# Patient Record
Sex: Female | Born: 1950 | Hispanic: Yes | Smoking: Current every day smoker
Health system: Southern US, Community
[De-identification: ages and names within clinical notes are randomized; demographics above are authoritative.]

## PROBLEM LIST (undated history)

## (undated) DIAGNOSIS — E669 Obesity, unspecified: Secondary | ICD-10-CM

## (undated) DIAGNOSIS — Z972 Presence of dental prosthetic device (complete) (partial): Secondary | ICD-10-CM

## (undated) DIAGNOSIS — I10 Essential (primary) hypertension: Secondary | ICD-10-CM

## (undated) DIAGNOSIS — D492 Neoplasm of unspecified behavior of bone, soft tissue, and skin: Secondary | ICD-10-CM

## (undated) DIAGNOSIS — D72829 Elevated white blood cell count, unspecified: Secondary | ICD-10-CM

## (undated) DIAGNOSIS — E785 Hyperlipidemia, unspecified: Secondary | ICD-10-CM

## (undated) HISTORY — PX: OOPHORECTOMY: SHX86

## (undated) HISTORY — DX: Elevated white blood cell count, unspecified: D72.829

## (undated) HISTORY — DX: Essential (primary) hypertension: I10

## (undated) HISTORY — PX: ABDOMINAL HYSTERECTOMY: SHX81

## (undated) HISTORY — DX: Hyperlipidemia, unspecified: E78.5

---

## 2001-05-17 HISTORY — PX: TOTAL VAGINAL HYSTERECTOMY: SHX2548

## 2004-09-14 HISTORY — PX: COLONOSCOPY: SHX174

## 2005-09-28 LAB — HM COLONOSCOPY: HM Colonoscopy: NORMAL

## 2009-10-04 ENCOUNTER — Ambulatory Visit: Payer: Self-pay | Admitting: Family Medicine

## 2009-10-04 DIAGNOSIS — I10 Essential (primary) hypertension: Secondary | ICD-10-CM

## 2009-10-05 LAB — CONVERTED CEMR LAB
ALT: 15 units/L (ref 0–35)
AST: 14 units/L (ref 0–37)
Albumin: 4.8 g/dL (ref 3.5–5.2)
Alkaline Phosphatase: 83 units/L (ref 39–117)
BUN: 12 mg/dL (ref 6–23)
Basophils Absolute: 0 10*3/uL (ref 0.0–0.1)
Basophils Relative: 0 % (ref 0–1)
CO2: 22 meq/L (ref 19–32)
Calcium: 9.6 mg/dL (ref 8.4–10.5)
Chloride: 105 meq/L (ref 96–112)
Cholesterol: 249 mg/dL — ABNORMAL HIGH (ref 0–200)
Creatinine, Ser: 0.66 mg/dL (ref 0.40–1.20)
Eosinophils Absolute: 0.2 10*3/uL (ref 0.0–0.7)
Eosinophils Relative: 2 % (ref 0–5)
Glucose, Bld: 92 mg/dL (ref 70–99)
HCT: 40 % (ref 36.0–46.0)
HDL: 42 mg/dL (ref >39)
Hemoglobin: 13.3 g/dL (ref 12.0–15.0)
LDL Cholesterol: 166 mg/dL — ABNORMAL HIGH (ref 0–99)
Lymphocytes Relative: 25 % (ref 12–46)
Lymphs Abs: 3.3 10*3/uL (ref 0.7–4.0)
MCHC: 33.3 g/dL (ref 30.0–36.0)
MCV: 85.3 fL (ref 78.0–100.0)
Monocytes Absolute: 0.9 10*3/uL (ref 0.1–1.0)
Monocytes Relative: 7 % (ref 3–12)
Neutro Abs: 8.5 10*3/uL — ABNORMAL HIGH (ref 1.7–7.7)
Neutrophils Relative %: 66 % (ref 43–77)
Platelets: 337 10*3/uL (ref 150–400)
Potassium: 3.8 meq/L (ref 3.5–5.3)
RBC: 4.69 M/uL (ref 3.87–5.11)
RDW: 15.8 % — ABNORMAL HIGH (ref 11.5–15.5)
Sodium: 139 meq/L (ref 135–145)
TSH: 1.325 microintl units/mL (ref 0.350–4.500)
Total Bilirubin: 0.3 mg/dL (ref 0.3–1.2)
Total CHOL/HDL Ratio: 5.9
Total Protein: 8 g/dL (ref 6.0–8.3)
Triglycerides: 206 mg/dL — ABNORMAL HIGH (ref <150)
VLDL: 41 mg/dL — ABNORMAL HIGH (ref 0–40)
WBC: 12.9 10*3/uL — ABNORMAL HIGH (ref 4.0–10.5)

## 2010-01-27 ENCOUNTER — Ambulatory Visit: Payer: Self-pay | Admitting: Internal Medicine

## 2010-01-27 DIAGNOSIS — D72829 Elevated white blood cell count, unspecified: Secondary | ICD-10-CM | POA: Insufficient documentation

## 2010-01-27 DIAGNOSIS — E785 Hyperlipidemia, unspecified: Secondary | ICD-10-CM | POA: Insufficient documentation

## 2010-01-27 LAB — CONVERTED CEMR LAB
Cholesterol, target level: 200 mg/dL
HDL goal, serum: 40 mg/dL
LDL Goal: 130 mg/dL

## 2010-01-29 LAB — CONVERTED CEMR LAB
Basophils Absolute: 0 10*3/uL (ref 0.0–0.1)
Basophils Relative: 0.1 % (ref 0.0–3.0)
Direct LDL: 172.8 mg/dL
Eosinophils Absolute: 0.1 10*3/uL (ref 0.0–0.7)
Eosinophils Relative: 1 % (ref 0.0–5.0)
HCT: 39 % (ref 36.0–46.0)
Hemoglobin: 13.2 g/dL (ref 12.0–15.0)
Lymphocytes Relative: 12.4 % (ref 12.0–46.0)
Lymphs Abs: 1.7 10*3/uL (ref 0.7–4.0)
MCHC: 33.8 g/dL (ref 30.0–36.0)
MCV: 89.3 fL (ref 78.0–100.0)
Monocytes Absolute: 0.8 10*3/uL (ref 0.1–1.0)
Monocytes Relative: 5.9 % (ref 3.0–12.0)
Neutro Abs: 11.2 10*3/uL — ABNORMAL HIGH (ref 1.4–7.7)
Neutrophils Relative %: 80.6 % — ABNORMAL HIGH (ref 43.0–77.0)
Platelets: 318 10*3/uL (ref 150.0–400.0)
RBC: 4.37 M/uL (ref 3.87–5.11)
RDW: 14.5 % (ref 11.5–14.6)
WBC: 13.9 10*3/uL — ABNORMAL HIGH (ref 4.5–10.5)

## 2010-03-13 ENCOUNTER — Telehealth: Payer: Self-pay | Admitting: Family Medicine

## 2010-04-27 ENCOUNTER — Ambulatory Visit
Admission: RE | Admit: 2010-04-27 | Discharge: 2010-04-27 | Payer: Self-pay | Source: Home / Self Care | Attending: Family Medicine | Admitting: Family Medicine

## 2010-04-27 ENCOUNTER — Other Ambulatory Visit: Payer: Self-pay | Admitting: Family Medicine

## 2010-04-27 LAB — LIPID PANEL
Cholesterol: 230 mg/dL — ABNORMAL HIGH (ref 0–200)
HDL: 41.5 mg/dL (ref >39.00)
Total CHOL/HDL Ratio: 6
Triglycerides: 102 mg/dL (ref 0.0–149.0)
VLDL: 20.4 mg/dL (ref 0.0–40.0)

## 2010-04-27 LAB — CBC WITH DIFFERENTIAL/PLATELET
Basophils Absolute: 0 10*3/uL (ref 0.0–0.1)
Basophils Relative: 0.3 % (ref 0.0–3.0)
Eosinophils Absolute: 0.3 10*3/uL (ref 0.0–0.7)
Eosinophils Relative: 2.2 % (ref 0.0–5.0)
HCT: 40.3 % (ref 36.0–46.0)
Hemoglobin: 13.5 g/dL (ref 12.0–15.0)
Lymphocytes Relative: 24 % (ref 12.0–46.0)
Lymphs Abs: 2.9 10*3/uL (ref 0.7–4.0)
MCHC: 33.6 g/dL (ref 30.0–36.0)
MCV: 87.1 fl (ref 78.0–100.0)
Monocytes Absolute: 0.8 10*3/uL (ref 0.1–1.0)
Monocytes Relative: 6.4 % (ref 3.0–12.0)
Neutro Abs: 8.2 10*3/uL — ABNORMAL HIGH (ref 1.4–7.7)
Neutrophils Relative %: 67.1 % (ref 43.0–77.0)
Platelets: 326 10*3/uL (ref 150.0–400.0)
RBC: 4.62 Mil/uL (ref 3.87–5.11)
RDW: 16.9 % — ABNORMAL HIGH (ref 11.5–14.6)
WBC: 12.3 10*3/uL — ABNORMAL HIGH (ref 4.5–10.5)

## 2010-04-27 LAB — LDL CHOLESTEROL, DIRECT: Direct LDL: 173.5 mg/dL

## 2010-04-28 LAB — HIGH SENSITIVITY CRP: CRP, High Sensitivity: 9.49 mg/L — ABNORMAL HIGH (ref 0.00–5.00)

## 2010-05-01 ENCOUNTER — Other Ambulatory Visit: Payer: Self-pay | Admitting: Family Medicine

## 2010-05-01 ENCOUNTER — Encounter: Payer: Self-pay | Admitting: Family Medicine

## 2010-05-01 ENCOUNTER — Ambulatory Visit
Admission: RE | Admit: 2010-05-01 | Discharge: 2010-05-01 | Payer: Self-pay | Source: Home / Self Care | Attending: Family Medicine | Admitting: Family Medicine

## 2010-05-01 LAB — SEDIMENTATION RATE: Sed Rate: 42 mm/hr — ABNORMAL HIGH (ref 0–22)

## 2010-05-16 NOTE — Assessment & Plan Note (Signed)
Summary: NEW PT TO EST/CLE   Vital Signs:  Patient profile:   60 year old female Height:      60.25 inches Weight:      159.50 pounds Temp:     98.7 degrees F oral Pulse rate:   84 / minute Pulse rhythm:   regular BP sitting:   112 / 68  (left arm) Cuff size:   regular  Vitals Entered By: Selena Batten Dance CMA Duncan Dull) (January 27, 2010 9:45 AM) CC: New patient to establish care, Lipid Management, Hypertension Management   History of Present Illness: CC: new patient  [facility] patient but wants blood done at [facility].  would like admission to [facility] if ever needs to be admitted. needs prescription for meds. told WBC high, cholesterol high (was fasting).  has had hysterectomy and BSO 2003 - for fibroids.  gyn exam done 2 years ago in [facility] told normal.  Last mammogram several years ago 2003, due.  colonoscopy 2007 and normal, told rpt 10 years.  requests flu shot today.  tetanus shot unsure last thinks in 58s, due today.  HTN - on lotrel, well controlled.  Sees Dr. Sherald Hess in [facility] for heart issues.  going to [place] 02/22/2010 to visit family.  Hypertension History:      Positive major cardiovascular risk factors include female age 85 years old or older, hyperlipidemia, and hypertension.  Negative major cardiovascular risk factors include no history of diabetes, negative family history for ischemic heart disease, and non-tobacco-user status.        Further assessment for target organ damage reveals no history of ASHD, stroke/TIA, or peripheral vascular disease.    Lipid Management History:      Positive NCEP/ATP III risk factors include female age 59 years old or older, early menopause without estrogen hormone replacement, and hypertension.  Negative NCEP/ATP III risk factors include non-diabetic, no family history for ischemic heart disease, non-tobacco-user status, no ASHD (atherosclerotic heart disease), no prior stroke/TIA, no peripheral vascular disease, and no history of aortic  aneurysm.     Colonoscopy  Procedure date:  09/28/2005  Findings:       Results: Normal. per patient recall.  had done here.  Comments:      Repeat colonoscopy in 10 years.    Procedures Next Due Date:    Colonoscopy: 09/2015  Current Medications (verified): 1)  Lotrel 10-20 Mg Caps (Amlodipine Besy-Benazepril Hcl) .Marland Kitchen.. 1 By Mouth Once Daily 2)  Healthy Heart  Emul (Coenzyme Q10-Omega 3 Fatty Acd) .... 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  14 siblings total  Review of Systems  The patient denies anorexia, fever, weight loss, weight gain, vision loss, decreased hearing, hoarseness, chest pain, syncope, dyspnea on exertion, peripheral edema, prolonged cough, headaches, hemoptysis, abdominal pain, melena, hematochezia, severe indigestion/heartburn, hematuria, incontinence, muscle weakness, transient blindness, difficulty walking, depression, and breast masses.    Physical Exam  General:  well developed, well nourished, no acute distress Head:  Normocephalic  and atraumatic without obvious abnormalities. No apparent alopecia or balding. Eyes:  No corneal or conjunctival inflammation noted. EOMI. Perrla. Ears:  External ear exam shows no significant lesions or deformities.  Otoscopic examination reveals clear canals, tympanic membranes are intact bilaterally without bulging, retraction, inflammation or discharge. Hearing is grossly  normal bilaterally. Nose:  External nasal examination shows no deformity or inflammation. Nasal mucosa are pink and moist without lesions or exudates. Mouth:  Oral mucosa and oropharynx without lesions or exudates.  Teeth in good repair. Neck:  neck supple,  trachea midline, no masses Lungs:  Normal respiratory effort, chest expands symmetrically. Lungs are clear to auscultation, no crackles or wheezes. Heart:  Normal rate and regular rhythm. S1 and S2 normal without gallop, murmur, click, rub or other extra sounds. Abdomen:  abdomen soft and non-tender without masses, organomegaly or hernias noted. Pulses:  2+ rad pulses Extremities:  no edema Neurologic:  CN grossly intact, station and gait intact Skin:  no obvious rashes or lesions Psych:  pleasant, cooperative.  talkative.  full affect.   Impression & Recommendations:  Problem # 1:  HYPERLIPIDEMIA (ICD-272.4) recheck dLDL as pt has been trying TLC - more oatmeal and beans.  goal for her would be <130.    Labs Reviewed: SGOT: 14 (10/04/2009)   SGPT: 15 (10/04/2009)   HDL:42 (10/04/2009)  LDL:166 (10/04/2009)  Chol:249 (10/04/2009)  Trig:206 (10/04/2009)  Orders: TLB-Cholesterol, Direct LDL (83721-DIRLDL)  Problem # 2:  LEUKOCYTOSIS (ICD-288.60) recheck CBC.  likely viral. Orders: TLB-CBC Platelet - w/Differential (85025-CBCD)  Problem # 3:  ESSENTIAL HYPERTENSION, BENIGN (ICD-401.1)  Her updated medication list for this problem includes:    Lotrel 5-10 Mg Caps (Amlodipine besy-benazepril hcl) .Marland Kitchen... Take one daily for blood pressure  BP today: 112/68 Prior BP: 134/77 (10/04/2009)  Labs Reviewed: K+: 3.8 (10/04/2009) Creat: : 0.66 (10/04/2009)   Chol: 249 (10/04/2009)   HDL: 42 (10/04/2009)   LDL: 166 (10/04/2009)   TG: 206 (10/04/2009)  Problem # 4:  Preventive Health Care (ICD-V70.0) Assessment: Deteriorated flu and tetanus today.  UTD colonoscopy - due 2017.  due for mammogram and CPE.  will ask if wants that done  here or at GYN.  try to obtain records from Dr. Manson Passey [facility].  Complete Medication List: 1)  Lotrel 5-10 Mg Caps (Amlodipine besy-benazepril hcl) .... Take one daily for blood pressure 2)  Healthy Heart Emul (Coenzyme q10-omega 3 fatty acd) .... 2 daily  Other Orders: Flu Vaccine 54yrs + (91478) Admin 1st Vaccine (29562) Tdap => 39yrs IM (13086) Admin of Any Addtl Vaccine (57846)  Hypertension Assessment/Plan:      The patient's hypertensive risk group is category B: At least one risk factor (excluding diabetes) with no target organ damage.  Her calculated 10 year risk of coronary heart disease is 9 %.  Today's blood pressure is 112/68.    Lipid Assessment/Plan:      Based on NCEP/ATP III, the patient's risk factor category is "2 or more risk factors and a calculated 10 year CAD risk of < 20%".  The patient's lipid goals are as follows: Total cholesterol goal is 200; LDL cholesterol goal is 130; HDL cholesterol goal is 40; Triglyceride goal is 150.  Her LDL cholesterol goal has not been met.  She has been counseled on adjunctive measures for lowering her cholesterol and has been provided with dietary instructions.     Patient Instructions: 1)  Please return as needed, or in 3-6 months for follow up cholesterol and blood pressure.  2)  Check blood work today (bad cholesterol and CBC) 3)  Release of records from Dr. Manson Passey [facility]. 4)  Check blood pressure on lower dose of medicine and give [place] a call in 2 wks with results.  Goal is <140/90.  if running 130/80s, we may still increase med again. 5)  tetanus shot today. 6)  flu shot today. 7)  Good to meet you, call clinic with questions. Prescriptions: LOTREL 5-10 MG CAPS (AMLODIPINE BESY-BENAZEPRIL HCL) take one daily for blood pressure  #30 x 0   Entered and Authorized by:   Eustaquio Boyden  MD   Signed by:   Eustaquio Boyden  MD on 01/27/2010   Method used:   [facility] to        [facility]  [address] [id number]  (retail)       [address]       [place], [place]  [address]       Ph: [phone] or [phone]       Fax: [phone]   RxID:   [id number]   Current Allergies (reviewed today): No known allergies    Immunizations Administered:  Influenza Vaccine # 1:    Vaccine Type: Fluvax 3+    Site: right deltoid    Mfr: GlaxoSmithKline    Dose: 0.5 ml    Route: IM    Given by: Selena Batten Dance CMA (AAMA)    Exp. Date: 10/14/2010    Lot #: [id number]    VIS given: 11/08/09 version given January 27, 2010.  Tetanus Vaccine:    Vaccine Type: Tdap    Site: left deltoid    Mfr: GlaxoSmithKline    Dose: 0.5 ml    Route: IM    Given by: Selena Batten Dance CMA (AAMA)    Exp. Date: 02/03/2012    Lot #: [id number]    VIS given: 03/03/08 version given January 27, 2010.  Flu Vaccine Consent Questions:    Do you have a history of severe allergic reactions to this vaccine? no    Any prior history of allergic reactions to egg and/or gelatin? no    Do you have a sensitivity to the preservative Thimersol? no    Do you have a past history of Guillan-Barre Syndrome? no    Do you currently have an acute febrile illness? no    Have you ever had a severe reaction to latex? no    Vaccine information given and explained to patient? yes    Are you currently pregnant? no    Prevention & Chronic Care Immunizations   Influenza vaccine: Fluvax 3+  (01/27/2010)   Influenza vaccine due: 12/16/2010    Tetanus booster: 01/27/2010: Tdap   Tetanus booster due: 01/28/2020    Pneumococcal vaccine: Not documented  Colorectal Screening   Hemoccult: Not documented    Colonoscopy:  Results: Normal. per patient recall.  had done here.  (09/28/2005)   Colonoscopy action/deferral: Repeat colonoscopy in 10 years.    (09/28/2005)   Colonoscopy due: 09/2015  Other Screening   Pap smear: Not documented    Mammogram: Not documented   Smoking status: never  (10/04/2009)  Lipids   Total Cholesterol: 249   (10/04/2009)   LDL: 166  (10/04/2009)   LDL Direct: Not documented   HDL: 42  (10/04/2009)   Triglycerides: 206  (10/04/2009)    SGOT (AST): 14  (10/04/2009)   SGPT (ALT): 15  (10/04/2009)   Alkaline phosphatase: 83  (10/04/2009)   Total bilirubin: 0.3  (10/04/2009)  Hypertension   Last Blood Pressure: 112 / 68  (01/27/2010)   Serum creatinine: 0.66  (10/04/2009)   Serum potassium 3.8  (10/04/2009)  Self-Management Support :    Hypertension self-management support: Not documented    Lipid self-management support: Not documented

## 2010-05-16 NOTE — Progress Notes (Signed)
Summary: regarding BP medicine  Phone Note Call from Patient Call back at Home Phone [phone]   Caller: Patient Call For: Eustaquio Boyden  MD Summary of Call: Pt called to let you know that when she decreased her lotrel dose her BP went up, up to 160/100.  She started back taking the 10/20 mg dose, and that is what she wants to continue on.  She is almost out of 10/20 dose and is asking if she can double up on the 5/10, or maybe take one twice a day.  She will need a new [facility] for 10/[facility]. Initial call taken by: Lowella Petties CMA, AAMA,  March 13, 2010 4:39 PM  Follow-up for Phone Call        may double on 5/10.  will send new script for 10/20.  please call and inform Follow-up by: Eustaquio Boyden  MD,  March 13, 2010 4:41 PM  Additional Follow-up for Phone Call Additional follow up Details #1::        Patient notified.  Additional Follow-up by: Melody Comas,  March 13, 2010 4:46 PM    New/Updated Medications: LOTREL 10-20 MG CAPS (AMLODIPINE BESY-BENAZEPRIL HCL) one daily for blood pressure Prescriptions: LOTREL 10-20 MG CAPS (AMLODIPINE BESY-BENAZEPRIL HCL) one daily for blood pressure  #90 x 1   Entered and Authorized by:   Eustaquio Boyden  MD   Signed by:   Eustaquio Boyden  MD on 03/13/2010   Method used:   Electronically to        [facility] [address] [facility] ([facility])       [facility] [place] [facility]       [address]       [place], [place]  [address]       Ph: [phone]       Fax: [phone]   RxID:   [id number]

## 2010-05-16 NOTE — Assessment & Plan Note (Signed)
Summary: PHYSICAL FOR BLOOD PRESSURE MEDS/EVM   Vital Signs:  Patient Profile:   60 Years Old Female CC:      General Medical Evaluation / RWT Height:     60 inches Weight:      161 pounds BMI:     31.56 O2 Sat:      98 % O2 treatment:    Room Air Temp:     97.5 degrees F oral Pulse rate:   88 / minute Pulse rhythm:   regular Resp:     20 per minute BP sitting:   134 / 77  (left arm)  Pt. in pain?   no  Vitals Entered By: Levonne Spiller EMT-P (October 04, 2009 12:46 PM)              Is Patient Diabetic? No Comments Post smoker x 2 years.      Current Allergies: No known allergies History of Present Illness History from: patient Reason for visit: see chief complaint Chief Complaint: General Medical Evaluation / RWT History of Present Illness: Patient has high blood pressure that has been controlled, but she hasn't seen a physician in 2 years, by her report.  She says that her physician "diappeared" and that she did not want to go back as she got angry with them. She has enough meds to last about 4 months, but she would like baseline blood work.   REVIEW OF SYSTEMS Constitutional Symptoms       Complains of weight gain.     Denies fever, chills, night sweats, weight loss, and fatigue.      Comments: 10 lbs in last year Eyes       Complains of contact lenses.      Denies change in vision, eye pain, eye discharge, glasses, and eye surgery. Ear/Nose/Throat/Mouth       Denies hearing loss/aids, change in hearing, ear pain, ear discharge, dizziness, frequent runny nose, frequent nose bleeds, sinus problems, sore throat, hoarseness, and tooth pain or bleeding.  Respiratory       Denies dry cough, productive cough, wheezing, shortness of breath, asthma, bronchitis, and emphysema/COPD.  Cardiovascular       Denies murmurs, chest pain, and tires easily with exhertion.    Gastrointestinal       Denies stomach pain, nausea/vomiting, diarrhea, constipation, blood in bowel movements,  and indigestion. Genitourniary       Denies painful urination, kidney stones, and loss of urinary control. Neurological       Denies paralysis, seizures, and fainting/blackouts. Musculoskeletal       Denies muscle pain, joint pain, joint stiffness, decreased range of motion, redness, swelling, muscle weakness, and gout.  Skin       Denies bruising, unusual mles/lumps or sores, and hair/skin or nail changes.  Psych       Denies mood changes, temper/anger issues, anxiety/stress, speech problems, depression, and sleep problems.  Past History:  Past Medical History: Hypertension  Past Surgical History: Hysterectomy (05/2001) Oophorectomy (05/2001)  Family History: Mother: D84 - pneumonia Father: D4 - bleeding ulcer Brother: D22 - CVA Brother: D73 - medication overdose Sister: D47 - Lupus Sister: D73 - CVA Sister: D ? - Lupus  Social History: Occupation: Works at [employer] Never Smoked Alcohol use-no Smoking Status:  never Physical Exam General appearance: well developed, well nourished, no acute distress Eyes: conjunctivae and lids normal Pupils: equal, round, reactive to light Ears: normal, no lesions or deformities Oral/Pharynx: tongue normal, posterior pharynx without erythema or exudate Neck:  neck supple,  trachea midline, no masses Chest/Lungs: no rales, wheezes, or rhonchi bilateral, breath sounds equal without effort Heart: regular rate and  rhythm, no murmur Abdomen: soft, non-tender without obvious organomegaly Extremities: trace LE edema to just about the ankles bilaterally Neurological: grossly intact and non-focal Skin: no obvious rashes or lesions MSE: talkative, fast but not pressured, with signs of some paranoia Assessment New Problems: HYPERTENSION (ICD-401.9) FAMILY HISTORY OF OTHER CARDIOVASCULAR DISEASES (ICD-V17.49) ESSENTIAL HYPERTENSION, BENIGN (ICD-401.1)   Plan New Orders: T-CBC w/Diff [[id number]] T-Comprehensive Metabolic Panel  [80053-22900] T-TSH [09811-91478] T-Lipid Profile [29562-13086] New Patient Level II [99202]  The patient and/or caregiver has been counseled thoroughly with regard to medications prescribed including dosage, schedule, interactions, rationale for use, and possible side effects and they verbalize understanding.  Diagnoses and expected course of recovery discussed and will return if not improved as expected or if the condition worsens. Patient and/or caregiver verbalized understanding.   Orders Added: 1)  T-CBC w/Diff [[id number]] 2)  T-Comprehensive Metabolic Panel [80053-22900] 3)  T-TSH [28413-24401] 4)  T-Lipid Profile [02725-36644] 5)  New Patient Level II [03474]  The risks, benefits and possible side effects of the treatments and tests were explained clearly to the patient and the patient verbalized understanding.  The patient was informed that there is no on-call provider or services available at this clinic during off-hours (when the clinic is closed).  If the patient developed a problem or concern that required immediate attention, the patient was advised to go the the nearest available urgent care or emergency department for medical care.  The patient verbalized understanding.     The patient has been informed that he/she needs to obtain a primary care physician for completeness and better continuity of care. We are a conveinent care facility and not a primary care office.

## 2010-05-18 NOTE — Assessment & Plan Note (Signed)
Summary: ROA FOR 3-6 MONTH FOLLOW-UP/JRR   Vital Signs:  Patient profile:   60 year old female Height:      60.25 inches Weight:      157.25 pounds Temp:     98.4 degrees F oral Pulse rate:   86 / minute Pulse rhythm:   regular BP sitting:   130 / 70  (left arm) Cuff size:   regular  Vitals Entered By: Selena Batten Dance CMA (AAMA) (April 27, 2010 8:02 AM) CC: 3 month follow up   History of Present Illness: CC: 75mo f/u  1. HLD - has been eating more tuna, oatmeal.  lost 2 lbs.  2. HTN - on lotrel 10/20 and doing well with this.  no HA, vision changes, chest pain, tightness, SOB, urinary changes, LE swelling, no dizziness.  doesn't check at home.  strong family history of CVA.  3. 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  also ? tooth infection.  No fevers/chills.  no joint pains.  no abd pain.  had fall last year on ice.  sister died after fall in bathroom.  post menopausal.  h/o blood transfusions in past 2/2 fibroid bleed prior to hysterectomy.  colonoscopy - 2007? told looking ok, rpt 10 years.  still awaiting records will ask to sign release form for them. doesnt believe in mammograms  Current Medications (verified): 1)  Lotrel 10-20 Mg Caps (Amlodipine Besy-Benazepril Hcl) .... One Daily For Blood Pressure 2)  Healthy Heart  Emul (Coenzyme Q10-Omega 3 Fatty Acd) .... 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  14 siblings total  Past Medical History: Hypertension HLD leukocytosis?  Review of Systems       per HPI  Physical Exam  General:  well developed, well nourished, no acute distress Head:  Normocephalic and atraumatic without obvious abnormalities. No apparent alopecia or balding. Eyes:  No corneal or  conjunctival inflammation noted. EOMI. Perrla. Mouth:  Oral mucosa and oropharynx without lesions or exudates.  upper dentures.  lower teeth without evidence abscess/infection Neck:  neck supple,  trachea midline, no masses, no bruits, no LAD Lungs:  Normal respiratory effort, chest expands symmetrically. Lungs are clear to auscultation, no crackles or wheezes. Heart:  Normal rate and regular rhythm. S1 and S2 normal without gallop, murmur, click, rub or other extra sounds. Abdomen:  abdomen soft and non-tender without masses, organomegaly or hernias noted.  no abd/renal bruits Pulses:  2+ rad pulses, brisk cap refill Extremities:  no edema Skin:  no obvious rashes or lesions   Impression & Recommendations:  Problem # 1:  LEUKOCYTOSIS (ICD-288.60) recheck today.  if abnl, check CRP/ESR and periph smear.  no reason for elevation in past.  if unrevealing, consider heme referral.  Orders: TLB-CBC Platelet - w/Differential (85025-CBCD)  Problem # 2:  HYPERLIPIDEMIA (ICD-272.4) discussed elevated readings.  pt has changed diet, more oatmeal, more fish.  recheck LDL today.  Orders: TLB-Lipid Panel (80061-LIPID)  Labs Reviewed: SGOT: 14 (10/04/2009)   SGPT: 15 (10/04/2009)  Lipid Goals: Chol Goal: 200 (01/27/2010)   HDL Goal: 40 (01/27/2010)   LDL Goal: 130 (01/27/2010)   TG Goal: 150 (01/27/2010)  Prior 10 Yr Risk Heart Disease: 9 % (01/27/2010)   HDL:42 (10/04/2009)  LDL:166 (10/04/2009)  Chol:249 (10/04/2009)  Trig:206 (  10/04/2009)  Problem # 3:  ESSENTIAL HYPERTENSION, BENIGN (ICD-401.1) good control on med.  continue.  pt states tolerating new dose better as well.  still awaiting records from prior pcp.  Her updated medication list for this problem includes:    Lotrel 10-20 Mg Caps (Amlodipine besy-benazepril hcl) ..... One daily for blood pressure  BP today: 130/70 Prior BP: 112/68 (01/27/2010)  Prior 10 Yr Risk Heart Disease: 9 % (01/27/2010)  Labs Reviewed: K+: 3.8  (10/04/2009) Creat: : 0.66 (10/04/2009)   Chol: 249 (10/04/2009)   HDL: 42 (10/04/2009)   LDL: 166 (10/04/2009)   TG: 206 (10/04/2009)  Complete Medication List: 1)  Lotrel 10-20 Mg Caps (Amlodipine besy-benazepril hcl) .... One daily for blood pressure 2)  Healthy Heart Emul (Coenzyme q10-omega 3 fatty acd) .... One daily  Patient Instructions: 1)  Release of information from Dr. Manson Passey, [facility] and [facility]. [facility] in [place], [place]. 2)  blood work today to check on cholesterol and white count. 3)  Consider going to dentist. 4)  Return in 3-6 months for follow up blood pressure and white count. 5)  Good to see you today. Prescriptions: LOTREL 10-20 MG CAPS (AMLODIPINE BESY-BENAZEPRIL HCL) one daily for blood pressure  #90 x 1   Entered and Authorized by:   Eustaquio Boyden  MD   Signed by:   Eustaquio Boyden  MD on 04/27/2010   Method used:   Electronically to        [facility]  [address] (retail)       [address], [address]       [place], [place]  [address]       Ph: [phone]       Fax: [phone]   RxID:   [id number]    Orders Added: 1)  TLB-CBC Platelet - w/Differential [85025-CBCD] 2)  TLB-Lipid Panel [80061-LIPID] 3)  Est. Patient Level IV [72536]    Current Allergies (reviewed today): No known allergies   Appended Document: ROA FOR 3-6 MONTH FOLLOW-UP/JRR    Clinical Lists Changes  Observations: Added new observation of PEADULT: Eustaquio Boyden  MD ~Abdomen`Abdomen exam (04/27/2010 12:17) Added new observation of ABDOMEN EXAM: abdomen soft and non-tender without masses, organomegaly or hernias noted.  no abd/renal bruits.  no splenomegaly. (04/27/2010 12:17)        Physical Exam  Abdomen:  abdomen soft and non-tender without masses, organomegaly or hernias noted.  no abd/renal bruits.  no splenomegaly.   no fevers/chills, NS, weight changes recetnly.  (+2lb weight  loss, trying).  Appended Document: ROA FOR 3-6 MONTH FOLLOW-UP/JRR    Clinical Lists Changes  Observations: Added new observation of PEADULT: Eustaquio Boyden  MD ~Heart`Heart exam (04/27/2010 12:19) Added new observation of HEART EXAM: Normal rate and regular rhythm. S1 and S2 normal.  + 2/6 SEM best at RUSB (h/o PFO) (04/27/2010 12:19) Added new observation of PAST MED HX: Hypertension HLD leukocytosis? h/o PFO (04/27/2010 12:19)        Past History:  Past Medical History: Hypertension HLD leukocytosis? h/o PFO   Physical Exam  Heart:  Normal rate and regular rhythm. S1 and S2 normal.  + 2/6 SEM best at RUSB (h/o PFO)

## 2010-08-01 ENCOUNTER — Other Ambulatory Visit: Payer: Self-pay | Admitting: *Deleted

## 2010-08-01 MED ORDER — AMLODIPINE BESY-BENAZEPRIL HCL 10-20 MG PO CAPS: 1.0000 | ORAL_CAPSULE | Freq: Every day | ORAL | Status: DC

## 2010-10-26 ENCOUNTER — Ambulatory Visit: Payer: Self-pay | Admitting: Family Medicine

## 2010-11-02 ENCOUNTER — Ambulatory Visit: Payer: Self-pay | Admitting: Family Medicine

## 2011-01-31 ENCOUNTER — Encounter: Payer: Self-pay | Admitting: Family Medicine

## 2011-02-01 ENCOUNTER — Encounter: Payer: Self-pay | Admitting: Family Medicine

## 2011-02-02 ENCOUNTER — Encounter: Payer: Self-pay | Admitting: Family Medicine

## 2011-02-02 ENCOUNTER — Ambulatory Visit (INDEPENDENT_AMBULATORY_CARE_PROVIDER_SITE_OTHER): Payer: Managed Care, Other (non HMO) | Admitting: Family Medicine

## 2011-02-02 VITALS — BP 124/74 | HR 72 | Temp 99.1°F | Ht 61.0 in | Wt 164.8 lb

## 2011-02-02 DIAGNOSIS — H6190 Disorder of external ear, unspecified, unspecified ear: Secondary | ICD-10-CM

## 2011-02-02 DIAGNOSIS — Z23 Encounter for immunization: Secondary | ICD-10-CM

## 2011-02-02 DIAGNOSIS — E785 Hyperlipidemia, unspecified: Secondary | ICD-10-CM

## 2011-02-02 DIAGNOSIS — Z0000 Encounter for general adult medical examination without abnormal findings: Secondary | ICD-10-CM

## 2011-02-02 DIAGNOSIS — F17200 Nicotine dependence, unspecified, uncomplicated: Secondary | ICD-10-CM | POA: Insufficient documentation

## 2011-02-02 DIAGNOSIS — I10 Essential (primary) hypertension: Secondary | ICD-10-CM

## 2011-02-02 LAB — BASIC METABOLIC PANEL WITH GFR
BUN: 9 mg/dL (ref 6–23)
CO2: 26 meq/L (ref 19–32)
Calcium: 9.1 mg/dL (ref 8.4–10.5)
Chloride: 105 meq/L (ref 96–112)
Creatinine, Ser: 0.5 mg/dL (ref 0.4–1.2)
GFR: 134.26 mL/min
Glucose, Bld: 100 mg/dL — ABNORMAL HIGH (ref 70–99)
Potassium: 4 meq/L (ref 3.5–5.1)
Sodium: 141 meq/L (ref 135–145)

## 2011-02-02 LAB — CBC WITH DIFFERENTIAL/PLATELET
Basophils Absolute: 0 10*3/uL (ref 0.0–0.1)
Basophils Relative: 0.4 % (ref 0.0–3.0)
HCT: 40.2 % (ref 36.0–46.0)
Hemoglobin: 13.2 g/dL (ref 12.0–15.0)
Lymphs Abs: 2.9 10*3/uL (ref 0.7–4.0)
MCHC: 32.9 g/dL (ref 30.0–36.0)
Monocytes Relative: 7.4 % (ref 3.0–12.0)
Neutro Abs: 7.7 10*3/uL (ref 1.4–7.7)
RDW: 14.8 % — ABNORMAL HIGH (ref 11.5–14.6)

## 2011-02-02 LAB — HEMOGLOBIN A1C: Hgb A1c MFr Bld: 6.5 % (ref 4.6–6.5)

## 2011-02-02 LAB — LIPID PANEL
Cholesterol: 223 mg/dL — ABNORMAL HIGH (ref 0–200)
Total CHOL/HDL Ratio: 6

## 2011-02-02 NOTE — Patient Instructions (Addendum)
Flu shot today. Breast exam today. Blood work today. Pass by Marion's office for referral to dermatologist for evaluation of ear. Good to see you today, call us with questions.

## 2011-02-02 NOTE — Assessment & Plan Note (Signed)
Reviewed preventative protocols and updated. Flu shot today. UTD tetanus. UTD colonoscopy 2007, have requested records today.  Sent home with iFOB per pt preference. Declines mammo.  clinical breast exam today reassuring, but did discuss that mammograms pick up breast cancer prior to detectable on CBE. Blood work today as fasting.  Will need to fill out form for insurance and fax once blood work received.

## 2011-02-02 NOTE — Assessment & Plan Note (Addendum)
Per pt enlarging. Doesn't seem like abscess, not consistent with epidermoid cyst or keloid. Refer to derm for further evaluation.

## 2011-02-02 NOTE — Progress Notes (Signed)
Subjective:    Patient ID: Haley Mayo, female    DOB: 1950-07-03, 60 y.o.   MRN: [id number]  HPI CC: CPE today  Restarted smoking recently - tough year.  Taking courses, moved.    Left earlobe lesion - per pt enlarging, present for years.  Would like removed.  Not tender.  DOESN'T WANT LABS DONE IN [facility].  Preventative: colonoscopy - 2007? told looking ok, rpt 10 years. still awaiting records, signed release form for them today.  doesnt believe in mammograms.  Would like CBE today. No paps - had hysterectomy 2003 for benign reason. UTD tetanus. Would like flu shot today.  Medications and allergies reviewed and updated in chart.  Past histories reviewed and updated if relevant as below. Patient Active Problem List  Diagnoses  . HYPERLIPIDEMIA  . LEUKOCYTOSIS  . ESSENTIAL HYPERTENSION, BENIGN   Past Medical History  Diagnosis Date  . HTN (hypertension)   . HLD (hyperlipidemia)   . Leukocytosis     ?    Past Surgical History  Procedure Date  . Total vaginal hysterectomy 2/03    heavy bleeding, fibroids   History  Substance Use Topics  . Smoking status: Current Everyday Smoker -- 30 years  . Smokeless tobacco: Never Used   Comment: Quit in 2009--restarted  . Alcohol Use: No   Family History  Problem Relation Age of Onset  . Pneumonia Mother   . Ulcers Father   . Stroke Brother   . Lupus Sister   . Stroke Sister     hemorrhage after fall  . Lupus Sister   . Rheum arthritis Sister     ?  . Diabetes Sister   . Other Sister     peritoneal dialysis   No Known Allergies Current Outpatient Prescriptions on File Prior to Visit  Medication Sig Dispense Refill  . amLODipine-benazepril (LOTREL) 10-20 MG per capsule Take 1 capsule by mouth daily.  90 capsule  3  . Coenzyme Q10-Omega 3 Fatty Acd (HEALTHY HEART) EMUL Take 1 capsule by mouth daily.         Review of Systems  Constitutional: Negative for fever, chills, activity change, appetite change,  fatigue and unexpected weight change.  HENT: Negative for hearing loss and neck pain.   Eyes: Negative for visual disturbance.  Respiratory: Negative for cough, chest tightness, shortness of breath and wheezing.   Cardiovascular: Negative for chest pain, palpitations and leg swelling.  Gastrointestinal: Negative for nausea, vomiting, abdominal pain, diarrhea, constipation, blood in stool and abdominal distention.  Genitourinary: Negative for hematuria and difficulty urinating.  Musculoskeletal: Negative for myalgias and arthralgias.  Skin: Negative for rash.  Neurological: Negative for dizziness, seizures, syncope and headaches.  Hematological: Does not bruise/bleed easily.  Psychiatric/Behavioral: Negative for dysphoric mood. The patient is not nervous/anxious.        Objective:   Physical Exam  Nursing note and vitals reviewed. Constitutional: She is oriented to person, place, and time. She appears well-developed and well-nourished. No distress.  HENT:  Head: Normocephalic and atraumatic.  Right Ear: Hearing, tympanic membrane, external ear and ear canal normal.  Left Ear: Hearing, tympanic membrane and ear canal normal.  Nose: Nose normal.  Mouth/Throat: Oropharynx is clear and moist. No oropharyngeal exudate.       Left posterior to pinna with swollen fluctuant mass, nontender, no erythema.  Eyes: Conjunctivae and EOM are normal. Pupils are equal, round, and reactive to light. No scleral icterus.  Neck: Normal range of motion. Neck supple.  Cardiovascular:  Normal rate, regular rhythm, normal heart sounds and intact distal pulses.   No murmur heard. Pulses:      Radial pulses are 2+ on the right side, and 2+ on the left side.  Pulmonary/Chest: Effort normal and breath sounds normal. No respiratory distress. She has no wheezes. She has no rales. Right breast exhibits no inverted nipple, no mass, no nipple discharge, no skin change and no tenderness. Left breast exhibits no inverted  nipple, no mass, no nipple discharge, no skin change and no tenderness. Breasts are symmetrical.  Abdominal: Soft. Bowel sounds are normal. She exhibits no distension and no mass. There is no tenderness. There is no rebound and no guarding.  Musculoskeletal: Normal range of motion. She exhibits no edema.  Lymphadenopathy:    She has no cervical adenopathy.    She has no axillary adenopathy.       Right: No supraclavicular adenopathy present.       Left: No supraclavicular adenopathy present.  Neurological: She is alert and oriented to person, place, and time.       CN grossly intact, station and gait intact  Skin: Skin is warm and dry. No rash noted.  Psychiatric: She has a normal mood and affect. Her behavior is normal. Judgment and thought content normal.      Assessment & Plan:

## 2011-02-02 NOTE — Assessment & Plan Note (Signed)
Restarted encouraged cessation.

## 2011-02-12 ENCOUNTER — Encounter: Payer: Self-pay | Admitting: Family Medicine

## 2011-02-23 ENCOUNTER — Other Ambulatory Visit: Payer: Managed Care, Other (non HMO)

## 2011-02-27 ENCOUNTER — Telehealth: Payer: Self-pay | Admitting: *Deleted

## 2011-02-27 NOTE — Telephone Encounter (Signed)
Patient wanted results. Of iFOB. No results in system. She said she mailed it in around October 24th. Could you check on the status of it, please?

## 2011-02-27 NOTE — Telephone Encounter (Signed)
Haley Mayo at the [employer] gave me a verbal result of, NEGATIVE for the ifob. Delay in entering results due to computer issues.Results should be entered in next 24 hrs.

## 2011-02-28 ENCOUNTER — Other Ambulatory Visit: Payer: Self-pay | Admitting: Family Medicine

## 2011-02-28 ENCOUNTER — Encounter: Payer: Self-pay | Admitting: *Deleted

## 2011-02-28 DIAGNOSIS — Z1211 Encounter for screening for malignant neoplasm of colon: Secondary | ICD-10-CM

## 2011-02-28 NOTE — Telephone Encounter (Signed)
Will await final results.

## 2011-06-28 ENCOUNTER — Other Ambulatory Visit: Payer: Self-pay

## 2011-06-28 MED ORDER — AMLODIPINE BESY-BENAZEPRIL HCL 10-20 MG PO CAPS: 1.0000 | ORAL_CAPSULE | Freq: Every day | ORAL | Status: DC

## 2011-06-28 NOTE — Telephone Encounter (Signed)
[facility] faxed refill request Lotrel 10-20 mg #90 x 3.

## 2011-08-03 ENCOUNTER — Ambulatory Visit: Payer: Managed Care, Other (non HMO) | Admitting: Family Medicine

## 2011-08-03 DIAGNOSIS — Z0289 Encounter for other administrative examinations: Secondary | ICD-10-CM

## 2012-11-11 ENCOUNTER — Other Ambulatory Visit: Payer: Self-pay

## 2012-11-11 MED ORDER — AMLODIPINE BESY-BENAZEPRIL HCL 10-20 MG PO CAPS: 1.0000 | ORAL_CAPSULE | Freq: Every day | ORAL | Status: DC

## 2012-11-11 NOTE — Telephone Encounter (Signed)
Pt has moved to [facility] and has not gotten established with doctor there and request refill amlodipine benazepril; pt has been stretching her med and has 7 generic Lotrel left; pt request refill to [facility] [phone]. Pt request cb.

## 2012-11-11 NOTE — Telephone Encounter (Signed)
plz phone in refill but remind pt will need to establish with local doc there for more refills.

## 2012-11-11 NOTE — Telephone Encounter (Addendum)
Pt left v/m requesting refill of med; pt presently in [place]. Pt last seen 02/02/11 and no show appt 08/03/11. Left v/m for pt to cb to get name of med and pharmacy if possible to refill.

## 2012-11-13 NOTE — Telephone Encounter (Signed)
Rx called in as directed. Message left notifying patient.  

## 2012-11-13 NOTE — Telephone Encounter (Signed)
Pt called for status of refill. Patient notified as instructed by telephone.

## 2014-08-18 ENCOUNTER — Other Ambulatory Visit: Payer: Self-pay

## 2014-08-25 ENCOUNTER — Ambulatory Visit: Payer: Self-pay | Admitting: Internal Medicine

## 2014-09-01 LAB — BASIC METABOLIC PANEL
BUN: 14 mg/dL (ref 4–21)
CREATININE: 0.7 mg/dL (ref 0.5–1.1)
GLUCOSE: 105 mg/dL
Potassium: 4.3 mmol/L (ref 3.4–5.3)
SODIUM: 141 mmol/L (ref 137–147)

## 2014-09-01 LAB — CBC AND DIFFERENTIAL
HCT: 43 % (ref 36–46)
Hemoglobin: 14.2 g/dL (ref 12.0–16.0)
NEUTROS ABS: 8 /uL
Platelets: 340 10*3/uL (ref 150–399)
WBC: 11 10^3/mL

## 2014-09-01 LAB — LIPID PANEL
Cholesterol: 220 mg/dL — AB (ref 0–200)
HDL: 42 mg/dL (ref 35–70)
LDL CALC: 137 mg/dL
TRIGLYCERIDES: 204 mg/dL — AB (ref 40–160)

## 2014-09-01 LAB — HEPATIC FUNCTION PANEL
ALK PHOS: 96 U/L (ref 25–125)
ALT: 12 U/L (ref 7–35)
AST: 14 U/L (ref 13–35)
BILIRUBIN, TOTAL: 0.3 mg/dL

## 2014-09-08 ENCOUNTER — Ambulatory Visit: Payer: Self-pay | Admitting: Internal Medicine

## 2014-09-15 ENCOUNTER — Ambulatory Visit: Payer: Self-pay | Admitting: Internal Medicine

## 2014-09-22 ENCOUNTER — Ambulatory Visit: Payer: Self-pay | Admitting: Specialist

## 2015-02-16 ENCOUNTER — Ambulatory Visit: Payer: Self-pay | Admitting: Internal Medicine

## 2016-01-25 ENCOUNTER — Ambulatory Visit: Payer: Self-pay

## 2016-01-25 ENCOUNTER — Other Ambulatory Visit: Payer: Self-pay

## 2016-01-25 DIAGNOSIS — E785 Hyperlipidemia, unspecified: Secondary | ICD-10-CM

## 2016-01-26 LAB — CBC WITH DIFFERENTIAL/PLATELET
BASOS ABS: 0 10*3/uL (ref 0.0–0.2)
BASOS: 0 %
EOS (ABSOLUTE): 0.2 10*3/uL (ref 0.0–0.4)
Eos: 2 %
Hematocrit: 39.1 % (ref 34.0–46.6)
Hemoglobin: 13.1 g/dL (ref 11.1–15.9)
IMMATURE GRANULOCYTES: 0 %
Immature Grans (Abs): 0 10*3/uL (ref 0.0–0.1)
Lymphocytes Absolute: 2.4 10*3/uL (ref 0.7–3.1)
Lymphs: 21 %
MCH: 27.8 pg (ref 26.6–33.0)
MCHC: 33.5 g/dL (ref 31.5–35.7)
MCV: 83 fL (ref 79–97)
MONOS ABS: 0.9 10*3/uL (ref 0.1–0.9)
Monocytes: 8 %
NEUTROS PCT: 69 %
Neutrophils Absolute: 7.7 10*3/uL — ABNORMAL HIGH (ref 1.4–7.0)
PLATELETS: 306 10*3/uL (ref 150–379)
RBC: 4.72 x10E6/uL (ref 3.77–5.28)
RDW: 15.8 % — AB (ref 12.3–15.4)
WBC: 11.3 10*3/uL — AB (ref 3.4–10.8)

## 2016-01-26 LAB — COMPREHENSIVE METABOLIC PANEL
A/G RATIO: 1.5 (ref 1.2–2.2)
ALT: 18 IU/L (ref 0–32)
AST: 18 IU/L (ref 0–40)
Albumin: 4.9 g/dL — ABNORMAL HIGH (ref 3.6–4.8)
Alkaline Phosphatase: 95 IU/L (ref 39–117)
BILIRUBIN TOTAL: 0.5 mg/dL (ref 0.0–1.2)
BUN/Creatinine Ratio: 15 (ref 12–28)
BUN: 9 mg/dL (ref 8–27)
CALCIUM: 9.3 mg/dL (ref 8.7–10.3)
CHLORIDE: 100 mmol/L (ref 96–106)
CO2: 22 mmol/L (ref 18–29)
Creatinine, Ser: 0.61 mg/dL (ref 0.57–1.00)
GFR calc Af Amer: 112 mL/min/{1.73_m2} (ref >59)
GFR calc non Af Amer: 97 mL/min/{1.73_m2} (ref >59)
GLUCOSE: 97 mg/dL (ref 65–99)
Globulin, Total: 3.2 g/dL (ref 1.5–4.5)
POTASSIUM: 3.9 mmol/L (ref 3.5–5.2)
Sodium: 143 mmol/L (ref 134–144)
Total Protein: 8.1 g/dL (ref 6.0–8.5)

## 2016-01-26 LAB — URINALYSIS
Bilirubin, UA: NEGATIVE
Glucose, UA: NEGATIVE
Ketones, UA: NEGATIVE
LEUKOCYTES UA: NEGATIVE
NITRITE UA: NEGATIVE
PH UA: 6.5 (ref 5.0–7.5)
RBC, UA: NEGATIVE
Specific Gravity, UA: 1.02 (ref 1.005–1.030)
Urobilinogen, Ur: 0.2 mg/dL (ref 0.2–1.0)

## 2016-01-26 LAB — LIPID PANEL
Chol/HDL Ratio: 5 ratio units — ABNORMAL HIGH (ref 0.0–4.4)
Cholesterol, Total: 237 mg/dL — ABNORMAL HIGH (ref 100–199)
HDL: 47 mg/dL (ref >39)
LDL Calculated: 159 mg/dL — ABNORMAL HIGH (ref 0–99)
TRIGLYCERIDES: 154 mg/dL — AB (ref 0–149)
VLDL Cholesterol Cal: 31 mg/dL (ref 5–40)

## 2016-01-26 LAB — TSH: TSH: 2.12 u[IU]/mL (ref 0.450–4.500)

## 2016-01-26 LAB — HEMOGLOBIN A1C
ESTIMATED AVERAGE GLUCOSE: 137 mg/dL
HEMOGLOBIN A1C: 6.4 % — AB (ref 4.8–5.6)

## 2016-02-01 ENCOUNTER — Encounter: Payer: Self-pay | Admitting: Internal Medicine

## 2016-02-01 ENCOUNTER — Ambulatory Visit: Payer: Self-pay | Admitting: Internal Medicine

## 2016-02-01 VITALS — BP 132/73 | HR 85 | Temp 98.2°F | Wt 161.0 lb

## 2016-02-01 DIAGNOSIS — I10 Essential (primary) hypertension: Secondary | ICD-10-CM

## 2016-02-01 NOTE — Progress Notes (Signed)
   Subjective:    Patient ID: Haley Mayo, female    DOB: 06-13-1950, 65 y.o.   MRN: [id number]  HPI   Pt f/u for hypertension. BP is at target at 132/73.   Patient Active Problem List   Diagnosis Date Noted  . Healthcare maintenance 02/02/2011  . Smoking 02/02/2011  . 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 Hyperlipemia 01/27/2010  . LEUKOCYTOSIS 01/27/2010  . Essential hypertension, benign 10/04/2009     Medication List       Accurate as of 02/01/16  9:41 AM. Always use your most recent med list.          amLODipine-benazepril 10-20 MG capsule Commonly known as:  LOTREL Take 1 capsule by mouth daily.   amLODipine-benazepril 10-20 MG capsule Commonly known as:  LOTREL TK 1 C PO QD   HEALTHY HEART Emul Take 1 capsule by mouth daily.        Review of Systems     Objective:   Physical Exam  Constitutional: She is oriented to person, place, and time.  Cardiovascular: Normal rate, regular rhythm and normal heart sounds.   Pulmonary/Chest: Effort normal and breath sounds normal.  Neurological: She is alert and oriented to person, place, and time.    BP 132/73   Pulse 85   Temp 98.2 F (36.8 C) (Oral)   Wt 161 lb (73 kg)   BMI 30.42 kg/m        Assessment & Plan:   Pt advised to schedule mammogram but refused.   F/u in 1 year w/ labs: Met C, CBC, Lipid

## 2016-02-01 NOTE — Patient Instructions (Addendum)
F/u in 1 year w/ labs: Met C, CBC, Lipid

## 2016-02-08 ENCOUNTER — Other Ambulatory Visit: Payer: Self-pay

## 2016-02-15 ENCOUNTER — Ambulatory Visit: Payer: Self-pay

## 2016-12-27 ENCOUNTER — Telehealth: Payer: Self-pay

## 2016-12-27 NOTE — Telephone Encounter (Signed)
Called to reschedule appt on 01/02/2017. Number not in service.

## 2017-01-02 ENCOUNTER — Other Ambulatory Visit: Payer: Self-pay

## 2017-01-16 ENCOUNTER — Ambulatory Visit: Payer: Self-pay | Admitting: Internal Medicine

## 2017-07-26 ENCOUNTER — Telehealth: Payer: Self-pay | Admitting: Internal Medicine

## 2017-07-26 NOTE — Telephone Encounter (Signed)
Is there a way we can change her schedule since her template doesn't reflect such a slot.

## 2017-07-26 NOTE — Telephone Encounter (Unsigned)
Copied from [place]. Topic: Appointment Scheduling - New Patient >> Jul 26, 2017  1:33 PM Hewitt Shorts wrote: CRM for notification. See Telephone encounter for: 07/26/17. Pt is wanting to have dr Aundra Dubin as her PCP and has medicare which a welcome to medicare is 61min and I do not see any thing   Best number [phone]

## 2017-07-26 NOTE — Telephone Encounter (Signed)
No , we will have to make that appointment an hour or make it a 45 min visit and block the 15 that is left.

## 2017-08-02 ENCOUNTER — Telehealth: Payer: Self-pay | Admitting: Internal Medicine

## 2017-08-02 NOTE — Telephone Encounter (Signed)
Copied from [place]. Topic: Appointment Scheduling - New Patient >> Jul 26, 2017  1:33 PM Hewitt Shorts wrote: CRM for notification. See Telephone encounter for: 07/26/17. Pt is wanting to have dr Aundra Dubin as her PCP and has medicare which a welcome to medicare is 65min and I do not see any thing   Best number [phone] >> Aug 02, 2017  2:57 PM Cleaster Corin, NT wrote: Pt. Calling back to check status of appt.

## 2017-08-05 NOTE — Telephone Encounter (Signed)
Please advise 

## 2017-08-05 NOTE — Telephone Encounter (Signed)
Dr. Aundra Dubin are you willing to except this patient .

## 2017-08-05 NOTE — Telephone Encounter (Signed)
Ok thanks please sch when available time

## 2017-08-06 NOTE — Telephone Encounter (Signed)
I tried to reach the patient her voicemail box has not been set up .

## 2017-08-27 ENCOUNTER — Other Ambulatory Visit: Payer: Self-pay | Admitting: Nurse Practitioner

## 2017-08-27 DIAGNOSIS — Z1231 Encounter for screening mammogram for malignant neoplasm of breast: Secondary | ICD-10-CM

## 2017-09-04 ENCOUNTER — Telehealth: Payer: Self-pay | Admitting: *Deleted

## 2017-09-04 NOTE — Telephone Encounter (Signed)
Received referral for low dose lung cancer screening CT scan.  Attempted to leave message at phone number listed in EMR for patient to call me back to facilitate scheduling scan. However, this option is not available. Will attempt at later date.

## 2017-09-11 ENCOUNTER — Other Ambulatory Visit: Payer: Self-pay | Admitting: Nurse Practitioner

## 2017-09-11 DIAGNOSIS — Z13820 Encounter for screening for osteoporosis: Secondary | ICD-10-CM

## 2017-09-24 ENCOUNTER — Encounter: Payer: Self-pay | Admitting: Anesthesiology

## 2017-09-25 ENCOUNTER — Other Ambulatory Visit: Payer: Self-pay

## 2017-09-27 ENCOUNTER — Encounter
Admission: RE | Disposition: A | Discharge status: Home / Self Care | Payer: Self-pay | Source: Ambulatory Visit | Attending: Unknown Physician Specialty

## 2017-09-27 ENCOUNTER — Ambulatory Visit
Admission: RE | Admit: 2017-09-27 | Discharge: 2017-09-27 | Disposition: A | Discharge status: Home / Self Care | Payer: Medicare Other | Source: Ambulatory Visit | Attending: Unknown Physician Specialty | Admitting: Unknown Physician Specialty

## 2017-09-27 DIAGNOSIS — I10 Essential (primary) hypertension: Secondary | ICD-10-CM | POA: Diagnosis not present

## 2017-09-27 DIAGNOSIS — F17200 Nicotine dependence, unspecified, uncomplicated: Secondary | ICD-10-CM | POA: Diagnosis not present

## 2017-09-27 DIAGNOSIS — L723 Sebaceous cyst: Secondary | ICD-10-CM | POA: Insufficient documentation

## 2017-09-27 DIAGNOSIS — Z79899 Other long term (current) drug therapy: Secondary | ICD-10-CM | POA: Insufficient documentation

## 2017-09-27 DIAGNOSIS — E7800 Pure hypercholesterolemia, unspecified: Secondary | ICD-10-CM | POA: Diagnosis not present

## 2017-09-27 HISTORY — DX: Obesity, unspecified: E66.9

## 2017-09-27 HISTORY — DX: Presence of dental prosthetic device (complete) (partial): Z97.2

## 2017-09-27 HISTORY — PX: EAR CYST EXCISION: SHX22

## 2017-09-27 HISTORY — DX: Neoplasm of unspecified behavior of bone, soft tissue, and skin: D49.2

## 2017-09-27 SURGERY — EXCISION, CYST, EAR
Anesthesia: General | Site: Ear | Laterality: Left | Wound class: "Clean "

## 2017-09-27 MED ORDER — CEPHALEXIN 500 MG PO CAPS: 500.0000 mg | ORAL_CAPSULE | Freq: Three times a day (TID) | ORAL | 0 refills | Status: DC

## 2017-09-27 MED ORDER — LIDOCAINE-EPINEPHRINE 1 %-1:100000 IJ SOLN
INTRAMUSCULAR | Status: DC | PRN
  Administered 2017-09-27: 7 mL

## 2017-09-27 MED ORDER — BACITRACIN-NEOMYCIN-POLYMYXIN 400-5-5000 EX OINT
TOPICAL_OINTMENT | CUTANEOUS | Status: DC | PRN
  Administered 2017-09-27: 1 via TOPICAL

## 2017-09-27 SURGICAL SUPPLY — 19 items
APPLICATOR COTTON TIP WD 3 STR (MISCELLANEOUS) ×2 IMPLANT
CORD BIP STRL DISP 12FT (MISCELLANEOUS) ×2 IMPLANT
DRAPE HEAD BAR (DRAPES) ×2 IMPLANT
GAUZE SPONGE 4X4 12PLY STRL (GAUZE/BANDAGES/DRESSINGS) ×2 IMPLANT
GLOVE BIO SURGEON STRL SZ7.5 (GLOVE) ×6 IMPLANT
KIT TURNOVER KIT A (KITS) ×3 IMPLANT
MARKER SKIN DUAL TIP RULER LAB (MISCELLANEOUS) ×2 IMPLANT
NDL HYPO 25GX1X1/2 BEV (NEEDLE) ×1 IMPLANT
NEEDLE HYPO 25GX1X1/2 BEV (NEEDLE) ×3 IMPLANT
NS IRRIG 500ML POUR BTL (IV SOLUTION) ×3 IMPLANT
PACK DRAPE NASAL/ENT (PACKS) ×3 IMPLANT
SOL PREP PVP 2OZ (MISCELLANEOUS) ×3
SOLUTION PREP PVP 2OZ (MISCELLANEOUS) IMPLANT
SPONGE XRAY 4X4 16PLY STRL (MISCELLANEOUS) ×2 IMPLANT
STRAP BODY AND KNEE 60X3 (MISCELLANEOUS) ×3 IMPLANT
SUT PROLENE 4 0 PS 2 18 (SUTURE) ×2 IMPLANT
SUT VIC AB 4-0 RB1 18 (SUTURE) ×2 IMPLANT
SYR 10ML LL (SYRINGE) ×3 IMPLANT
TOWEL OR 17X26 4PK STRL BLUE (TOWEL DISPOSABLE) ×3 IMPLANT

## 2017-09-27 NOTE — OR Nursing (Signed)
BP           P           Sat            R  0936                  144/85         99 97%        17  0941               137/76       105          99%        17  0945                  132/73        98 98%        17  0950     132/75        94 97%        16  0955    127/73        96           97%        16

## 2017-09-27 NOTE — Op Note (Signed)
09/27/2017  10:01 AM    Haley Mayo  [id number]   Pre-Op Dx: EAR LOBE CYST  Post-op Dx: SAME  Proc: Excision postauricular cyst left earlobe  Surg:  Roena Malady  Anes:  GOT  EBL: Less than 5 cc  Comp: None  Findings: 2.5 cm cystic mass posterior aspect of the earlobe on the left  Procedure: Haley Mayo was taken to the operating room and placed in supine position.  The left ear was prepped and draped sterilely.  Local anesthetic of 1% lidocaine with 1 100,000 units of epinephrine was used to inject around the postauricular cyst.  A total of 4 cc was used.  The ear was then retracted anteriorly elliptical incision was made around the cystic area down to the cyst wall.  Short sharp scissors were then used to dissect the cyst from the underlying tissue.  This was dissected circumferentially removing the cyst in its entirety.  Hemostasis was achieved using the microbipolar.  Wound was then copiously irrigated with saline.  No active bleeding the subcutaneous tissues were closed using 4-0 Vicryl and the skin was closed using a running 4-0 Prolene.  The excision completed the patient was taken to the recovery room in stable condition.  Cultures: None  Specimens: Postauricular cyst  Dispo:   Good  Plan: Discharge to home follow-up in the office in 10 days  Roena Malady  09/27/2017 10:01 AM

## 2017-09-27 NOTE — H&P (Signed)
The patient's history has been reviewed, patient examined, no change in status, stable for surgery.  Questions were answered to the patients satisfaction.  

## 2017-09-30 ENCOUNTER — Encounter: Payer: Self-pay | Admitting: Unknown Physician Specialty

## 2017-10-01 LAB — SURGICAL PATHOLOGY

## 2017-10-25 ENCOUNTER — Encounter: Payer: Self-pay | Admitting: *Deleted

## 2019-08-18 ENCOUNTER — Other Ambulatory Visit: Payer: Self-pay | Admitting: Family Medicine

## 2019-08-18 DIAGNOSIS — Z1231 Encounter for screening mammogram for malignant neoplasm of breast: Secondary | ICD-10-CM

## 2019-08-18 DIAGNOSIS — Z13820 Encounter for screening for osteoporosis: Secondary | ICD-10-CM

## 2019-08-26 ENCOUNTER — Inpatient Hospital Stay
Admission: EM | Admit: 2019-08-26 | Discharge: 2019-08-31 | DRG: 193 | Disposition: A | Discharge status: Other Acute Inpatient Hospital | Payer: Medicare Other | Attending: Internal Medicine | Admitting: Internal Medicine

## 2019-08-26 ENCOUNTER — Emergency Department: Payer: Medicare Other

## 2019-08-26 ENCOUNTER — Encounter: Payer: Self-pay | Admitting: Emergency Medicine

## 2019-08-26 ENCOUNTER — Other Ambulatory Visit: Payer: Self-pay

## 2019-08-26 DIAGNOSIS — M6282 Rhabdomyolysis: Secondary | ICD-10-CM | POA: Diagnosis present

## 2019-08-26 DIAGNOSIS — Z79899 Other long term (current) drug therapy: Secondary | ICD-10-CM

## 2019-08-26 DIAGNOSIS — E669 Obesity, unspecified: Secondary | ICD-10-CM | POA: Diagnosis present

## 2019-08-26 DIAGNOSIS — E876 Hypokalemia: Secondary | ICD-10-CM | POA: Diagnosis not present

## 2019-08-26 DIAGNOSIS — Z20822 Contact with and (suspected) exposure to covid-19: Secondary | ICD-10-CM | POA: Diagnosis present

## 2019-08-26 DIAGNOSIS — R748 Abnormal levels of other serum enzymes: Secondary | ICD-10-CM

## 2019-08-26 DIAGNOSIS — F17210 Nicotine dependence, cigarettes, uncomplicated: Secondary | ICD-10-CM | POA: Diagnosis present

## 2019-08-26 DIAGNOSIS — R7989 Other specified abnormal findings of blood chemistry: Secondary | ICD-10-CM

## 2019-08-26 DIAGNOSIS — J189 Pneumonia, unspecified organism: Principal | ICD-10-CM | POA: Diagnosis present

## 2019-08-26 DIAGNOSIS — R509 Fever, unspecified: Secondary | ICD-10-CM

## 2019-08-26 DIAGNOSIS — I609 Nontraumatic subarachnoid hemorrhage, unspecified: Secondary | ICD-10-CM

## 2019-08-26 DIAGNOSIS — K7689 Other specified diseases of liver: Secondary | ICD-10-CM | POA: Diagnosis present

## 2019-08-26 DIAGNOSIS — E785 Hyperlipidemia, unspecified: Secondary | ICD-10-CM | POA: Diagnosis present

## 2019-08-26 DIAGNOSIS — Z6831 Body mass index (BMI) 31.0-31.9, adult: Secondary | ICD-10-CM

## 2019-08-26 DIAGNOSIS — R531 Weakness: Secondary | ICD-10-CM

## 2019-08-26 DIAGNOSIS — I10 Essential (primary) hypertension: Secondary | ICD-10-CM | POA: Diagnosis present

## 2019-08-26 LAB — CBC WITH DIFFERENTIAL/PLATELET
Abs Immature Granulocytes: 0.17 10*3/uL — ABNORMAL HIGH (ref 0.00–0.07)
Basophils Absolute: 0 10*3/uL (ref 0.0–0.1)
Basophils Relative: 0 %
Eosinophils Absolute: 0 10*3/uL (ref 0.0–0.5)
Eosinophils Relative: 0 %
HCT: 47.1 % — ABNORMAL HIGH (ref 36.0–46.0)
Hemoglobin: 15.4 g/dL — ABNORMAL HIGH (ref 12.0–15.0)
Immature Granulocytes: 1 %
Lymphocytes Relative: 5 %
Lymphs Abs: 1.3 10*3/uL (ref 0.7–4.0)
MCH: 27.5 pg (ref 26.0–34.0)
MCHC: 32.7 g/dL (ref 30.0–36.0)
MCV: 84.3 fL (ref 80.0–100.0)
Monocytes Absolute: 2 10*3/uL — ABNORMAL HIGH (ref 0.1–1.0)
Monocytes Relative: 7 %
Neutro Abs: 23.6 10*3/uL — ABNORMAL HIGH (ref 1.7–7.7)
Neutrophils Relative %: 87 %
Platelets: 304 10*3/uL (ref 150–400)
RBC: 5.59 MIL/uL — ABNORMAL HIGH (ref 3.87–5.11)
RDW: 15.5 % (ref 11.5–15.5)
WBC: 27.1 10*3/uL — ABNORMAL HIGH (ref 4.0–10.5)
nRBC: 0 % (ref 0.0–0.2)

## 2019-08-26 LAB — URINALYSIS, COMPLETE (UACMP) WITH MICROSCOPIC
Bacteria, UA: NONE SEEN
Bilirubin Urine: NEGATIVE
Glucose, UA: NEGATIVE mg/dL
Hgb urine dipstick: NEGATIVE
Ketones, ur: 20 mg/dL — AB
Leukocytes,Ua: NEGATIVE
Nitrite: NEGATIVE
Protein, ur: 100 mg/dL — AB
Specific Gravity, Urine: 1.023 (ref 1.005–1.030)
pH: 5 (ref 5.0–8.0)

## 2019-08-26 LAB — COMPREHENSIVE METABOLIC PANEL
ALT: 71 U/L — ABNORMAL HIGH (ref 0–44)
AST: 99 U/L — ABNORMAL HIGH (ref 15–41)
Albumin: 4 g/dL (ref 3.5–5.0)
Alkaline Phosphatase: 77 U/L (ref 38–126)
Anion gap: 12 (ref 5–15)
BUN: 28 mg/dL — ABNORMAL HIGH (ref 8–23)
CO2: 23 mmol/L (ref 22–32)
Calcium: 9.4 mg/dL (ref 8.9–10.3)
Chloride: 110 mmol/L (ref 98–111)
Creatinine, Ser: 0.81 mg/dL (ref 0.44–1.00)
GFR calc Af Amer: >60 mL/min (ref >60)
GFR calc non Af Amer: >60 mL/min (ref >60)
Glucose, Bld: 166 mg/dL — ABNORMAL HIGH (ref 70–99)
Potassium: 4.5 mmol/L (ref 3.5–5.1)
Sodium: 145 mmol/L (ref 135–145)
Total Bilirubin: 1.3 mg/dL — ABNORMAL HIGH (ref 0.3–1.2)
Total Protein: 8.4 g/dL — ABNORMAL HIGH (ref 6.5–8.1)

## 2019-08-26 LAB — SARS CORONAVIRUS 2 BY RT PCR (HOSPITAL ORDER, PERFORMED IN ~~LOC~~ HOSPITAL LAB): SARS Coronavirus 2: NEGATIVE

## 2019-08-26 LAB — LACTIC ACID, PLASMA: Lactic Acid, Venous: 1.2 mmol/L (ref 0.5–1.9)

## 2019-08-26 LAB — PROCALCITONIN: Procalcitonin: 0.12 ng/mL

## 2019-08-26 LAB — TROPONIN I (HIGH SENSITIVITY): Troponin I (High Sensitivity): 23 ng/L — ABNORMAL HIGH (ref <18)

## 2019-08-26 LAB — CK: Total CK: 3162 U/L — ABNORMAL HIGH (ref 38–234)

## 2019-08-26 MED ORDER — SODIUM CHLORIDE 0.9 % IV SOLN
2.0000 g | INTRAVENOUS | Status: DC
  Administered 2019-08-26 – 2019-08-29 (×4): 2 g via INTRAVENOUS
  Filled 2019-08-26: qty 2
  Filled 2019-08-26 (×3): qty 20
  Filled 2019-08-26: qty 2

## 2019-08-26 MED ORDER — ENOXAPARIN SODIUM 40 MG/0.4ML ~~LOC~~ SOLN
40.0000 mg | SUBCUTANEOUS | Status: DC
  Administered 2019-08-26 – 2019-08-29 (×3): 40 mg via SUBCUTANEOUS
  Filled 2019-08-26 (×5): qty 0.4

## 2019-08-26 MED ORDER — SODIUM CHLORIDE 0.9 % IV SOLN: INTRAVENOUS | Status: AC

## 2019-08-26 MED ORDER — BENAZEPRIL HCL 20 MG PO TABS
20.0000 mg | ORAL_TABLET | Freq: Every day | ORAL | Status: DC
  Administered 2019-08-27 – 2019-08-30 (×4): 20 mg via ORAL
  Filled 2019-08-26 (×5): qty 1

## 2019-08-26 MED ORDER — SODIUM CHLORIDE 0.9 % IV SOLN
500.0000 mg | INTRAVENOUS | Status: AC
  Administered 2019-08-26 – 2019-08-30 (×5): 500 mg via INTRAVENOUS
  Filled 2019-08-26 (×5): qty 500

## 2019-08-26 MED ORDER — SODIUM CHLORIDE 0.9 % IV SOLN: Freq: Once | INTRAVENOUS | Status: DC

## 2019-08-26 MED ORDER — SODIUM CHLORIDE 0.9 % IV BOLUS
1000.0000 mL | Freq: Once | INTRAVENOUS | Status: AC
  Administered 2019-08-26: 1000 mL via INTRAVENOUS

## 2019-08-26 MED ORDER — AMLODIPINE BESYLATE 10 MG PO TABS
10.0000 mg | ORAL_TABLET | Freq: Every day | ORAL | Status: DC
  Administered 2019-08-27 – 2019-08-30 (×4): 10 mg via ORAL
  Filled 2019-08-26 (×3): qty 1
  Filled 2019-08-26: qty 2

## 2019-08-26 MED ORDER — LACTATED RINGERS IV BOLUS: 1000.0000 mL | Freq: Once | INTRAVENOUS | Status: DC

## 2019-08-26 MED ORDER — IPRATROPIUM-ALBUTEROL 0.5-2.5 (3) MG/3ML IN SOLN
3.0000 mL | Freq: Once | RESPIRATORY_TRACT | Status: AC
  Administered 2019-08-26: 3 mL via RESPIRATORY_TRACT
  Filled 2019-08-26: qty 3

## 2019-08-26 NOTE — ED Provider Notes (Signed)
[facility] Emergency Department Provider Note    First MD Initiated Contact with Patient 08/26/19 1613     (approximate)  I have reviewed the triage vital signs and the nursing notes.   HISTORY  Chief Complaint Weakness    HPI Haley Mayo is a 69 y.o. female presents to the ER after PD called out on well check.  Patient reportedly has not been able to move or get off the ground for the past several days.  She got her maternal Covid shot on the fourth and started feeling muscle aches and weakness 2 days later.  Was having some nausea has been having some cough.  No measured fevers.  States that she has not had anything to eat or drink in several days.  Does take Norvasc but no other medications.  Denies any abdominal pain.  No diarrhea.  No dysuria.    Past Medical History:  Diagnosis Date  . HLD (hyperlipidemia)   . HTN (hypertension)    controlled  . Leukocytosis    ?   Marland Kitchen Neoplasm of ear    left ear lobe  . Obesity   . Wears dentures    upper   Family History  Problem Relation Age of Onset  . Ulcers Father   . Pneumonia Mother   . Diabetes Sister   . Stroke Brother   . Lupus Sister   . Stroke Sister        hemorrhage after fall  . Lupus Sister   . Rheum arthritis Sister        ?  . Diabetes Sister   . Other Sister        peritoneal dialysis   Past Surgical History:  Procedure Laterality Date  . ABDOMINAL HYSTERECTOMY  Age 60  . COLONOSCOPY  09/2004   normal, rpt 10 yrs  . EAR CYST EXCISION Left 09/27/2017   Procedure: EXCISION EAR LOBE CYST;  Surgeon: Beverly Gust, MD;  Location: [facility];  Service: ENT;  Laterality: Left;  LOCAL ONLY  . OOPHORECTOMY    . TOTAL VAGINAL HYSTERECTOMY  05/2001   heavy bleeding, fibroids   Patient Active Problem List   Diagnosis Date Noted  . Community acquired pneumonia 08/26/2019  . Elevated CK 08/26/2019  . Generalized weakness 08/26/2019  . Healthcare maintenance  02/02/2011  . Smoking 02/02/2011  . 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 Hyperlipemia 01/27/2010  . LEUKOCYTOSIS 01/27/2010  . Essential hypertension, benign 10/04/2009      Prior to Admission medications   Medication Sig Start Date End Date Taking? Authorizing Provider  amLODipine-benazepril (LOTREL) 10-20 MG capsule Take 1 capsule by mouth every evening.    Yes [provider]  Multiple Vitamins-Minerals (EMERGEN-C VITAMIN C PO) Take 1 tablet by mouth as directed.   Yes [provider]  Coenzyme Q10-Omega 3 Fatty Acd (HEALTHY HEART) EMUL Take 1 capsule by mouth daily.      [provider]    Allergies Patient has no known allergies.    Social History Social History   Tobacco Use  . Smoking status: Current Every Day Smoker    Packs/day: 1.00    Years: 40.00    Pack years: 40.00    Types: Cigarettes  . Smokeless tobacco: Current User  . Tobacco comment: Quit in 2009--restarted  Substance Use Topics  . Alcohol use: No  . Drug use: No    Review of Systems Patient denies headaches, rhinorrhea, blurry vision, numbness, shortness of breath,  chest pain, edema, cough, abdominal pain, nausea, vomiting, diarrhea, dysuria, fevers, rashes or hallucinations unless otherwise stated above in HPI. ____________________________________________   PHYSICAL EXAM:  VITAL SIGNS: Vitals:   08/26/19 2300 08/26/19 2315  BP: (!) 156/79   Pulse: 93 89  Resp: (!) 28   Temp:    SpO2: 96% 96%    Constitutional: Alert and oriented. Ill appearing Eyes: Conjunctivae are normal.  Head: Atraumatic. Nose: No congestion/rhinnorhea. Mouth/Throat: Mucous membranes are moist.   Neck: No stridor. Painless ROM.  Cardiovascular: Normal rate, regular rhythm. Grossly normal heart sounds.  Good peripheral circulation. Respiratory: Normal respiratory effort.  No retractions. Lungs with coarse bibasilar bs.  Scattered wheeze Gastrointestinal: Soft and nontender. No distention. No  abdominal bruits. No CVA tenderness. Genitourinary: normal external genitalia Musculoskeletal: No lower extremity tenderness nor edema.  No joint effusions. Neurologic:  Normal speech and language. No gross focal neurologic deficits are appreciated. No facial droop Skin:  Skin is warm, dry and intact. No rash noted. Psychiatric: Mood and affect are normal. Speech and behavior are normal.  ____________________________________________   LABS (all labs ordered are listed, but only abnormal results are displayed)  Results for orders placed or performed during the hospital encounter of 08/26/19 (from the past 24 hour(s))  CBC with Differential/Platelet     Status: Abnormal   Collection Time: 08/26/19  4:38 PM  Result Value Ref Range   WBC 27.1 (H) 4.0 - 10.5 K/uL   RBC 5.59 (H) 3.87 - 5.11 MIL/uL   Hemoglobin 15.4 (H) 12.0 - 15.0 g/dL   HCT 47.1 (H) 36.0 - 46.0 %   MCV 84.3 80.0 - 100.0 fL   MCH 27.5 26.0 - 34.0 pg   MCHC 32.7 30.0 - 36.0 g/dL   RDW 15.5 11.5 - 15.5 %   Platelets 304 150 - 400 K/uL   nRBC 0.0 0.0 - 0.2 %   Neutrophils Relative % 87 %   Neutro Abs 23.6 (H) 1.7 - 7.7 K/uL   Lymphocytes Relative 5 %   Lymphs Abs 1.3 0.7 - 4.0 K/uL   Monocytes Relative 7 %   Monocytes Absolute 2.0 (H) 0.1 - 1.0 K/uL   Eosinophils Relative 0 %   Eosinophils Absolute 0.0 0.0 - 0.5 K/uL   Basophils Relative 0 %   Basophils Absolute 0.0 0.0 - 0.1 K/uL   Immature Granulocytes 1 %   Abs Immature Granulocytes 0.17 (H) 0.00 - 0.07 K/uL   Tear Drop Cells PRESENT    Polychromasia PRESENT   CK     Status: Abnormal   Collection Time: 08/26/19  4:38 PM  Result Value Ref Range   Total CK 3,162 (H) 38 - 234 U/L  Lactic acid, plasma     Status: None   Collection Time: 08/26/19  4:38 PM  Result Value Ref Range   Lactic Acid, Venous 1.2 0.5 - 1.9 mmol/L  Comprehensive metabolic panel     Status: Abnormal   Collection Time: 08/26/19  4:38 PM  Result Value Ref Range   Sodium 145 135 - 145  mmol/L   Potassium 4.5 3.5 - 5.1 mmol/L   Chloride 110 98 - 111 mmol/L   CO2 23 22 - 32 mmol/L   Glucose, Bld 166 (H) 70 - 99 mg/dL   BUN 28 (H) 8 - 23 mg/dL   Creatinine, Ser 0.81 0.44 - 1.00 mg/dL   Calcium 9.4 8.9 - 10.3 mg/dL   Total Protein 8.4 (H) 6.5 - 8.1 g/dL   Albumin  4.0 3.5 - 5.0 g/dL   AST 99 (H) 15 - 41 U/L   ALT 71 (H) 0 - 44 U/L   Alkaline Phosphatase 77 38 - 126 U/L   Total Bilirubin 1.3 (H) 0.3 - 1.2 mg/dL   GFR calc non Af Amer >60 >60 mL/min   GFR calc Af Amer >60 >60 mL/min   Anion gap 12 5 - 15  Urinalysis, Complete w Microscopic     Status: Abnormal   Collection Time: 08/26/19  4:38 PM  Result Value Ref Range   Color, Urine YELLOW (A) YELLOW   APPearance CLOUDY (A) CLEAR   Specific Gravity, Urine 1.023 1.005 - 1.030   pH 5.0 5.0 - 8.0   Glucose, UA NEGATIVE NEGATIVE mg/dL   Hgb urine dipstick NEGATIVE NEGATIVE   Bilirubin Urine NEGATIVE NEGATIVE   Ketones, ur 20 (A) NEGATIVE mg/dL   Protein, ur 100 (A) NEGATIVE mg/dL   Nitrite NEGATIVE NEGATIVE   Leukocytes,Ua NEGATIVE NEGATIVE   RBC / HPF 0-5 0 - 5 RBC/hpf   WBC, UA 6-10 0 - 5 WBC/hpf   Bacteria, UA NONE SEEN NONE SEEN   Squamous Epithelial / LPF 0-5 0 - 5   Mucus PRESENT    Hyaline Casts, UA PRESENT   Troponin I (High Sensitivity)     Status: Abnormal   Collection Time: 08/26/19  4:38 PM  Result Value Ref Range   Troponin I (High Sensitivity) 23 (H) <18 ng/L  Procalcitonin     Status: None   Collection Time: 08/26/19  4:38 PM  Result Value Ref Range   Procalcitonin 0.12 ng/mL  SARS Coronavirus 2 by RT PCR (hospital order, performed in [place] hospital lab) Nasopharyngeal Nasopharyngeal Swab     Status: None   Collection Time: 08/26/19  5:23 PM   Specimen: Nasopharyngeal Swab  Result Value Ref Range   SARS Coronavirus 2 NEGATIVE NEGATIVE   ____________________________________________  EKG My review and personal interpretation at Time: 16:19   Indication: weakness  Rate: 90  Rhythm:  sinus Axis: normal Other: normal intervals, no stemi,  Occasional pvc ____________________________________________  RADIOLOGY  I personally reviewed all radiographic images ordered to evaluate for the above acute complaints and reviewed radiology reports and findings.  These findings were personally discussed with the patient.  Please see medical record for radiology report.  ____________________________________________   PROCEDURES  Procedure(s) performed:  Procedures    Critical Care performed: no ____________________________________________   INITIAL IMPRESSION / ASSESSMENT AND PLAN / ED COURSE  Pertinent labs & imaging results that were available during my care of the patient were reviewed by me and considered in my medical decision making (see chart for details).   DDX: Electrolyte abnormality, anemia, sepsis, pneumonia, UTI, cholelithiasis, cholecystitis, medication reaction, rhabdo  Keirston I Arciniega is a 69 y.o. who presents to the ED with symptoms as described above.  Patient is weak and frail appearing reportedly being on the ground for several days with decreased p.o. intake.  Blood will be sent for the but differential.  Will order IV fluids.  The patient will be placed on continuous pulse oximetry and telemetry for monitoring.  Laboratory evaluation will be sent to evaluate for the above complaints.     Clinical Course as of Aug 25 2348  Wed Aug 26, 2019  1849 Blood work does show evidence for rhabdo with infiltrates on chest x-ray with some shortness of breath with significant leukocytosis will cover for community-acquired pneumonia.  We will continue with IV  hydration.  Will discuss with hospitalist for admission.   [PR]    Clinical Course User Index [PR] Merlyn Lot, MD    The patient was evaluated in Emergency Department today for the symptoms described in the history of present illness. He/she was evaluated in the context of the global COVID-19  pandemic, which necessitated consideration that the patient might be at risk for infection with the SARS-CoV-2 virus that causes COVID-19. Institutional protocols and algorithms that pertain to the evaluation of patients at risk for COVID-19 are in a state of rapid change based on information released by regulatory bodies including the CDC and federal and state organizations. These policies and algorithms were followed during the patient's care in the ED.  As part of my medical decision making, I reviewed the following data within the [facility] notes reviewed and incorporated, Labs reviewed, notes from prior ED visits and [place] Controlled Substance Database   ____________________________________________   FINAL CLINICAL IMPRESSION(S) / ED DIAGNOSES  Final diagnoses:  Elevated LFTs  Non-traumatic rhabdomyolysis  Community acquired pneumonia, unspecified laterality      NEW MEDICATIONS STARTED DURING THIS VISIT:  New Prescriptions   No medications on file     Note:  This document was prepared using Dragon voice recognition software and may include unintentional dictation errors.    Merlyn Lot, MD 08/26/19 2350

## 2019-08-26 NOTE — ED Triage Notes (Signed)
Pt arrived from home via [facility] after police were called by son for well check. Police found pt in floor. Pt reports that she had been on the floor for 8 days after she got really weak from receiving COVID vaccine 2 days prior. Son reports last speaking to pt 6th of May. Pt was hypertensive for EMS 192/67 and CBG was 167.

## 2019-08-26 NOTE — ED Notes (Signed)
Admitting provider at bedside.

## 2019-08-26 NOTE — ED Notes (Signed)
Family updated as to patient's status. Spoke w/ pt's son Harrell Gave.

## 2019-08-26 NOTE — ED Notes (Signed)
Pt provided w/ drink on request w/ EDP permission.

## 2019-08-26 NOTE — ED Notes (Signed)
This tech and Mitch,RN provided pt with pericare and applied external urinary catheter and dry brief. UA collected and sent to lab. Warm blankets provided. 2x rails up.

## 2019-08-26 NOTE — ED Notes (Signed)
Provided pt w/ phone to call her son Harrell Gave.

## 2019-08-26 NOTE — H&P (Signed)
History and Physical    Haley Mayo [id number]:[id number] DOB: October 18, 1950 DOA: 08/26/2019  PCP: Frazier Richards, MD  Patient coming from: Home  I have personally briefly reviewed patient's old medical records in [place]  Chief Complaint: Generalized weakness, down at home for prolonged time  HPI: Haley Mayo is a 69 y.o. female with medical history significant for hypertension who presents to the ED for evaluation of generalized weakness.  Patient states she received the first dose of [place] COVID-19 vaccination on 08/18/2019.  She says 3 days later she was working outside for about 20 minutes when she began to feel unwell.  She went inside and had nausea with emesis.  She is felt extremely weak/lethargic and tried taking a shower without improvement.  She says she has been lying on the floor since that time due to her weakness.  She says she has not fallen but was scared of falling when asked why she has been lying on the floor rather than getting into bed.  She did not lose consciousness.  She denies any chest pain or dyspnea.  She has had decreased oral intake.  She denies any abdominal pain, dysuria, or diarrhea.  Per ED triage documentation police were called by her son for well check and she was found on the floor.  She is noted to be hypertensive with BP 192/67 and CBG 167.  ED Course:  Initial vitals showed BP 159/82, pulse 88, RR 24, temp 98.2 Fahrenheit, SPO2 97% on 2 L supplemental O2 via Gas. Per EDP O2 saturation was 90% on room air.  Labs are notable for WBC 27.1, hemoglobin 15.4, platelets 304,000, sodium 145, potassium 4.5, bicarb 23, BUN 28, creatinine 0.81, serum glucose 166, AST 99, ALT 71, alk phos 77, total bilirubin 1.3 (hemolysis noted), high-sensitivity troponin I 23, lactic acid 1.2, procalcitonin 0.12.  CK 3162.  Urinalysis shows negative nitrates, negative leukocytes, 0-5 RBC/hpf, 6-10 WBC/hpf, no bacteria microscopy.  SARS-CoV-2 PCR test is  negative.  Portable chest x-ray shows mild bibasilar atelectasis and/or infiltrate without evidence of pleural effusion or pneumothorax.  RUQ ultrasound shows changes compatible hepatic steatosis, small simple appearing hepatic cysts.  CBD diameter is 2.8 mm, no gallstones or wall thickening of the gallbladder noted.  Patient was given 1 L normal saline and IV ceftriaxone/azithromycin.  The hospitalist service was consulted to admit for further evaluation and management.  Review of Systems: All systems reviewed and are negative except as documented in history of present illness above.   Past Medical History:  Diagnosis Date  . HLD (hyperlipidemia)   . HTN (hypertension)    controlled  . Leukocytosis    ?   Marland Kitchen Neoplasm of ear    left ear lobe  . Obesity   . Wears dentures    upper    Past Surgical History:  Procedure Laterality Date  . ABDOMINAL HYSTERECTOMY  Age 18  . COLONOSCOPY  09/2004   normal, rpt 10 yrs  . EAR CYST EXCISION Left 09/27/2017   Procedure: EXCISION EAR LOBE CYST;  Surgeon: Beverly Gust, MD;  Location: [facility];  Service: ENT;  Laterality: Left;  LOCAL ONLY  . OOPHORECTOMY    . TOTAL VAGINAL HYSTERECTOMY  05/2001   heavy bleeding, fibroids    Social History:  reports that she has been smoking cigarettes. She has a 40.00 pack-year smoking history. She uses smokeless tobacco. She reports that she does not drink alcohol or use drugs.  No Known Allergies  Family History  Problem Relation Age of Onset  . Ulcers Father   . Pneumonia Mother   . Diabetes Sister   . Stroke Brother   . Lupus Sister   . Stroke Sister        hemorrhage after fall  . Lupus Sister   . Rheum arthritis Sister        ?  . Diabetes Sister   . Other Sister        peritoneal dialysis     Prior to Admission medications   Medication Sig Start Date End Date Taking? Authorizing Provider  amLODipine-benazepril (LOTREL) 10-20 MG capsule TK 1 C PO QD/ pm 01/25/16    [provider]  amLODipine-benazepril (LOTREL) 10-20 MG per capsule Take 1 capsule by mouth daily. 11/11/12 11/11/13  Ria Bush, MD  cephALEXin (KEFLEX) 500 MG capsule Take 1 capsule (500 mg total) by mouth 3 (three) times daily. 09/27/17   Beverly Gust, MD  Coenzyme Q10-Omega 3 Fatty Acd (HEALTHY HEART) EMUL Take 1 capsule by mouth daily.      [provider]    Physical Exam: Vitals:   08/26/19 1624 08/26/19 1625  BP: (!) 159/82   Pulse: 83   Resp: (!) 24   Temp: 98.2 F (36.8 C)   TempSrc: Oral   SpO2: 97%   Weight:  73.5 kg  Height:  5' (1.524 m)   Constitutional: Obese woman resting supine in bed, NAD, calm, comfortable Eyes: PERRL, lids and conjunctivae normal ENMT: Mucous membranes are dry. Posterior pharynx clear of any exudate or lesions. Neck: normal, supple, no masses. Respiratory: clear to auscultation bilaterally, no wheezing, no crackles. Normal respiratory effort. No accessory muscle use.  Cardiovascular: Regular rate and rhythm, systolic murmur present. No extremity edema. 2+ pedal pulses. Abdomen: no tenderness, no masses palpated. No hepatosplenomegaly. Bowel sounds positive.  Musculoskeletal: no clubbing / cyanosis. No joint deformity upper and lower extremities. Good ROM, no contractures. Normal muscle tone.  Skin: no rashes, lesions, ulcers. No induration Neurologic: CN 2-12 grossly intact. Sensation intact, Strength 5/5 in all 4.  Psychiatric: Normal judgment and insight. Alert and oriented x 3. Normal mood.     Labs on Admission: I have personally reviewed following labs and imaging studies  CBC: Recent Labs  Lab 08/26/19 1638  WBC 27.1*  NEUTROABS 23.6*  HGB 15.4*  HCT 47.1*  MCV 84.3  PLT 893   Basic Metabolic Panel: Recent Labs  Lab 08/26/19 1638  NA 145  K 4.5  CL 110  CO2 23  GLUCOSE 166*  BUN 28*  CREATININE 0.81  CALCIUM 9.4   GFR: Estimated Creatinine Clearance: 60.3 mL/min (by C-G formula based on  SCr of 0.81 mg/dL). Liver Function Tests: Recent Labs  Lab 08/26/19 1638  AST 99*  ALT 71*  ALKPHOS 77  BILITOT 1.3*  PROT 8.4*  ALBUMIN 4.0   No results for input(s): LIPASE, AMYLASE in the last 168 hours. No results for input(s): AMMONIA in the last 168 hours. Coagulation Profile: No results for input(s): INR, PROTIME in the last 168 hours. Cardiac Enzymes: Recent Labs  Lab 08/26/19 1638  CKTOTAL 3,162*   BNP (last 3 results) No results for input(s): PROBNP in the last 8760 hours. HbA1C: No results for input(s): HGBA1C in the last 72 hours. CBG: No results for input(s): GLUCAP in the last 168 hours. Lipid Profile: No results for input(s): CHOL, HDL, LDLCALC, TRIG, CHOLHDL, LDLDIRECT in the last 72 hours. Thyroid Function Tests: No results for  input(s): TSH, T4TOTAL, FREET4, T3FREE, THYROIDAB in the last 72 hours. Anemia Panel: No results for input(s): VITAMINB12, FOLATE, FERRITIN, TIBC, IRON, RETICCTPCT in the last 72 hours. Urine analysis:    Component Value Date/Time   COLORURINE YELLOW (A) 08/26/2019 1638   APPEARANCEUR CLOUDY (A) 08/26/2019 1638   APPEARANCEUR Clear 01/25/2016 1045   LABSPEC 1.023 08/26/2019 1638   PHURINE 5.0 08/26/2019 1638   GLUCOSEU NEGATIVE 08/26/2019 1638   HGBUR NEGATIVE 08/26/2019 [place] 08/26/2019 1638   BILIRUBINUR Negative 01/25/2016 1045   KETONESUR 20 (A) 08/26/2019 1638   PROTEINUR 100 (A) 08/26/2019 1638   NITRITE NEGATIVE 08/26/2019 1638   LEUKOCYTESUR NEGATIVE 08/26/2019 1638    Radiological Exams on Admission: DG Chest Portable 1 View  Result Date: 08/26/2019 CLINICAL DATA:  Weakness. EXAM: PORTABLE CHEST 1 VIEW COMPARISON:  None. FINDINGS: Mild atelectasis and/or infiltrate is seen within the bilateral lung bases. There is no evidence of a pleural effusion or pneumothorax. The cardiac silhouette is mildly enlarged. The visualized skeletal structures are unremarkable. IMPRESSION: Mild bibasilar  atelectasis and/or infiltrate. Electronically Signed   By: Virgina Norfolk M.D.   On: 08/26/2019 17:05   US ABDOMEN LIMITED RUQ  Result Date: 08/26/2019 CLINICAL DATA:  Elevated LFTs EXAM: ULTRASOUND ABDOMEN LIMITED RIGHT UPPER QUADRANT COMPARISON:  None FINDINGS: Gallbladder: No gallstones or wall thickening visualized. No sonographic Murphy sign noted by sonographer. Common bile duct: Diameter: 2.8 mm, nondilated Liver: Diffusely increased hepatic echogenicity with loss of definition of the portal triads and diminished posterior through transmission compatible with hepatic steatosis. Geographic region of hypoattenuation along the gallbladder fossa is compatible with focal fatty sparing. Anechoic 1.6 x 1.3 x 1 cm cyst in the left lobe liver. Additional 1 x 1.1 x 1 cm cyst in the right lobe liver. No concerning focal lesion. Portal vein is patent on color Doppler imaging with normal direction of blood flow towards the liver. Other: None. IMPRESSION: Diffusely increased hepatic echogenicity most compatible with hepatic steatosis with focal fatty sparing along the gallbladder fossa. Small simple appearing hepatic cysts. No other acute or concerning right upper quadrant abnormality. Electronically Signed   By: Lovena Le M.D.   On: 08/26/2019 18:40    EKG: Independently reviewed. Sinus rhythm with PVC, RAD.  No prior for comparison.  Assessment/Plan Principal Problem:   Community acquired pneumonia Active Problems:   Essential hypertension, benign   Elevated CK   Generalized weakness  Haley Mayo is a 69 y.o. female with medical history significant for hypertension who is admitted with [facility] pneumonia.  Community-acquired pneumonia: Presented with leukocytosis, O2 saturation 90% on room air per EDP, tachypnea, and bibasilar infiltrates on chest x-ray.  Likely predisposed from prolonged downtime. -Continue IV ceftriaxone and azithromycin -Supplemental oxygen as needed -Incentive  spirometer  Elevated CK: Mildly elevated in setting of prolonged downtime. -Continue IV fluid hydration overnight  Hypertension: Currently stable.  Resume home amlodipine-benazepril tomorrow.  Elevated LFTs: Mildly elevated LFTs.  RUQ ultrasound shows changes suggestive of hepatic steatosis and small simple hepatic cyst, otherwise no acute issue.  Recheck labs in a.m.  Generalized weakness: With prolonged downtime at home without reported fall or loss of consciousness. -PT/OT eval  DVT prophylaxis: Lovenox Code Status: Full code, confirmed with patient Family Communication: Discussed with patient, she has discussed with family Disposition Plan: From home, likely discharge to home pending ability to transition oral antibiotics and PT/OT eval Consults called: None Admission status:  Status is: Observation  The patient remains  OBS appropriate and will d/c before 2 midnights.  Dispo: The patient is from: Home              Anticipated d/c is to: Home pending PT/OT eval              Anticipated d/c date is: 1 day pending ability to transition to oral antibiotics              Patient currently is not medically stable to d/c.  Zada Finders MD [facility]  If 7PM-7AM, please contact night-coverage [email or web address]  08/26/2019, 7:01 PM

## 2019-08-27 DIAGNOSIS — K7689 Other specified diseases of liver: Secondary | ICD-10-CM | POA: Diagnosis present

## 2019-08-27 DIAGNOSIS — F17210 Nicotine dependence, cigarettes, uncomplicated: Secondary | ICD-10-CM | POA: Diagnosis present

## 2019-08-27 DIAGNOSIS — I10 Essential (primary) hypertension: Secondary | ICD-10-CM | POA: Diagnosis present

## 2019-08-27 DIAGNOSIS — R748 Abnormal levels of other serum enzymes: Secondary | ICD-10-CM

## 2019-08-27 DIAGNOSIS — Z20822 Contact with and (suspected) exposure to covid-19: Secondary | ICD-10-CM | POA: Diagnosis present

## 2019-08-27 DIAGNOSIS — R531 Weakness: Secondary | ICD-10-CM

## 2019-08-27 DIAGNOSIS — M6282 Rhabdomyolysis: Secondary | ICD-10-CM | POA: Diagnosis present

## 2019-08-27 DIAGNOSIS — Z79899 Other long term (current) drug therapy: Secondary | ICD-10-CM | POA: Diagnosis not present

## 2019-08-27 DIAGNOSIS — J189 Pneumonia, unspecified organism: Secondary | ICD-10-CM | POA: Diagnosis present

## 2019-08-27 DIAGNOSIS — E785 Hyperlipidemia, unspecified: Secondary | ICD-10-CM | POA: Diagnosis present

## 2019-08-27 DIAGNOSIS — E876 Hypokalemia: Secondary | ICD-10-CM

## 2019-08-27 DIAGNOSIS — Z6831 Body mass index (BMI) 31.0-31.9, adult: Secondary | ICD-10-CM | POA: Diagnosis not present

## 2019-08-27 DIAGNOSIS — I609 Nontraumatic subarachnoid hemorrhage, unspecified: Secondary | ICD-10-CM | POA: Diagnosis not present

## 2019-08-27 DIAGNOSIS — E669 Obesity, unspecified: Secondary | ICD-10-CM | POA: Diagnosis present

## 2019-08-27 LAB — COMPREHENSIVE METABOLIC PANEL
ALT: 53 U/L — ABNORMAL HIGH (ref 0–44)
AST: 47 U/L — ABNORMAL HIGH (ref 15–41)
Albumin: 3.3 g/dL — ABNORMAL LOW (ref 3.5–5.0)
Alkaline Phosphatase: 69 U/L (ref 38–126)
Anion gap: 8 (ref 5–15)
BUN: 17 mg/dL (ref 8–23)
CO2: 24 mmol/L (ref 22–32)
Calcium: 8.6 mg/dL — ABNORMAL LOW (ref 8.9–10.3)
Chloride: 112 mmol/L — ABNORMAL HIGH (ref 98–111)
Creatinine, Ser: 0.58 mg/dL (ref 0.44–1.00)
GFR calc Af Amer: >60 mL/min (ref >60)
GFR calc non Af Amer: >60 mL/min (ref >60)
Glucose, Bld: 163 mg/dL — ABNORMAL HIGH (ref 70–99)
Potassium: 2.9 mmol/L — ABNORMAL LOW (ref 3.5–5.1)
Sodium: 144 mmol/L (ref 135–145)
Total Bilirubin: 0.7 mg/dL (ref 0.3–1.2)
Total Protein: 7 g/dL (ref 6.5–8.1)

## 2019-08-27 LAB — CBC
HCT: 40.1 % (ref 36.0–46.0)
Hemoglobin: 13 g/dL (ref 12.0–15.0)
MCH: 27.6 pg (ref 26.0–34.0)
MCHC: 32.4 g/dL (ref 30.0–36.0)
MCV: 85.1 fL (ref 80.0–100.0)
Platelets: 270 10*3/uL (ref 150–400)
RBC: 4.71 MIL/uL (ref 3.87–5.11)
RDW: 15 % (ref 11.5–15.5)
WBC: 22.9 10*3/uL — ABNORMAL HIGH (ref 4.0–10.5)
nRBC: 0 % (ref 0.0–0.2)

## 2019-08-27 LAB — STREP PNEUMONIAE URINARY ANTIGEN: Strep Pneumo Urinary Antigen: NEGATIVE

## 2019-08-27 LAB — HIV ANTIBODY (ROUTINE TESTING W REFLEX): HIV Screen 4th Generation wRfx: NONREACTIVE

## 2019-08-27 LAB — CK: Total CK: 1685 U/L — ABNORMAL HIGH (ref 38–234)

## 2019-08-27 MED ORDER — POTASSIUM CHLORIDE CRYS ER 20 MEQ PO TBCR
40.0000 meq | EXTENDED_RELEASE_TABLET | Freq: Once | ORAL | Status: AC
  Administered 2019-08-27: 40 meq via ORAL
  Filled 2019-08-27: qty 2

## 2019-08-27 MED ORDER — ACETAMINOPHEN 325 MG PO TABS
650.0000 mg | ORAL_TABLET | Freq: Four times a day (QID) | ORAL | Status: DC | PRN
  Administered 2019-08-27 – 2019-08-30 (×7): 650 mg via ORAL
  Filled 2019-08-27 (×8): qty 2

## 2019-08-27 MED ORDER — SODIUM CHLORIDE 0.9 % IV SOLN: INTRAVENOUS | Status: DC

## 2019-08-27 MED ORDER — POTASSIUM CHLORIDE 10 MEQ/100ML IV SOLN
10.0000 meq | INTRAVENOUS | Status: AC
  Administered 2019-08-27 (×2): 10 meq via INTRAVENOUS
  Filled 2019-08-27 (×2): qty 100

## 2019-08-27 NOTE — ED Notes (Signed)
Pt requested to try and use bedpan to urinate. Asks for time to urinate, pt instructed to hit call bell when finished. Call bell within reach.

## 2019-08-27 NOTE — Evaluation (Signed)
Occupational Therapy Evaluation Patient Details Name: Haley Mayo MRN: [id number] DOB: [birth year] Today's Date: 08/27/2019    History of Present Illness Pt is 69 yo female that presented to ED for generalized weakness, pt stated she has been down on the floor at home since 5/7. Reported recent Haley Mayo vaccination (5/3). PMH of HLD, HTN.   Clinical Impression   Haley Mayo was seen for OT evaluation this date. Prior to hospital admission, pt was generally independent in ADL/IADL management including bathing, dressing, and medication management. She does not drive, and uses [employer] transportation for community mobility needs. Pt is former Biochemist, clinical. Pt lives alone in a 1-level home with at least 3 steps to enter and bilateral hand rails. Currently pt demonstrates impairments as described below (See OT problem list) which functionally limit her ability to perform ADL/self-care tasks. Pt currently requires MIN A +2 for STS from elevated surface. MOD A for LB ADL management in seated position, and supervision/set-up for UB ADL management.  Pt would benefit from skilled OT services to address noted impairments and functional limitations (see below for any additional details) in order to maximize safety and independence while minimizing falls risk and caregiver burden. Upon hospital discharge, recommend STR to maximize pt safety and return to PLOF.      Follow Up Recommendations  SNF    Equipment Recommendations  3 in 1 bedside commode    Recommendations for Other Services       Precautions / Restrictions Precautions Precautions: Fall Restrictions Weight Bearing Restrictions: No      Mobility Bed Mobility Overal bed mobility: Needs Assistance Bed Mobility: Supine to Sit;Sit to Supine     Supine to sit: Max assist;HOB elevated Sit to supine: +2 for physical assistance;Mod assist   General bed mobility comments: Pt has difficulty with trunk elevation/lowering this date.  She fatigues quickly with bed mobility and requires therapeutic rest breaks t/o session.  Transfers Overall transfer level: Needs assistance Equipment used: Rolling walker (2 wheeled) Transfers: Sit to/from Stand Sit to Stand: Min assist;+2 safety/equipment;Mod assist         General transfer comment: STS from elevated gurney. Min/MOD +2 to stand. Min/MOD +2 to stand from room commode x2. Pt unable to come to full stand on first attempt. On second attempt is able to come upright given assist/prompting for sequencing.    Balance Overall balance assessment: Needs assistance Sitting-balance support: Feet unsupported;Bilateral upper extremity supported Sitting balance-Leahy Scale: Poor Sitting balance - Comments: Requires CGA to occasional min A to maintain static sitting balance.     Standing balance-Leahy Scale: Fair Standing balance comment: was able to perform static standing briefly without RW support, but safety much improved with RW throughout mobility                           ADL either performed or assessed with clinical judgement   ADL Overall ADL's : Needs assistance/impaired                                       General ADL Comments: Pt is functionally limited by generalized weakness, poor activity tolerance, double vision, and dizziness with functional mobility. She requires +2 MOD A to perform STS from EOB and low commode this date. Close supervision for peri care with seated lateral leans.  +2 CGA/SBA for functional mobility in room with RW.  MOD A for LB ADL managment in seated position.     Vision Baseline Vision/History: Wears glasses Wears Glasses: At all times Patient Visual Report: Diplopia Additional Comments: Diploplia disappears with 1 eye closed. Pt states, seems to come and go.     Perception     Praxis      Pertinent Vitals/Pain Pain Assessment: No/denies pain Faces Pain Scale: Hurts a little bit Pain Location: R lateral  thigh Pain Descriptors / Indicators: Grimacing;Aching Pain Intervention(s): Limited activity within patient's tolerance;Monitored during session;Repositioned     Hand Dominance Right   Extremity/Trunk Assessment Upper Extremity Assessment Upper Extremity Assessment: Generalized weakness(Grossly 4-/5 t/o)   Lower Extremity Assessment Lower Extremity Assessment: Generalized weakness;Defer to PT evaluation RLE Deficits / Details: 3+/5 hip flexion, grossly 4-/5 remainder of testing RLE Sensation: WNL LLE Deficits / Details: 3+/5 hip flexion, grossly 4-/5 remainder of testing LLE Sensation: WNL       Communication Communication Communication: No difficulties   Cognition Arousal/Alertness: Awake/alert Behavior During Therapy: WFL for tasks assessed/performed Overall Cognitive Status: Within Functional Limits for tasks assessed                                 General Comments: Pt alert, oriented, occasional redirection to task. Many questions regarding care coordination and therapy process. Answered as able and within scope of OT practice.   General Comments       Exercises Other Exercises Other Exercises: Pt educated on falls prevention strategies for home and hospital, safe use of RW for functional mobility, role of OT in acute setting. Other Exercises: Toileting using standard commode, minAx2 for safe transfers, supervision for seated pericare. Pt spent several minutes in a seated position, arms supported by RW.   Shoulder Instructions      Home Living Family/patient expects to be discharged to:: Private residence Living Arrangements: Alone Available Help at Discharge: Other (Comment)(Pt reports she has no help nearby. Son lives in [place].) Type of Home: House Home Access: Stairs to enter Technical brewer of Steps: 4 or 3 Entrance Stairs-Rails: Right;Left;Can reach both Home Layout: One level     Bathroom Shower/Tub: Teacher, early years/pre:  Standard     Home Equipment: Grab bars - tub/shower          Prior Functioning/Environment Level of Independence: Independent        Comments: pt does not drive, uses [employer] transportation for groceries/doctors appointments. Otherwise indep for all ADL/IADL management.        OT Problem List: Decreased strength;Decreased coordination;Decreased range of motion;Decreased activity tolerance;Decreased safety awareness;Impaired balance (sitting and/or standing);Decreased knowledge of use of DME or AE      OT Treatment/Interventions: Self-care/ADL training;Therapeutic exercise;Therapeutic activities;DME and/or AE instruction;Patient/family education;Balance training;Energy conservation    OT Goals(Current goals can be found in the care plan section) Acute Rehab OT Goals Patient Stated Goal: to feel better OT Goal Formulation: With patient Time For Goal Achievement: 09/10/19 Potential to Achieve Goals: Good ADL Goals Pt Will Perform Grooming: with modified independence;standing;with adaptive equipment(C LRAD PRN for safety and improved functional independence.) Pt Will Perform Lower Body Dressing: with adaptive equipment;sit to/from stand;with set-up;with supervision(C LRAD PRN for safety and improved functional independence.) Pt Will Transfer to Toilet: regular height toilet;with set-up;with supervision;ambulating(C LRAD PRN for safety and improved functional independence.) Pt Will Perform Toileting - Clothing Manipulation and hygiene: with set-up;with supervision;sit to/from stand;with adaptive equipment(C LRAD PRN for safety and  improved functional independence.)  OT Frequency: Min 1X/week   Barriers to D/C:            Co-evaluation   Reason for Co-Treatment: For patient/therapist safety;To address functional/ADL transfers PT goals addressed during session: Mobility/safety with mobility OT goals addressed during session: ADL's and self-care;Proper use of Adaptive equipment and  DME      AM-PAC OT "6 Clicks" Daily Activity     Outcome Measure Help from another person eating meals?: A Little Help from another person taking care of personal grooming?: A Little Help from another person toileting, which includes using toliet, bedpan, or urinal?: A Little Help from another person bathing (including washing, rinsing, drying)?: A Lot Help from another person to put on and taking off regular upper body clothing?: A Little Help from another person to put on and taking off regular lower body clothing?: A Lot 6 Click Score: 16   End of Session Equipment Utilized During Treatment: Rolling walker;Gait belt Nurse Communication: Mobility status;Other (comment)(pt c/o of urinary urgency/frequency)  Activity Tolerance: Patient tolerated treatment well Patient left: in bed;with call bell/phone within reach;with nursing/sitter in room  OT Visit Diagnosis: Other abnormalities of gait and mobility (R26.89);Muscle weakness (generalized) (M62.81)                Time: [id number] OT Time Calculation (min): 36 min Charges:  OT General Charges $OT Visit: 1 Visit OT Evaluation $OT Eval Moderate Complexity: 1 Mod OT Treatments $Self Care/Home Management : 8-22 mins  Shara Blazing, M.S., OTR/L Ascom: [phone] 08/27/19, 11:26 AM

## 2019-08-27 NOTE — ED Notes (Signed)
Pt eating her breakfast.

## 2019-08-27 NOTE — Progress Notes (Signed)
PROGRESS NOTE    Haley RAGGS  [id number] DOB: 12/05/1950 DOA: 08/26/2019 PCP: Frazier Richards, MD    Brief Narrative:  Haley Mayo is a 69 y.o. female with medical history significant for hypertension who presents to the ED for evaluation of generalized weakness.  Patient states she received the first dose of [place] COVID-19 vaccination on 08/18/2019.  She says 3 days later she was working outside for about 20 minutes when she began to feel unwell.  She went inside and had nausea with emesis.  She is felt extremely weak/lethargic and tried taking a shower without improvement.  She says she has been lying on the floor since that time due to her weakness.  She says she has not fallen but was scared of falling when asked why she has been lying on the floor rather than getting into bed.  She did not lose consciousness.  She denies any chest pain or dyspnea.  She has had decreased oral intake.  She denies any abdominal pain, dysuria, or diarrhea.  Per ED triage documentation police were called by her son for well check and she was found on the floor.  She is noted to be hypertensive with BP 192/67 and CBG 167.    Consultants:     Procedures:   Antimicrobials:   Ceftriaxone and azithromycin   Subjective: *Has chills, no fever or any other complaints.  No abdominal pain or nausea or vomiting  Objective: Vitals:   08/27/19 1200 08/27/19 1230 08/27/19 1300 08/27/19 1417  BP: (!) 163/77 (!) 160/76 (!) 173/83 (!) 157/71  Pulse: 85 87 72 76  Resp: (!) 24 18 (!) 23 16  Temp:    99.2 F (37.3 C)  TempSrc:    Oral  SpO2: 96% 97% 97% 92%  Weight:      Height:        Intake/Output Summary (Last 24 hours) at 08/27/2019 1456 Last data filed at 08/27/2019 1348 Gross per 24 hour  Intake 1550 ml  Output --  Net 1550 ml   Filed Weights   08/26/19 1625  Weight: 73.5 kg    Examination:  General exam: Appears calm and comfortable, NAD looks tired Respiratory system: Clear  to auscultation. Respiratory effort normal.  Wheezing Cardiovascular system: S1 & S2 heard, RRR. No JVD, murmurs, rubs, gallops or clicks.  Gastrointestinal system: Abdomen is nondistended, soft and nontender.. Normal bowel sounds heard. Central nervous system: Alert and oriented.  Grossly intact Extremities: No edema Skin: Warm dry Psychiatry: Judgement and insight appear normal. Mood & affect appropriate.     Data Reviewed: I have personally reviewed following labs and imaging studies  CBC: Recent Labs  Lab 08/26/19 1638 08/27/19 0453  WBC 27.1* 22.9*  NEUTROABS 23.6*  --   HGB 15.4* 13.0  HCT 47.1* 40.1  MCV 84.3 85.1  PLT 304 [id number]   Basic Metabolic Panel: Recent Labs  Lab 08/26/19 1638 08/27/19 0453  NA 145 144  K 4.5 2.9*  CL 110 112*  CO2 23 24  GLUCOSE 166* 163*  BUN 28* 17  CREATININE 0.81 0.58  CALCIUM 9.4 8.6*   GFR: Estimated Creatinine Clearance: 61.1 mL/min (by C-G formula based on SCr of 0.58 mg/dL). Liver Function Tests: Recent Labs  Lab 08/26/19 1638 08/27/19 0453  AST 99* 47*  ALT 71* 53*  ALKPHOS 77 69  BILITOT 1.3* 0.7  PROT 8.4* 7.0  ALBUMIN 4.0 3.3*   No results for input(s): LIPASE, AMYLASE in the last 168  hours. No results for input(s): AMMONIA in the last 168 hours. Coagulation Profile: No results for input(s): INR, PROTIME in the last 168 hours. Cardiac Enzymes: Recent Labs  Lab 08/26/19 1638 08/27/19 0453  CKTOTAL 3,162* 1,685*   BNP (last 3 results) No results for input(s): PROBNP in the last 8760 hours. HbA1C: No results for input(s): HGBA1C in the last 72 hours. CBG: No results for input(s): GLUCAP in the last 168 hours. Lipid Profile: No results for input(s): CHOL, HDL, LDLCALC, TRIG, CHOLHDL, LDLDIRECT in the last 72 hours. Thyroid Function Tests: No results for input(s): TSH, T4TOTAL, FREET4, T3FREE, THYROIDAB in the last 72 hours. Anemia Panel: No results for input(s): VITAMINB12, FOLATE, FERRITIN, TIBC, IRON,  RETICCTPCT in the last 72 hours. Sepsis Labs: Recent Labs  Lab 08/26/19 1638  PROCALCITON 0.12  LATICACIDVEN 1.2    Recent Results (from the past 240 hour(s))  SARS Coronavirus 2 by RT PCR (hospital order, performed in [facility] hospital lab) Nasopharyngeal Nasopharyngeal Swab     Status: None   Collection Time: 08/26/19  5:23 PM   Specimen: Nasopharyngeal Swab  Result Value Ref Range Status   SARS Coronavirus 2 NEGATIVE NEGATIVE Final    Comment: (NOTE) SARS-CoV-2 target nucleic acids are NOT DETECTED. The SARS-CoV-2 RNA is generally detectable in upper and lower respiratory specimens during the acute phase of infection. The lowest concentration of SARS-CoV-2 viral copies this assay can detect is 250 copies / mL. A negative result does not preclude SARS-CoV-2 infection and should not be used as the sole basis for treatment or other patient management decisions.  A negative result may occur with improper specimen collection / handling, submission of specimen other than nasopharyngeal swab, presence of viral mutation(s) within the areas targeted by this assay, and inadequate number of viral copies (<250 copies / mL). A negative result must be combined with clinical observations, patient history, and epidemiological information. Fact Sheet for Patients:   StrictlyIdeas.no Fact Sheet for Healthcare Providers: BankingDealers.co.za This test is not yet approved or cleared  by the [place] FDA and has been authorized for detection and/or diagnosis of SARS-CoV-2 by FDA under an Emergency Use Authorization (EUA).  This EUA will remain in effect (meaning this test can be used) for the duration of the COVID-19 declaration under Section 564(b)(1) of the Act, 21 U.S.C. section 360bbb-3(b)(1), unless the authorization is terminated or revoked sooner. Performed at [facility], [address] [place], [place] [address]           Radiology Studies: DG Chest Portable 1 View  Result Date: 08/26/2019 CLINICAL DATA:  Weakness. EXAM: PORTABLE CHEST 1 VIEW COMPARISON:  None. FINDINGS: Mild atelectasis and/or infiltrate is seen within the bilateral lung bases. There is no evidence of a pleural effusion or pneumothorax. The cardiac silhouette is mildly enlarged. The visualized skeletal structures are unremarkable. IMPRESSION: Mild bibasilar atelectasis and/or infiltrate. Electronically Signed   By: Virgina Norfolk M.D.   On: 08/26/2019 17:05   US ABDOMEN LIMITED RUQ  Result Date: 08/26/2019 CLINICAL DATA:  Elevated LFTs EXAM: ULTRASOUND ABDOMEN LIMITED RIGHT UPPER QUADRANT COMPARISON:  None FINDINGS: Gallbladder: No gallstones or wall thickening visualized. No sonographic Murphy sign noted by sonographer. Common bile duct: Diameter: 2.8 mm, nondilated Liver: Diffusely increased hepatic echogenicity with loss of definition of the portal triads and diminished posterior through transmission compatible with hepatic steatosis. Geographic region of hypoattenuation along the gallbladder fossa is compatible with focal fatty sparing. Anechoic 1.6 x 1.3 x 1 cm cyst  in the left lobe liver. Additional 1 x 1.1 x 1 cm cyst in the right lobe liver. No concerning focal lesion. Portal vein is patent on color Doppler imaging with normal direction of blood flow towards the liver. Other: None. IMPRESSION: Diffusely increased hepatic echogenicity most compatible with hepatic steatosis with focal fatty sparing along the gallbladder fossa. Small simple appearing hepatic cysts. No other acute or concerning right upper quadrant abnormality. Electronically Signed   By: Lovena Le M.D.   On: 08/26/2019 18:40        Scheduled Meds: . amLODipine  10 mg Oral Daily  . benazepril  20 mg Oral Daily  . enoxaparin (LOVENOX) injection  40 mg Subcutaneous Q24H   Continuous Infusions: . sodium chloride 100 mL/hr at 08/27/19 1246  . azithromycin  Stopped (08/26/19 1935)  . cefTRIAXone (ROCEPHIN)  IV Stopped (08/26/19 1809)    Assessment & Plan:   Principal Problem:   Community acquired pneumonia Active Problems:   Essential hypertension, benign   Elevated CK   Generalized weakness   Community-acquired pneumonia: Presented with leukocytosis, O2 saturation 90% on room air per EDP, tachypnea, and bibasilar infiltrates on chest x-ray.  Likely predisposed from prolonged downtime. -Continue IV ceftriaxone and azithromycin -Supplemental oxygen as needed -Incentive spirometer  Elevated CK: Mildly elevated in setting of prolonged downtime. Continue ivf Ck am labs  Hypertension: Currently stable.  Resume home amlodipine-benazepril tomorrow.  Elevated LFTs: Mildly elevated LFTs.  RUQ ultrasound shows changes suggestive of hepatic steatosis and small simple hepatic cyst, otherwise no acute issue.   Improving.   Generalized weakness: With prolonged downtime at home without reported fall or loss of consciousness. -PT/OT eval- SNF rec. By PT  Hypokalemia- K is 2.9 Will replace, monitor am labs   DVT prophylaxis: Lovenox Code Status: Full Family Communication: None at bedside Disposition Plan: Back to previous home life Barrier: Patient is weak, unable to take p.o. intake, needs IV antibiotics       LOS: 0 days   Time spent: 45 minutes with more than 50% COC    Nolberto Hanlon, MD [facility] Pager [phone]  If 7PM-7AM, please contact night-coverage [email or web address] Password [facility] 08/27/2019, 2:56 PM

## 2019-08-27 NOTE — Evaluation (Signed)
Physical Therapy Evaluation Patient Details Name: Haley Mayo MRN: [id number] DOB: 15-Oct-1950 Today's Date: 08/27/2019   History of Present Illness  Pt is 69 yo female that presented to ED for generalized weakness, pt stated she has been down on the floor at home since 5/7. Reported recent [place] vaccination (5/3). PMH of HLD, HTN.    Clinical Impression  Pt alert, oriented, behavior WFLs though flat affect noted, and pt with racing thoughts, many questions asked and PT answered as able. Initially did not complain of pain but with mobility reported R lateral thigh pain. Pt stated at baseline she lives alone, independent, uses public transportation for errands/appointments, denies any falls in the last 6 months.  The patient demonstrated supine to sit with maxA, and upon returning to bed modAx2 for eccentric control of trunk and LE assistance. Sit <> stand performed several times from varying surfaces, from standard commode minAx2 to come fully to standing. Pt very challenged by elevated gurney (at lowest setting, pt short) to return to sitting, minAx2 to reposition safely. Pt spent several minutes seated on standard commode with bilateral UE and LE support. Unable to maintain seated balance unsupported. Pt ambulated in room ~79ft with RW and CGA, exhibited decreased step height/length, cueing for RW placement and use.  Overall the patient demonstrated deficits (see "PT Problem List") that impede the patient's functional abilities, safety, and mobility and would benefit from skilled PT intervention. Recommendation is SNF due to acute decline in functional status, current level of assistance needed, decreased caregiver support and inaccessible home environment.        Follow Up Recommendations SNF    Equipment Recommendations  Rolling walker with 5" wheels    Recommendations for Other Services OT consult     Precautions / Restrictions Precautions Precautions:  Fall Restrictions Weight Bearing Restrictions: No      Mobility  Bed Mobility Overal bed mobility: Needs Assistance Bed Mobility: Supine to Sit;Sit to Supine     Supine to sit: Max assist;HOB elevated Sit to supine: +2 for physical assistance;Mod assist   General bed mobility comments: pt challenged by weight shift and eccentric lowering to return to supine. very effortful for pt to come into a seated position.  Transfers Overall transfer level: Needs assistance Equipment used: Rolling walker (2 wheeled) Transfers: Sit to/from Stand Sit to Stand: Min guard;Min assist;+2 safety/equipment         General transfer comment: performed from elevated gurney pt able to stand with CGA. to safely reposition on EOB, minAx2, and from standard low commode, minAx2.  Ambulation/Gait   Gait Distance (Feet): 18 Feet Assistive device: Rolling walker (2 wheeled)       General Gait Details: pt with slow, cautious gait, decreased step height/length bilaterally, reliant on RW for  balance  Stairs            Wheelchair Mobility    Modified Rankin (Stroke Patients Only)       Balance Overall balance assessment: Needs assistance Sitting-balance support: Feet unsupported;Bilateral upper extremity supported Sitting balance-Leahy Scale: Poor       Standing balance-Leahy Scale: Fair Standing balance comment: was able to perform static standing briefly without RW support, but safety much improved with RW throughout mobility                             Pertinent Vitals/Pain Pain Assessment: Faces Faces Pain Scale: Hurts a little bit Pain Location: R lateral thigh Pain  Descriptors / Indicators: Grimacing;Aching Pain Intervention(s): Limited activity within patient's tolerance;Monitored during session;Repositioned    Home Living Family/patient expects to be discharged to:: Private residence Living Arrangements: Alone Available Help at Discharge: Other (Comment)(pt  reported she has no help nearby. Son lives in [place]) Type of Home: House Home Access: Stairs to enter Entrance Stairs-Rails: Right;Left;Can reach both Technical brewer of Steps: 4 or 3 Home Layout: One level Home Equipment: Grab bars - tub/shower      Prior Function Level of Independence: Independent         Comments: pt does not drive, uses transportation for groceries/doctors appointments     Hand Dominance   Dominant Hand: Right    Extremity/Trunk Assessment   Upper Extremity Assessment Upper Extremity Assessment: Generalized weakness(grossly 4-/5)    Lower Extremity Assessment Lower Extremity Assessment: Generalized weakness;RLE deficits/detail;LLE deficits/detail RLE Deficits / Details: 3+/5 hip flexion, grossly 4-/5 remainder of testing RLE Sensation: WNL LLE Deficits / Details: 3+/5 hip flexion, grossly 4-/5 remainder of testing LLE Sensation: WNL       Communication   Communication: No difficulties  Cognition Arousal/Alertness: Awake/alert Behavior During Therapy: WFL for tasks assessed/performed Overall Cognitive Status: Within Functional Limits for tasks assessed                                 General Comments: Pt alert, oriented, occasional redirection to task, pt seemed to have racing thoughts, many questions and answered as able by PT      General Comments      Exercises Other Exercises Other Exercises: Pt educated on call bell, purewick and it's use, mobility, use of RW, expectations during hospital stay, PT role. Other Exercises: Pt able to utilize standard commode, minAx2 for safe transfers, supervision for pericare. spent several minutes in a seated position, arms supported by RW.   Assessment/Plan    PT Assessment Patient needs continued PT services  PT Problem List Decreased strength;Decreased mobility;Decreased activity tolerance;Decreased balance;Decreased knowledge of use of DME       PT Treatment Interventions DME  instruction;Therapeutic exercise;Gait training;Stair training;Neuromuscular re-education;Balance training;Functional mobility training;Therapeutic activities;Patient/family education    PT Goals (Current goals can be found in the Care Plan section)  Acute Rehab PT Goals Patient Stated Goal: to feel better PT Goal Formulation: With patient Time For Goal Achievement: 09/10/19 Potential to Achieve Goals: Good    Frequency Min 2X/week   Barriers to discharge Inaccessible home environment;Decreased caregiver support      Co-evaluation PT/OT/SLP Co-Evaluation/Treatment: Yes Reason for Co-Treatment: For patient/therapist safety;To address functional/ADL transfers PT goals addressed during session: Mobility/safety with mobility;Balance;Proper use of DME OT goals addressed during session: ADL's and self-care;Proper use of Adaptive equipment and DME       AM-PAC PT "6 Clicks" Mobility  Outcome Measure Help needed turning from your back to your side while in a flat bed without using bedrails?: A Lot Help needed moving from lying on your back to sitting on the side of a flat bed without using bedrails?: A Lot Help needed moving to and from a bed to a chair (including a wheelchair)?: A Lot Help needed standing up from a chair using your arms (e.g., wheelchair or bedside chair)?: A Lot Help needed to walk in hospital room?: A Little Help needed climbing 3-5 steps with a railing? : Total 6 Click Score: 12    End of Session Equipment Utilized During Treatment: Gait belt Activity Tolerance: Patient  limited by fatigue Patient left: in bed;with call bell/phone within reach;with nursing/sitter in room Nurse Communication: Mobility status PT Visit Diagnosis: Other abnormalities of gait and mobility (R26.89);Muscle weakness (generalized) (M62.81);Difficulty in walking, not elsewhere classified (R26.2)    Time: [id number] PT Time Calculation (min) (ACUTE ONLY): 53 min   Charges:   PT  Evaluation $PT Eval Low Complexity: 1 Low PT Treatments $Therapeutic Exercise: 23-37 mins       Lieutenant Diego PT, DPT 10:07 AM,08/27/19

## 2019-08-27 NOTE — ED Notes (Signed)
Report given to Shea Stakes, [place]

## 2019-08-27 NOTE — ED Notes (Signed)
Pt given 2 warm blankets

## 2019-08-28 ENCOUNTER — Inpatient Hospital Stay: Payer: Medicare Other

## 2019-08-28 LAB — COMPREHENSIVE METABOLIC PANEL
ALT: 45 U/L — ABNORMAL HIGH (ref 0–44)
AST: 30 U/L (ref 15–41)
Albumin: 3.1 g/dL — ABNORMAL LOW (ref 3.5–5.0)
Alkaline Phosphatase: 63 U/L (ref 38–126)
Anion gap: 9 (ref 5–15)
BUN: 9 mg/dL (ref 8–23)
CO2: 24 mmol/L (ref 22–32)
Calcium: 8.2 mg/dL — ABNORMAL LOW (ref 8.9–10.3)
Chloride: 109 mmol/L (ref 98–111)
Creatinine, Ser: 0.49 mg/dL (ref 0.44–1.00)
GFR calc Af Amer: >60 mL/min (ref >60)
GFR calc non Af Amer: >60 mL/min (ref >60)
Glucose, Bld: 132 mg/dL — ABNORMAL HIGH (ref 70–99)
Potassium: 2.6 mmol/L — CL (ref 3.5–5.1)
Sodium: 142 mmol/L (ref 135–145)
Total Bilirubin: 0.9 mg/dL (ref 0.3–1.2)
Total Protein: 6.8 g/dL (ref 6.5–8.1)

## 2019-08-28 LAB — URINALYSIS, COMPLETE (UACMP) WITH MICROSCOPIC
Bacteria, UA: NONE SEEN
Bilirubin Urine: NEGATIVE
Glucose, UA: NEGATIVE mg/dL
Hgb urine dipstick: NEGATIVE
Ketones, ur: NEGATIVE mg/dL
Leukocytes,Ua: NEGATIVE
Nitrite: NEGATIVE
Protein, ur: NEGATIVE mg/dL
Specific Gravity, Urine: 1.016 (ref 1.005–1.030)
pH: 6 (ref 5.0–8.0)

## 2019-08-28 LAB — CBC
HCT: 40.5 % (ref 36.0–46.0)
Hemoglobin: 13.5 g/dL (ref 12.0–15.0)
MCH: 27.3 pg (ref 26.0–34.0)
MCHC: 33.3 g/dL (ref 30.0–36.0)
MCV: 82 fL (ref 80.0–100.0)
Platelets: 262 10*3/uL (ref 150–400)
RBC: 4.94 MIL/uL (ref 3.87–5.11)
RDW: 14.9 % (ref 11.5–15.5)
WBC: 19.1 10*3/uL — ABNORMAL HIGH (ref 4.0–10.5)
nRBC: 0 % (ref 0.0–0.2)

## 2019-08-28 LAB — CK: Total CK: 711 U/L — ABNORMAL HIGH (ref 38–234)

## 2019-08-28 MED ORDER — POTASSIUM CHLORIDE 10 MEQ/100ML IV SOLN
10.0000 meq | INTRAVENOUS | Status: AC
  Administered 2019-08-28 (×6): 10 meq via INTRAVENOUS
  Filled 2019-08-28 (×2): qty 100

## 2019-08-28 MED ORDER — POTASSIUM CHLORIDE CRYS ER 20 MEQ PO TBCR
40.0000 meq | EXTENDED_RELEASE_TABLET | Freq: Once | ORAL | Status: AC
  Administered 2019-08-28: 40 meq via ORAL
  Filled 2019-08-28: qty 2

## 2019-08-28 NOTE — Progress Notes (Signed)
Pt had critical K= 2.6. Reported critical to hospitalist. No new orders received. Will continue to monitor.

## 2019-08-28 NOTE — TOC Progression Note (Signed)
Transition of Care [facility]) - Progression Note    Patient Details  Name: Haley Mayo MRN: [id number] Date of Birth: [birth year]  Transition of Care [facility]) CM/SW [place], RN Phone Number: 08/28/2019, 10:09 AM  Clinical Narrative:    Spoke with the patient about DC plan ane needs, she lives at home and has been very weak \, she agrees to a bedsearch to go to SNF for STR, PASSR done and FL2 completed Bedsearch sent will review bed offers once obtained        Expected Discharge Plan and Services                                                 Social Determinants of Health (SDOH) Interventions    Readmission Risk Interventions No flowsheet data found.

## 2019-08-28 NOTE — NC FL2 (Signed)
[place] LEVEL OF CARE SCREENING TOOL     IDENTIFICATION  Patient Name: Haley Mayo Birthdate: [birth year] Sex: female Admission Date (Current Location): 08/26/2019  [place] and [place] Number:  Engineering geologist and Address:  [facility], [address] [place], [place] [address]      Provider Number: [id number]  Attending Physician Name and Address:  Nolberto Hanlon, MD  Relative Name and Phone Number:  Gershon Mussel, son [phone]    Current Level of Care: Hospital Recommended Level of Care: [place] Prior Approval Number:    Date Approved/Denied:   PASRR Number: [id number]  Discharge Plan: SNF    Current Diagnoses: Patient Active Problem List   Diagnosis Date Noted  . CAP (community acquired pneumonia) 08/27/2019  . Community acquired pneumonia 08/26/2019  . Elevated CK 08/26/2019  . Generalized weakness 08/26/2019  . Healthcare maintenance 02/02/2011  . Smoking 02/02/2011  . 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 Hyperlipemia 01/27/2010  . LEUKOCYTOSIS 01/27/2010  . Essential hypertension, benign 10/04/2009    Orientation RESPIRATION BLADDER Height & Weight     Self, Time, Situation, Place  Normal Continent Weight: 73.5 kg Height:  5' (152.4 cm)  BEHAVIORAL SYMPTOMS/MOOD NEUROLOGICAL BOWEL NUTRITION STATUS      Continent Diet(Heart Healthy)  AMBULATORY STATUS COMMUNICATION OF NEEDS Skin   Extensive Assist Verbally Normal                       Personal Care Assistance Level of Assistance              Functional Limitations Info             SPECIAL CARE FACTORS FREQUENCY  PT (By licensed PT)     PT Frequency: 5 times per week              Contractures Contractures Info: Not present    Additional Factors Info  Code Status, Allergies Code Status Info: full code Allergies Info: no known drug allergies           Current Medications (08/28/2019):  This is the current hospital  active medication list Current Facility-Administered Medications  Medication Dose Route Frequency Provider Last Rate Last Admin  . 0.9 %  sodium chloride infusion   Intravenous Continuous Nolberto Hanlon, MD 100 mL/hr at 08/28/19 1141 New Bag at 08/28/19 1141  . acetaminophen (TYLENOL) tablet 650 mg  650 mg Oral Q6H PRN Lang Snow, NP   650 mg at 08/28/19 0840  . amLODipine (NORVASC) tablet 10 mg  10 mg Oral Daily Lenore Cordia, MD   10 mg at 08/28/19 0841  . azithromycin (ZITHROMAX) 500 mg in sodium chloride 0.9 % 250 mL IVPB  500 mg Intravenous Q24H Merlyn Lot, MD 250 mL/hr at 08/27/19 1800 500 mg at 08/27/19 1800  . benazepril (LOTENSIN) tablet 20 mg  20 mg Oral Daily Lenore Cordia, MD   20 mg at 08/28/19 0841  . cefTRIAXone (ROCEPHIN) 2 g in sodium chloride 0.9 % 100 mL IVPB  2 g Intravenous Q24H Merlyn Lot, MD 200 mL/hr at 08/27/19 1753 2 g at 08/27/19 1753  . enoxaparin (LOVENOX) injection 40 mg  40 mg Subcutaneous Q24H Zada Finders R, MD   40 mg at 08/26/19 2055  . potassium chloride 10 mEq in 100 mL IVPB  10 mEq Intravenous Q1 Hr x 6 Nolberto Hanlon, MD 50 mL/hr at 08/28/19 1014 10 mEq at 08/28/19 1014  Discharge Medications: Please see discharge summary for a list of discharge medications.  Relevant Imaging Results:  Relevant Lab Results:   Additional Information SS# [id number]  Su Hilt, RN

## 2019-08-28 NOTE — Progress Notes (Signed)
PROGRESS NOTE    Haley Mayo  [id number] DOB: 20-Jul-1950 DOA: 08/26/2019 PCP: Frazier Richards, MD    Brief Narrative:  Haley Mayo is a 69 y.o. female with medical history significant for hypertension who presents to the ED for evaluation of generalized weakness.  Patient states she received the first dose of [place] COVID-19 vaccination on 08/18/2019.  She says 3 days later she was working outside for about 20 minutes when she began to feel unwell.  She went inside and had nausea with emesis.  She is felt extremely weak/lethargic and tried taking a shower without improvement.  She says she has been lying on the floor since that time due to her weakness.  She says she has not fallen but was scared of falling when asked why she has been lying on the floor rather than getting into bed.  She did not lose consciousness.  She denies any chest pain or dyspnea.  She has had decreased oral intake.  She denies any abdominal pain, dysuria, or diarrhea.  Per ED triage documentation police were called by her son for well check and she was found on the floor.  She is noted to be hypertensive with BP 192/67 and CBG 167.    Consultants:     Procedures:   Antimicrobials:   Ceftriaxone and azithromycin   Subjective: Days feeling better today. Febrile with T-max of 101.1  Objective: Vitals:   08/27/19 1300 08/27/19 1417 08/27/19 2352 08/28/19 0745  BP: (!) 173/83 (!) 157/71 (!) 153/73 (!) 174/80  Pulse: 72 76 85 82  Resp: (!) 23 16 17    Temp:  99.2 F (37.3 C) 98.9 F (37.2 C) (!) 101.1 F (38.4 C)  TempSrc:  Oral  Oral  SpO2: 97% 92% 91% 91%  Weight:      Height:        Intake/Output Summary (Last 24 hours) at 08/28/2019 1437 Last data filed at 08/28/2019 [id number] Gross per 24 hour  Intake 2451.07 ml  Output --  Net 2451.07 ml   Filed Weights   08/26/19 1625  Weight: 73.5 kg    Examination:  General exam: Appears calm and comfortable,  Respiratory system: Clear to  auscultation. Respiratory effort normal. No wheezing or rhonchi Cardiovascular system: S1 & S2 heard, RRR. No JVD, murmurs, rubs, gallops or clicks.  Gastrointestinal system: Abdomen is nondistended, soft and nontender.. Normal bowel sounds heard. Central nervous system: Alert and oriented.  Grossly intact Extremities: No edema or cyanosis Skin: Warm and dry Psychiatry: Judgement and insight appear normal. Mood & affect appropriate.     Data Reviewed: I have personally reviewed following labs and imaging studies  CBC: Recent Labs  Lab 08/26/19 1638 08/27/19 0453 08/28/19 0510  WBC 27.1* 22.9* 19.1*  NEUTROABS 23.6*  --   --   HGB 15.4* 13.0 13.5  HCT 47.1* 40.1 40.5  MCV 84.3 85.1 82.0  PLT 304 270 [id number]   Basic Metabolic Panel: Recent Labs  Lab 08/26/19 1638 08/27/19 0453 08/28/19 0510  NA 145 144 142  K 4.5 2.9* 2.6*  CL 110 112* 109  CO2 23 24 24   GLUCOSE 166* 163* 132*  BUN 28* 17 9  CREATININE 0.81 0.58 0.49  CALCIUM 9.4 8.6* 8.2*   GFR: Estimated Creatinine Clearance: 61.1 mL/min (by C-G formula based on SCr of 0.49 mg/dL). Liver Function Tests: Recent Labs  Lab 08/26/19 1638 08/27/19 0453 08/28/19 0510  AST 99* 47* 30  ALT 71* 53* 45*  ALKPHOS  77 69 63  BILITOT 1.3* 0.7 0.9  PROT 8.4* 7.0 6.8  ALBUMIN 4.0 3.3* 3.1*   No results for input(s): LIPASE, AMYLASE in the last 168 hours. No results for input(s): AMMONIA in the last 168 hours. Coagulation Profile: No results for input(s): INR, PROTIME in the last 168 hours. Cardiac Enzymes: Recent Labs  Lab 08/26/19 1638 08/27/19 0453 08/28/19 0510  CKTOTAL 3,162* 1,685* 711*   BNP (last 3 results) No results for input(s): PROBNP in the last 8760 hours. HbA1C: No results for input(s): HGBA1C in the last 72 hours. CBG: No results for input(s): GLUCAP in the last 168 hours. Lipid Profile: No results for input(s): CHOL, HDL, LDLCALC, TRIG, CHOLHDL, LDLDIRECT in the last 72 hours. Thyroid Function  Tests: No results for input(s): TSH, T4TOTAL, FREET4, T3FREE, THYROIDAB in the last 72 hours. Anemia Panel: No results for input(s): VITAMINB12, FOLATE, FERRITIN, TIBC, IRON, RETICCTPCT in the last 72 hours. Sepsis Labs: Recent Labs  Lab 08/26/19 1638  PROCALCITON 0.12  LATICACIDVEN 1.2    Recent Results (from the past 240 hour(s))  SARS Coronavirus 2 by RT PCR (hospital order, performed in [facility] hospital lab) Nasopharyngeal Nasopharyngeal Swab     Status: None   Collection Time: 08/26/19  5:23 PM   Specimen: Nasopharyngeal Swab  Result Value Ref Range Status   SARS Coronavirus 2 NEGATIVE NEGATIVE Final    Comment: (NOTE) SARS-CoV-2 target nucleic acids are NOT DETECTED. The SARS-CoV-2 RNA is generally detectable in upper and lower respiratory specimens during the acute phase of infection. The lowest concentration of SARS-CoV-2 viral copies this assay can detect is 250 copies / mL. A negative result does not preclude SARS-CoV-2 infection and should not be used as the sole basis for treatment or other patient management decisions.  A negative result may occur with improper specimen collection / handling, submission of specimen other than nasopharyngeal swab, presence of viral mutation(s) within the areas targeted by this assay, and inadequate number of viral copies (<250 copies / mL). A negative result must be combined with clinical observations, patient history, and epidemiological information. Fact Sheet for Patients:   StrictlyIdeas.no Fact Sheet for Healthcare Providers: BankingDealers.co.za This test is not yet approved or cleared  by the [place] FDA and has been authorized for detection and/or diagnosis of SARS-CoV-2 by FDA under an Emergency Use Authorization (EUA).  This EUA will remain in effect (meaning this test can be used) for the duration of the COVID-19 declaration under Section 564(b)(1) of the Act, 21  U.S.C. section 360bbb-3(b)(1), unless the authorization is terminated or revoked sooner. Performed at [facility], [address] [place], [place] [address]          Radiology Studies: DG Chest [place] 1 View  Result Date: 08/28/2019 CLINICAL DATA:  Fever EXAM: PORTABLE CHEST 1 VIEW COMPARISON:  Two days ago FINDINGS: Mild linear opacity in the bilateral lung consistent with atelectasis or scarring. Cardiomegaly. No edema, effusion, or pneumothorax. IMPRESSION: Cardiomegaly and mild scarring or atelectasis. No focal consolidation or change from 2 days ago. Electronically Signed   By: Monte Fantasia M.D.   On: 08/28/2019 10:16   DG Chest Portable 1 View  Result Date: 08/26/2019 CLINICAL DATA:  Weakness. EXAM: PORTABLE CHEST 1 VIEW COMPARISON:  None. FINDINGS: Mild atelectasis and/or infiltrate is seen within the bilateral lung bases. There is no evidence of a pleural effusion or pneumothorax. The cardiac silhouette is mildly enlarged. The visualized skeletal structures are unremarkable. IMPRESSION: Mild bibasilar atelectasis  and/or infiltrate. Electronically Signed   By: Virgina Norfolk M.D.   On: 08/26/2019 17:05   US ABDOMEN LIMITED RUQ  Result Date: 08/26/2019 CLINICAL DATA:  Elevated LFTs EXAM: ULTRASOUND ABDOMEN LIMITED RIGHT UPPER QUADRANT COMPARISON:  None FINDINGS: Gallbladder: No gallstones or wall thickening visualized. No sonographic Murphy sign noted by sonographer. Common bile duct: Diameter: 2.8 mm, nondilated Liver: Diffusely increased hepatic echogenicity with loss of definition of the portal triads and diminished posterior through transmission compatible with hepatic steatosis. Geographic region of hypoattenuation along the gallbladder fossa is compatible with focal fatty sparing. Anechoic 1.6 x 1.3 x 1 cm cyst in the left lobe liver. Additional 1 x 1.1 x 1 cm cyst in the right lobe liver. No concerning focal lesion. Portal vein is patent on color Doppler imaging  with normal direction of blood flow towards the liver. Other: None. IMPRESSION: Diffusely increased hepatic echogenicity most compatible with hepatic steatosis with focal fatty sparing along the gallbladder fossa. Small simple appearing hepatic cysts. No other acute or concerning right upper quadrant abnormality. Electronically Signed   By: Lovena Le M.D.   On: 08/26/2019 18:40        Scheduled Meds: . amLODipine  10 mg Oral Daily  . benazepril  20 mg Oral Daily  . enoxaparin (LOVENOX) injection  40 mg Subcutaneous Q24H   Continuous Infusions: . sodium chloride 100 mL/hr at 08/28/19 1141  . azithromycin 500 mg (08/27/19 1800)  . cefTRIAXone (ROCEPHIN)  IV 2 g (08/27/19 1753)    Assessment & Plan:   Principal Problem:   Community acquired pneumonia Active Problems:   Essential hypertension, benign   Elevated CK   Generalized weakness   CAP (community acquired pneumonia)   Community-acquired pneumonia: Presented with leukocytosis, O2 saturation 90% on room air per EDP, tachypnea, and bibasilar infiltrates on chest x-ray.  Likely predisposed from prolonged downtime. -Continue IV ceftriaxone and azithromycin -Supplemental oxygen as needed -Incentive spirometer Fever today will repeat cultures. Repeat cxr no change   Elevated CK: Mildly elevated in setting of prolonged downtime. Ck improving  Continue ivf, decrease rate to 67ml/hr Ck am labs  Hypertension: Currently stable.  Resume home amlodipine-benazepril tomorrow.  Elevated LFTs: Mildly elevated LFTs.  RUQ ultrasound shows changes suggestive of hepatic steatosis and small simple hepatic cyst, otherwise no acute issue.   Improving.   Generalized weakness: With prolonged downtime at home without reported fall or loss of consciousness. -PT/OT eval- SNF rec. By PT  Hypokalemia- K is 2.6 today, will replace Ck am labs   DVT prophylaxis: Lovenox Code Status: Full Family Communication: None at  bedside Disposition Plan: Back to previous home life Barrier: Patient is weak, unable to take p.o. intake, needs IV antibiotics, needs snf, discussed with case mx       LOS: 1 day   Time spent: 45 minutes with more than 50% COC    Nolberto Hanlon, MD [facility] Pager [phone]  If 7PM-7AM, please contact night-coverage [email or web address] Password [facility] 08/28/2019, 2:37 PM Patient ID: Haley Mayo, female   DOB: 1950-11-03, 69 y.o.   MRN: [id number]

## 2019-08-28 NOTE — Progress Notes (Signed)
PT Cancellation Note  Patient Details Name: Haley Mayo MRN: [id number] DOB: 05-07-1950   Cancelled Treatment:    Reason Eval/Treat Not Completed: Other (comment). Consult received and chart reviewed. Patient noted with critically low K+ (2.6) contraindicated for exertional activity at this time.  Will continue efforts next date pending medical stability and appropriateness.   Lieutenant Diego PT, DPT 1:08 PM,08/28/19

## 2019-08-28 NOTE — TOC Progression Note (Signed)
Transition of Care [facility]) - Progression Note    Patient Details  Name: Haley Mayo MRN: [id number] Date of Birth: 09-20-1950  Transition of Care [facility]) CM/SW Contact  Su Hilt, RN Phone Number: 08/28/2019, 1:54 PM  Clinical Narrative:     Met with the patient to discuss Bed offers, she chose to go to [facility], I notified Claiborne Billings at [facility],        Expected Discharge Plan and Services                                                 Social Determinants of Health (SDOH) Interventions    Readmission Risk Interventions No flowsheet data found.

## 2019-08-29 ENCOUNTER — Inpatient Hospital Stay
Admit: 2019-08-29 | Discharge: 2019-08-29 | Disposition: A | Discharge status: Home / Self Care | Payer: Medicare Other | Attending: Internal Medicine | Admitting: Internal Medicine

## 2019-08-29 LAB — CBC
HCT: 39.6 % (ref 36.0–46.0)
Hemoglobin: 13.8 g/dL (ref 12.0–15.0)
MCH: 28.2 pg (ref 26.0–34.0)
MCHC: 34.8 g/dL (ref 30.0–36.0)
MCV: 80.8 fL (ref 80.0–100.0)
Platelets: 251 10*3/uL (ref 150–400)
RBC: 4.9 MIL/uL (ref 3.87–5.11)
RDW: 14.5 % (ref 11.5–15.5)
WBC: 18.2 10*3/uL — ABNORMAL HIGH (ref 4.0–10.5)
nRBC: 0 % (ref 0.0–0.2)

## 2019-08-29 LAB — ECHOCARDIOGRAM COMPLETE
Height: 60 in
Weight: 2592 oz

## 2019-08-29 LAB — BASIC METABOLIC PANEL
Anion gap: 9 (ref 5–15)
BUN: 9 mg/dL (ref 8–23)
CO2: 24 mmol/L (ref 22–32)
Calcium: 8.3 mg/dL — ABNORMAL LOW (ref 8.9–10.3)
Chloride: 106 mmol/L (ref 98–111)
Creatinine, Ser: 0.49 mg/dL (ref 0.44–1.00)
GFR calc Af Amer: >60 mL/min (ref >60)
GFR calc non Af Amer: >60 mL/min (ref >60)
Glucose, Bld: 151 mg/dL — ABNORMAL HIGH (ref 70–99)
Potassium: 2.8 mmol/L — ABNORMAL LOW (ref 3.5–5.1)
Sodium: 139 mmol/L (ref 135–145)

## 2019-08-29 LAB — URINE CULTURE: Culture: NO GROWTH

## 2019-08-29 LAB — CK: Total CK: 366 U/L — ABNORMAL HIGH (ref 38–234)

## 2019-08-29 LAB — POTASSIUM: Potassium: 4.3 mmol/L (ref 3.5–5.1)

## 2019-08-29 MED ORDER — POTASSIUM CHLORIDE CRYS ER 20 MEQ PO TBCR
40.0000 meq | EXTENDED_RELEASE_TABLET | Freq: Once | ORAL | Status: AC
  Administered 2019-08-29: 40 meq via ORAL
  Filled 2019-08-29: qty 2

## 2019-08-29 MED ORDER — POTASSIUM CHLORIDE 10 MEQ/100ML IV SOLN
10.0000 meq | INTRAVENOUS | Status: DC
  Filled 2019-08-29 (×3): qty 100
  Filled 2019-08-29: qty 600
  Filled 2019-08-29 (×2): qty 100

## 2019-08-29 MED ORDER — POTASSIUM CHLORIDE 10 MEQ/100ML IV SOLN
10.0000 meq | INTRAVENOUS | Status: AC
  Administered 2019-08-29 (×6): 10 meq via INTRAVENOUS
  Filled 2019-08-29: qty 100

## 2019-08-29 NOTE — Progress Notes (Signed)
*  PRELIMINARY RESULTS* Echocardiogram 2D Echocardiogram has been performed.  Haley Mayo Hadiyah Maricle 08/29/2019, 1:43 PM

## 2019-08-29 NOTE — Progress Notes (Signed)
PT Cancellation Note  Patient Details Name: Haley Mayo MRN: [id number] DOB: 03/18/1951   Cancelled Treatment:    Reason Eval/Treat Not Completed: Other (comment) Consult received and chart reviewed. Patient noted with  low K+ (2.8) contraindicated for exertional activity at this time. Will continue  next date pending medical stability allows.   Alanson Puls, [place] DPT 08/29/2019, 2:31 PM

## 2019-08-29 NOTE — Progress Notes (Signed)
PROGRESS NOTE    Haley Mayo  [id number] DOB: [birth year] DOA: 08/26/2019 PCP: Frazier Richards, MD    Brief Narrative:  Haley Mayo is a 69 y.o. female with medical history significant for hypertension who presents to the ED for evaluation of generalized weakness.  Patient states she received the first dose of [place] COVID-19 vaccination on 08/18/2019.  She says 3 days later she was working outside for about 20 minutes when she began to feel unwell.  She went inside and had nausea with emesis.  She is felt extremely weak/lethargic and tried taking a shower without improvement.  She says she has been lying on the floor since that time due to her weakness.  She says she has not fallen but was scared of falling when asked why she has been lying on the floor rather than getting into bed.  She did not lose consciousness.  She denies any chest pain or dyspnea.  She has had decreased oral intake.  She denies any abdominal pain, dysuria, or diarrhea.  Per ED triage documentation police were called by her son for well check and she was found on the floor.  She is noted to be hypertensive with BP 192/67 and CBG 167.    Consultants:     Procedures:   Antimicrobials:   Ceftriaxone and azithromycin   Subjective: Appears tired, tells me in low voice, she is feeling better. Denies vomiting or diarrhea, no abdominal pain.febrile  Objective: Vitals:   08/29/19 0746 08/29/19 0750 08/29/19 0840 08/29/19 1059  BP: (!) 163/84     Pulse: 85 82    Resp: 18     Temp: (!) 100.7 F (38.2 C)  (!) 100.5 F (38.1 C) (!) 101 F (38.3 C)  TempSrc:   Axillary Axillary  SpO2: 90% 93%    Weight:      Height:        Intake/Output Summary (Last 24 hours) at 08/29/2019 1139 Last data filed at 08/29/2019 1026 Gross per 24 hour  Intake 2227.75 ml  Output 1650 ml  Net 577.75 ml   Filed Weights   08/26/19 1625  Weight: 73.5 kg    Examination:  General exam: Appears calm and comfortable,  appears fatigue and weak Respiratory system: Clear to auscultation. Respiratory effort normal. No wheezing or rhonchi Cardiovascular system: S1 & S2 heard, RRR. No JVD, murmurs, rubs, gallops or clicks.  Gastrointestinal system: Abdomen is nondistended, soft and nontender.. Normal bowel sounds heard. Central nervous system: Alert and oriented.   Extremities: No edema  Skin: Warm and dry Psychiatry: Judgement and insight appear normal. Mood & affect appropriate.     Data Reviewed: I have personally reviewed following labs and imaging studies  CBC: Recent Labs  Lab 08/26/19 1638 08/27/19 0453 08/28/19 0510 08/29/19 0439  WBC 27.1* 22.9* 19.1* 18.2*  NEUTROABS 23.6*  --   --   --   HGB 15.4* 13.0 13.5 13.8  HCT 47.1* 40.1 40.5 39.6  MCV 84.3 85.1 82.0 80.8  PLT 304 270 262 [id number]   Basic Metabolic Panel: Recent Labs  Lab 08/26/19 1638 08/27/19 0453 08/28/19 0510 08/29/19 0439  NA 145 144 142 139  K 4.5 2.9* 2.6* 2.8*  CL 110 112* 109 106  CO2 23 24 24 24   GLUCOSE 166* 163* 132* 151*  BUN 28* 17 9 9   CREATININE 0.81 0.58 0.49 0.49  CALCIUM 9.4 8.6* 8.2* 8.3*   GFR: Estimated Creatinine Clearance: 61.1 mL/min (by C-G formula based on  SCr of 0.49 mg/dL). Liver Function Tests: Recent Labs  Lab 08/26/19 1638 08/27/19 0453 08/28/19 0510  AST 99* 47* 30  ALT 71* 53* 45*  ALKPHOS 77 69 63  BILITOT 1.3* 0.7 0.9  PROT 8.4* 7.0 6.8  ALBUMIN 4.0 3.3* 3.1*   No results for input(s): LIPASE, AMYLASE in the last 168 hours. No results for input(s): AMMONIA in the last 168 hours. Coagulation Profile: No results for input(s): INR, PROTIME in the last 168 hours. Cardiac Enzymes: Recent Labs  Lab 08/26/19 1638 08/27/19 0453 08/28/19 0510 08/29/19 0439  CKTOTAL 3,162* 1,685* 711* 366*   BNP (last 3 results) No results for input(s): PROBNP in the last 8760 hours. HbA1C: No results for input(s): HGBA1C in the last 72 hours. CBG: No results for input(s): GLUCAP in the  last 168 hours. Lipid Profile: No results for input(s): CHOL, HDL, LDLCALC, TRIG, CHOLHDL, LDLDIRECT in the last 72 hours. Thyroid Function Tests: No results for input(s): TSH, T4TOTAL, FREET4, T3FREE, THYROIDAB in the last 72 hours. Anemia Panel: No results for input(s): VITAMINB12, FOLATE, FERRITIN, TIBC, IRON, RETICCTPCT in the last 72 hours. Sepsis Labs: Recent Labs  Lab 08/26/19 1638  PROCALCITON 0.12  LATICACIDVEN 1.2    Recent Results (from the past 240 hour(s))  SARS Coronavirus 2 by RT PCR (hospital order, performed in [facility] hospital lab) Nasopharyngeal Nasopharyngeal Swab     Status: None   Collection Time: 08/26/19  5:23 PM   Specimen: Nasopharyngeal Swab  Result Value Ref Range Status   SARS Coronavirus 2 NEGATIVE NEGATIVE Final    Comment: (NOTE) SARS-CoV-2 target nucleic acids are NOT DETECTED. The SARS-CoV-2 RNA is generally detectable in upper and lower respiratory specimens during the acute phase of infection. The lowest concentration of SARS-CoV-2 viral copies this assay can detect is 250 copies / mL. A negative result does not preclude SARS-CoV-2 infection and should not be used as the sole basis for treatment or other patient management decisions.  A negative result may occur with improper specimen collection / handling, submission of specimen other than nasopharyngeal swab, presence of viral mutation(s) within the areas targeted by this assay, and inadequate number of viral copies (<250 copies / mL). A negative result must be combined with clinical observations, patient history, and epidemiological information. Fact Sheet for Patients:   StrictlyIdeas.no Fact Sheet for Healthcare Providers: BankingDealers.co.za This test is not yet approved or cleared  by the [place] FDA and has been authorized for detection and/or diagnosis of SARS-CoV-2 by FDA under an Emergency Use Authorization (EUA).  This EUA  will remain in effect (meaning this test can be used) for the duration of the COVID-19 declaration under Section 564(b)(1) of the Act, 21 U.S.C. section 360bbb-3(b)(1), unless the authorization is terminated or revoked sooner. Performed at [facility], [address] [place], [place] [address]   CULTURE, BLOOD (ROUTINE X 2) w Reflex to ID Panel     Status: None (Preliminary result)   Collection Time: 08/28/19  8:37 AM   Specimen: BLOOD  Result Value Ref Range Status   Specimen Description BLOOD BLOOD RIGHT HAND  Final   Special Requests   Final    BOTTLES DRAWN AEROBIC AND ANAEROBIC Blood Culture results may not be optimal due to an excessive volume of blood received in culture bottles   Culture   Final    NO GROWTH < 24 HOURS Performed at [facility], [address], [place], [place] [address]    Report Status  PENDING  Incomplete  CULTURE, BLOOD (ROUTINE X 2) w Reflex to ID Panel     Status: None (Preliminary result)   Collection Time: 08/28/19  8:45 AM   Specimen: BLOOD  Result Value Ref Range Status   Specimen Description BLOOD RIGHT ANTECUBITAL  Final   Special Requests   Final    BOTTLES DRAWN AEROBIC AND ANAEROBIC Blood Culture adequate volume   Culture   Final    NO GROWTH < 24 HOURS Performed at [facility], [address] [place], [place] [address]    Report Status PENDING  Incomplete  Urine Culture     Status: None   Collection Time: 08/28/19 10:11 AM   Specimen: Urine, Random  Result Value Ref Range Status   Specimen Description   Final    URINE, RANDOM Performed at [facility], [address] [place], [place] [address]    Special Requests   Final    NONE Performed at [facility], [address], [place], [place] [address]    Culture   Final    NO GROWTH Performed at [facility], [place] [address], [place], [place] [address]    Report Status 08/29/2019 FINAL  Final         Radiology  Studies: DG Chest [place] 1 View  Result Date: 08/28/2019 CLINICAL DATA:  Fever EXAM: PORTABLE CHEST 1 VIEW COMPARISON:  Two days ago FINDINGS: Mild linear opacity in the bilateral lung consistent with atelectasis or scarring. Cardiomegaly. No edema, effusion, or pneumothorax. IMPRESSION: Cardiomegaly and mild scarring or atelectasis. No focal consolidation or change from 2 days ago. Electronically Signed   By: Monte Fantasia M.D.   On: 08/28/2019 10:16        Scheduled Meds: . amLODipine  10 mg Oral Daily  . benazepril  20 mg Oral Daily  . enoxaparin (LOVENOX) injection  40 mg Subcutaneous Q24H   Continuous Infusions: . sodium chloride 75 mL/hr at 08/29/19 0300  . azithromycin Stopped (08/28/19 2038)  . cefTRIAXone (ROCEPHIN)  IV Stopped (08/28/19 1858)  . potassium chloride 10 mEq (08/29/19 1050)    Assessment & Plan:   Principal Problem:   Community acquired pneumonia Active Problems:   Essential hypertension, benign   Elevated CK   Generalized weakness   CAP (community acquired pneumonia)   Community-acquired pneumonia: Presented with leukocytosis, O2 saturation 90% on room air per EDP, tachypnea, and bibasilar infiltrates on chest x-ray.  Likely predisposed from prolonged downtime. -Continue IV ceftriaxone and azithromycin   Fever- Etiology is unclear.  Possibly viral related.  Spoke to ID on-call, Dr. De Burrs, recommended checking EBV and CMV IgM levels. Continue supportive care ucx and bcx so far neg.   Elevated CK: Mildly elevated in setting of prolonged downtime. Ck improving  Continue ivf, decreased rate to 21ml/hr Ck am labs  Hypertension: Currently stable.  Resume home amlodipine-benazepril tomorrow.  Elevated LFTs: Mildly elevated LFTs.  RUQ ultrasound shows changes suggestive of hepatic steatosis and small simple hepatic cyst, otherwise no acute issue.   Improving.   Generalized weakness: With prolonged downtime at home without reported fall or  loss of consciousness. -PT/OT eval- SNF rec. By PT  Hypokalemia- K is 2.8 today, will replace Ck am labs   DVT prophylaxis: Lovenox Code Status: Full Family Communication: None at bedside Disposition Plan: Back to previous home life Barrier: Patient is weak, still febrile, unable to take p.o. intake, needs IV antibiotics, needs snf, discussed with case mx  LOS: 2 days   Time spent: 45 minutes with more than 50% COC    Nolberto Hanlon, MD [facility] Pager [phone]  If 7PM-7AM, please contact night-coverage [email or web address] Password [facility] 08/29/2019, 11:39 AM Patient ID: Haley Mayo, female   DOB: 07/06/1950, 69 y.o.   MRN: [id number]

## 2019-08-30 ENCOUNTER — Encounter: Payer: Self-pay | Admitting: Internal Medicine

## 2019-08-30 ENCOUNTER — Inpatient Hospital Stay: Payer: Medicare Other

## 2019-08-30 DIAGNOSIS — I609 Nontraumatic subarachnoid hemorrhage, unspecified: Secondary | ICD-10-CM

## 2019-08-30 LAB — CBC
HCT: 41.5 % (ref 36.0–46.0)
Hemoglobin: 13.9 g/dL (ref 12.0–15.0)
MCH: 27.3 pg (ref 26.0–34.0)
MCHC: 33.5 g/dL (ref 30.0–36.0)
MCV: 81.4 fL (ref 80.0–100.0)
Platelets: 266 10*3/uL (ref 150–400)
RBC: 5.1 MIL/uL (ref 3.87–5.11)
RDW: 14.6 % (ref 11.5–15.5)
WBC: 20.4 10*3/uL — ABNORMAL HIGH (ref 4.0–10.5)
nRBC: 0 % (ref 0.0–0.2)

## 2019-08-30 LAB — COMPREHENSIVE METABOLIC PANEL
ALT: 39 U/L (ref 0–44)
AST: 21 U/L (ref 15–41)
Albumin: 3.1 g/dL — ABNORMAL LOW (ref 3.5–5.0)
Alkaline Phosphatase: 68 U/L (ref 38–126)
Anion gap: 10 (ref 5–15)
BUN: 10 mg/dL (ref 8–23)
CO2: 25 mmol/L (ref 22–32)
Calcium: 8.5 mg/dL — ABNORMAL LOW (ref 8.9–10.3)
Chloride: 104 mmol/L (ref 98–111)
Creatinine, Ser: 0.41 mg/dL — ABNORMAL LOW (ref 0.44–1.00)
GFR calc Af Amer: >60 mL/min (ref >60)
GFR calc non Af Amer: >60 mL/min (ref >60)
Glucose, Bld: 147 mg/dL — ABNORMAL HIGH (ref 70–99)
Potassium: 3.2 mmol/L — ABNORMAL LOW (ref 3.5–5.1)
Sodium: 139 mmol/L (ref 135–145)
Total Bilirubin: 0.7 mg/dL (ref 0.3–1.2)
Total Protein: 7.1 g/dL (ref 6.5–8.1)

## 2019-08-30 LAB — EPSTEIN-BARR VIRUS VCA, IGM: EBV VCA IgM: <36 U/mL (ref 0.0–35.9)

## 2019-08-30 LAB — CMV IGM: CMV IgM: <30 AU/mL (ref 0.0–29.9)

## 2019-08-30 LAB — MRSA PCR SCREENING: MRSA by PCR: NEGATIVE

## 2019-08-30 MED ORDER — IOHEXOL 350 MG/ML SOLN
75.0000 mL | Freq: Once | INTRAVENOUS | Status: AC | PRN
  Administered 2019-08-30: 75 mL via INTRAVENOUS

## 2019-08-30 MED ORDER — ACETAMINOPHEN 325 MG PO TABS: 650.0000 mg | ORAL_TABLET | Freq: Four times a day (QID) | ORAL | Status: DC | PRN

## 2019-08-30 MED ORDER — VANCOMYCIN HCL IN DEXTROSE 1-5 GM/200ML-% IV SOLN: 1000.0000 mg | Freq: Two times a day (BID) | INTRAVENOUS | Status: DC

## 2019-08-30 MED ORDER — POTASSIUM CHLORIDE 10 MEQ/100ML IV SOLN
10.0000 meq | INTRAVENOUS | Status: AC
  Administered 2019-08-30 (×4): 10 meq via INTRAVENOUS
  Filled 2019-08-30 (×3): qty 100

## 2019-08-30 MED ORDER — NIMODIPINE 30 MG PO CAPS: 60.0000 mg | ORAL_CAPSULE | ORAL | Status: DC

## 2019-08-30 MED ORDER — NIMODIPINE 6 MG/ML PO SOLN
60.0000 mg | ORAL | Status: DC
  Filled 2019-08-30: qty 10

## 2019-08-30 MED ORDER — NIMODIPINE 30 MG PO CAPS
60.0000 mg | ORAL_CAPSULE | ORAL | Status: DC
  Administered 2019-08-30: 60 mg via ORAL
  Filled 2019-08-30 (×4): qty 2

## 2019-08-30 MED ORDER — ENOXAPARIN SODIUM 40 MG/0.4ML ~~LOC~~ SOLN: 40.0000 mg | SUBCUTANEOUS | Status: DC

## 2019-08-30 MED ORDER — POLYETHYLENE GLYCOL 3350 17 G PO PACK: 17.0000 g | PACK | Freq: Every day | ORAL | 0 refills | Status: DC

## 2019-08-30 MED ORDER — STROKE: EARLY STAGES OF RECOVERY BOOK: Freq: Once | Status: DC

## 2019-08-30 MED ORDER — SODIUM CHLORIDE 0.9 % IV SOLN: 2.0000 g | Freq: Three times a day (TID) | INTRAVENOUS | Status: DC

## 2019-08-30 MED ORDER — VANCOMYCIN HCL 1250 MG/250ML IV SOLN: 1250.0000 mg | INTRAVENOUS | Status: DC

## 2019-08-30 MED ORDER — BENAZEPRIL HCL 20 MG PO TABS: 20.0000 mg | ORAL_TABLET | Freq: Every day | ORAL | Status: DC

## 2019-08-30 MED ORDER — VANCOMYCIN HCL 1250 MG/250ML IV SOLN
1250.0000 mg | INTRAVENOUS | Status: DC
  Filled 2019-08-30: qty 250

## 2019-08-30 MED ORDER — SODIUM CHLORIDE 0.9 % IV SOLN: INTRAVENOUS | Status: DC

## 2019-08-30 MED ORDER — POLYETHYLENE GLYCOL 3350 17 G PO PACK
17.0000 g | PACK | Freq: Every day | ORAL | Status: DC
  Administered 2019-08-30: 17 g via ORAL
  Filled 2019-08-30: qty 1

## 2019-08-30 MED ORDER — SODIUM CHLORIDE 0.9 % IV SOLN
2.0000 g | Freq: Three times a day (TID) | INTRAVENOUS | Status: DC
  Administered 2019-08-30 (×2): 2 g via INTRAVENOUS
  Filled 2019-08-30 (×4): qty 2

## 2019-08-30 MED ORDER — AMLODIPINE BESYLATE 10 MG PO TABS: 10.0000 mg | ORAL_TABLET | Freq: Every day | ORAL | Status: DC

## 2019-08-30 MED ORDER — POTASSIUM CHLORIDE CRYS ER 20 MEQ PO TBCR
40.0000 meq | EXTENDED_RELEASE_TABLET | Freq: Once | ORAL | Status: AC
  Administered 2019-08-30: 40 meq via ORAL
  Filled 2019-08-30: qty 2

## 2019-08-30 NOTE — Progress Notes (Signed)
Occupational Therapy Treatment Patient Details Name: Haley Mayo MRN: [id number] DOB: 28-May-1950 Today's Date: 08/30/2019    History of present illness Pt is 69 yo female that presented to ED for generalized weakness, pt stated she has been down on the floor at home since 5/7. Reported recent [place] vaccination (5/3). PMH of HLD, HTN.   OT comments  Haley Mayo was seen for OT treatment on this date. Upon arrival to room pt was awake reporting fatigue but agreeable to bed level tx session. Pt required MAX encouragement to participate in bed mobility this date. Pt instructed in importance of participation in mobility during ADLs for functional strengthening. Requiring MOD A for L+R rolling at bed level in preparation for bed pan use. Pt verbalized understanding of instruction provided. Pt making good progress toward goals. Pt continues to benefit from skilled OT services to maximize return to PLOF and minimize risk of future falls, injury, caregiver burden, and readmission. Will continue to follow POC. Discharge recommendation remains appropriate.    Follow Up Recommendations  SNF    Equipment Recommendations  3 in 1 bedside commode    Recommendations for Other Services      Precautions / Restrictions Precautions Precautions: Fall Restrictions Weight Bearing Restrictions: No       Mobility Bed Mobility Overal bed mobility: Needs Assistance Bed Mobility: Rolling Rolling: Mod assist(MOD A L+R rolling - holds position c MIN A )            Transfers                 General transfer comment: Pt deferred OOB mobility 2/2 pain and fatigue     Balance                                           ADL either performed or assessed with clinical judgement   ADL Overall ADL's : Needs assistance/impaired                                       General ADL Comments: MOD A L+R rolling at bed level simulated toilet t/f.      Vision        Perception     Praxis      Cognition Arousal/Alertness: Awake/alert Behavior During Therapy: WFL for tasks assessed/performed;Flat affect Overall Cognitive Status: Within Functional Limits for tasks assessed                                 General Comments: Pt appeared disengaged and had flat affect this date. Required MAX encouragement to engage in bed level mobility.         Exercises Exercises: Other exercises Other Exercises Other Exercises: Pt educated re: importance of mobility for functional strengthening, log roll for comfort, pain mangement Other Exercises: Bed mobility L+R rolling, therapeutic use of self   Shoulder Instructions       General Comments      Pertinent Vitals/ Pain       Pain Assessment: Faces Faces Pain Scale: Hurts a little bit Pain Location: R lateral thigh Pain Descriptors / Indicators: Grimacing;Aching Pain Intervention(s): Limited activity within patient's tolerance  Home Living  Prior Functioning/Environment              Frequency  Min 1X/week        Progress Toward Goals  OT Goals(current goals can now be found in the care plan section)  Progress towards OT goals: Progressing toward goals  Acute Rehab OT Goals Patient Stated Goal: to feel better OT Goal Formulation: With patient Time For Goal Achievement: 09/10/19 Potential to Achieve Goals: Good ADL Goals Pt Will Perform Grooming: with modified independence;standing;with adaptive equipment(C LRAD PRN for safety and improved functional independence.) Pt Will Perform Lower Body Dressing: with adaptive equipment;sit to/from stand;with set-up;with supervision(C LRAD PRN for safety and improved functional independence.) Pt Will Transfer to Toilet: regular height toilet;with set-up;with supervision;ambulating(C LRAD PRN for safety and improved functional independence.) Pt Will Perform Toileting -  Clothing Manipulation and hygiene: with set-up;with supervision;sit to/from stand;with adaptive equipment(C LRAD PRN for safety and improved functional independence.)  Plan Discharge plan remains appropriate;Frequency remains appropriate    Co-evaluation                 AM-PAC OT "6 Clicks" Daily Activity     Outcome Measure   Help from another person eating meals?: A Little Help from another person taking care of personal grooming?: A Little Help from another person toileting, which includes using toliet, bedpan, or urinal?: A Lot Help from another person bathing (including washing, rinsing, drying)?: A Lot Help from another person to put on and taking off regular upper body clothing?: A Little Help from another person to put on and taking off regular lower body clothing?: A Lot 6 Click Score: 15    End of Session    OT Visit Diagnosis: Other abnormalities of gait and mobility (R26.89);Muscle weakness (generalized) (M62.81)   Activity Tolerance Patient limited by fatigue   Patient Left in bed;with call bell/phone within reach;with bed alarm set   Nurse Communication          Time: [id number] OT Time Calculation (min): 11 min  Charges: OT General Charges $OT Visit: 1 Visit OT Treatments $Therapeutic Activity: 8-22 mins  Dessie Coma, M.S. OTR/L  08/30/19, 3:14 PM

## 2019-08-30 NOTE — Progress Notes (Signed)
   08/30/19 0735  Assess: MEWS Score  Temp (!) 102.8 F (39.3 C)  BP (!) 187/71  Pulse Rate 80  Resp 18  SpO2 99 %  Assess: MEWS Score  MEWS Temp 2  MEWS Systolic 0  MEWS Pulse 0  MEWS RR 0  MEWS LOC 0  MEWS Score 2  MEWS Score Color Yellow  Assess: if the MEWS score is Yellow or Red  Were vital signs taken at a resting state? Yes  Focused Assessment Documented focused assessment  Treat  MEWS Interventions Administered prn meds/treatments  Take Vital Signs  Increase Vital Sign Frequency  Yellow: Q 2hr X 2 then Q 4hr X 2, if remains yellow, continue Q 4hrs  Notify: Provider  Provider Name/Title amery  Date Provider Notified 08/30/19  Time Provider Notified [phone]  Notification Type Rounds  Notification Reason Other (Comment) (temp elevated, stable)  Date of Provider Response 08/30/19  Time of Provider Response [phone]  Document  Patient Outcome Stabilized after interventions

## 2019-08-30 NOTE — Plan of Care (Signed)

## 2019-08-30 NOTE — Progress Notes (Signed)
1930 patient had a CT of head for c/o double vision. CT scan showed bleeding of the brain. On call NP [facility] notified of results by radiology. New order for CT of neck for futher eval.given. IV team called and placed right AC 20G PIV. NP notified writer of immediate need for patient to be transported to [place] called for transport. [place] arrived and prepared patient for transport. 2340 resident left unit enroute to [facility]. Patient left unit in good condition. Son Shawnie Dapper notified of above events and was given information to contact patient at [facility].

## 2019-08-30 NOTE — Progress Notes (Signed)
Patient complaining of double vision that has been going on apparently since shes been here. Dr Kurtis Bushman text paged.

## 2019-08-30 NOTE — Progress Notes (Signed)
PROGRESS NOTE    Haley Mayo  [id number] DOB: [birth year] DOA: 08/26/2019 PCP: Frazier Richards, MD    Brief Narrative:  Haley Mayo is a 69 y.o. female with medical history significant for hypertension who presents to the ED for evaluation of generalized weakness.  Patient states she received the first dose of [place] COVID-19 vaccination on 08/18/2019.  She says 3 days later she was working outside for about 20 minutes when she began to feel unwell.  She went inside and had nausea with emesis.  She is felt extremely weak/lethargic and tried taking a shower without improvement.  She says she has been lying on the floor since that time due to her weakness.  She says she has not fallen but was scared of falling when asked why she has been lying on the floor rather than getting into bed.  She did not lose consciousness.  She denies any chest pain or dyspnea.  She has had decreased oral intake.  She denies any abdominal pain, dysuria, or diarrhea.  Per ED triage documentation police were called by her son for well check and she was found on the floor.  She is noted to be hypertensive with BP 192/67 and CBG 167.    Consultants:     Procedures:   Antimicrobials:   Ceftriaxone and azithromycin   Subjective: Pt has no complaints. Denies sob, diarrhea, abd pain, vomiting. Reports no BM. Reports being sleepy   Objective: Vitals:   08/30/19 0111 08/30/19 0735 08/30/19 1003 08/30/19 1202  BP: (!) 158/77 (!) 187/71 (!) 152/68 131/68  Pulse: 72 80 78 77  Resp: 20 18 18 16   Temp: 98.8 F (37.1 C) (!) 102.8 F (39.3 C) (!) 101 F (38.3 C) 98.2 F (36.8 C)  TempSrc: Oral Oral Oral Oral  SpO2: 97% 99% 96% 96%  Weight:      Height:        Intake/Output Summary (Last 24 hours) at 08/30/2019 1240 Last data filed at 08/30/2019 1025 Gross per 24 hour  Intake 2368.56 ml  Output 1200 ml  Net 1168.56 ml   Filed Weights   08/26/19 1625  Weight: 73.5 kg     Examination:  General exam: Appears calm and comfortable, nad Respiratory system: midly rhonchorus Cardiovascular system: S1 & S2 heard, RRR. No JVD, murmurs, rubs, gallops or clicks.  Gastrointestinal system: Abdomen is nondistended, soft and nontender.. +bs Central nervous system: Alert and oriented.   Extremities: No edema  Skin: Warm and dry Psychiatry:  Mood & affect appropriate in current setting.     Data Reviewed: I have personally reviewed following labs and imaging studies  CBC: Recent Labs  Lab 08/26/19 1638 08/27/19 0453 08/28/19 0510 08/29/19 0439 08/30/19 0702  WBC 27.1* 22.9* 19.1* 18.2* 20.4*  NEUTROABS 23.6*  --   --   --   --   HGB 15.4* 13.0 13.5 13.8 13.9  HCT 47.1* 40.1 40.5 39.6 41.5  MCV 84.3 85.1 82.0 80.8 81.4  PLT 304 270 262 251 [id number]   Basic Metabolic Panel: Recent Labs  Lab 08/26/19 1638 08/26/19 1638 08/27/19 0453 08/28/19 0510 08/29/19 0439 08/29/19 1857 08/30/19 0702  NA 145  --  144 142 139  --  139  K 4.5   < > 2.9* 2.6* 2.8* 4.3 3.2*  CL 110  --  112* 109 106  --  104  CO2 23  --  24 24 24   --  25  GLUCOSE 166*  --  163* 132* 151*  --  147*  BUN 28*  --  17 9 9   --  10  CREATININE 0.81  --  0.58 0.49 0.49  --  0.41*  CALCIUM 9.4  --  8.6* 8.2* 8.3*  --  8.5*   < > = values in this interval not displayed.   GFR: Estimated Creatinine Clearance: 61.1 mL/min (A) (by C-G formula based on SCr of 0.41 mg/dL (L)). Liver Function Tests: Recent Labs  Lab 08/26/19 1638 08/27/19 0453 08/28/19 0510 08/30/19 0702  AST 99* 47* 30 21  ALT 71* 53* 45* 39  ALKPHOS 77 69 63 68  BILITOT 1.3* 0.7 0.9 0.7  PROT 8.4* 7.0 6.8 7.1  ALBUMIN 4.0 3.3* 3.1* 3.1*   No results for input(s): LIPASE, AMYLASE in the last 168 hours. No results for input(s): AMMONIA in the last 168 hours. Coagulation Profile: No results for input(s): INR, PROTIME in the last 168 hours. Cardiac Enzymes: Recent Labs  Lab 08/26/19 1638 08/27/19 0453  08/28/19 0510 08/29/19 0439  CKTOTAL 3,162* 1,685* 711* 366*   BNP (last 3 results) No results for input(s): PROBNP in the last 8760 hours. HbA1C: No results for input(s): HGBA1C in the last 72 hours. CBG: No results for input(s): GLUCAP in the last 168 hours. Lipid Profile: No results for input(s): CHOL, HDL, LDLCALC, TRIG, CHOLHDL, LDLDIRECT in the last 72 hours. Thyroid Function Tests: No results for input(s): TSH, T4TOTAL, FREET4, T3FREE, THYROIDAB in the last 72 hours. Anemia Panel: No results for input(s): VITAMINB12, FOLATE, FERRITIN, TIBC, IRON, RETICCTPCT in the last 72 hours. Sepsis Labs: Recent Labs  Lab 08/26/19 1638  PROCALCITON 0.12  LATICACIDVEN 1.2    Recent Results (from the past 240 hour(s))  SARS Coronavirus 2 by RT PCR (hospital order, performed in [facility] hospital lab) Nasopharyngeal Nasopharyngeal Swab     Status: None   Collection Time: 08/26/19  5:23 PM   Specimen: Nasopharyngeal Swab  Result Value Ref Range Status   SARS Coronavirus 2 NEGATIVE NEGATIVE Final    Comment: (NOTE) SARS-CoV-2 target nucleic acids are NOT DETECTED. The SARS-CoV-2 RNA is generally detectable in upper and lower respiratory specimens during the acute phase of infection. The lowest concentration of SARS-CoV-2 viral copies this assay can detect is 250 copies / mL. A negative result does not preclude SARS-CoV-2 infection and should not be used as the sole basis for treatment or other patient management decisions.  A negative result may occur with improper specimen collection / handling, submission of specimen other than nasopharyngeal swab, presence of viral mutation(s) within the areas targeted by this assay, and inadequate number of viral copies (<250 copies / mL). A negative result must be combined with clinical observations, patient history, and epidemiological information. Fact Sheet for Patients:   StrictlyIdeas.no Fact Sheet for Healthcare  Providers: BankingDealers.co.za This test is not yet approved or cleared  by the [place] FDA and has been authorized for detection and/or diagnosis of SARS-CoV-2 by FDA under an Emergency Use Authorization (EUA).  This EUA will remain in effect (meaning this test can be used) for the duration of the COVID-19 declaration under Section 564(b)(1) of the Act, 21 U.S.C. section 360bbb-3(b)(1), unless the authorization is terminated or revoked sooner. Performed at [facility], [address] [place], [place] [address]   CULTURE, BLOOD (ROUTINE X 2) w Reflex to ID Panel     Status: None (Preliminary result)   Collection Time: 08/28/19  8:37 AM   Specimen: BLOOD  Result Value Ref Range Status   Specimen Description BLOOD BLOOD RIGHT HAND  Final   Special Requests   Final    BOTTLES DRAWN AEROBIC AND ANAEROBIC Blood Culture results may not be optimal due to an excessive volume of blood received in culture bottles   Culture   Final    NO GROWTH 2 DAYS Performed at [facility], [address] [place], [place] [address]    Report Status PENDING  Incomplete  CULTURE, BLOOD (ROUTINE X 2) w Reflex to ID Panel     Status: None (Preliminary result)   Collection Time: 08/28/19  8:45 AM   Specimen: BLOOD  Result Value Ref Range Status   Specimen Description BLOOD RIGHT ANTECUBITAL  Final   Special Requests   Final    BOTTLES DRAWN AEROBIC AND ANAEROBIC Blood Culture adequate volume   Culture   Final    NO GROWTH 2 DAYS Performed at [facility], [address], [place], [place] [address]    Report Status PENDING  Incomplete  Urine Culture     Status: None   Collection Time: 08/28/19 10:11 AM   Specimen: Urine, Random  Result Value Ref Range Status   Specimen Description   Final    URINE, RANDOM Performed at [facility], [address], [place], [place] [address]    Special Requests   Final    NONE Performed at [facility], [address], [place], [place] [address]    Culture   Final    NO GROWTH Performed at [facility], [place] [address], [place], [place] [address]    Report Status 08/29/2019 FINAL  Final         Radiology Studies: ECHOCARDIOGRAM COMPLETE  Result Date: 08/29/2019    ECHOCARDIOGRAM REPORT   Patient Name:   Haley Mayo Date of Exam: 08/29/2019 Medical Rec #:  [id number]           Height:       60.0 in Accession #:    [id number]          Weight:       162.0 lb Date of Birth:  September 25, 1950          BSA:          1.707 m Patient Age:    18 years            BP:           130/64 mmHg Patient Gender: F                   HR:           75 bpm. Exam Location:  [facility] Procedure: 2D Echo                                 MODIFIED REPORT:    This report was modified by Yolonda Kida MD on 08/29/2019 due to Mild                              diastolic dysfunction.  Indications:     Cardiomegaly 429.3/ I51.7  History:         Patient has no prior history of Echocardiogram examinations.  Sonographer:     Arville Go RDCS Referring Phys:  [id number] [facility] Diagnosing Phys: Yolonda Kida MD  Sonographer Comments: Technically challenging study due to limited acoustic windows, Technically difficult study due to poor echo windows and suboptimal parasternal window. Image acquisition challenging due to patient body habitus. IMPRESSIONS  1. Left ventricular ejection fraction, by estimation, is 55 to 60%. The left ventricle has normal function. The left ventricle has no regional wall motion abnormalities. Left ventricular diastolic parameters are consistent with Grade I diastolic dysfunction (impaired relaxation).  2. Right ventricular systolic function is normal. The right ventricular size is normal.  3. The mitral valve is normal in structure. No evidence of mitral valve regurgitation.  4. The aortic valve is normal in structure. Aortic valve regurgitation is not visualized. Mild aortic valve  sclerosis is present, with no evidence of aortic valve stenosis. FINDINGS  Left Ventricle: Left ventricular ejection fraction, by estimation, is 55 to 60%. The left ventricle has normal function. The left ventricle has no regional wall motion abnormalities. The left ventricular internal cavity size was normal in size. There is  no left ventricular hypertrophy. Left ventricular diastolic parameters are consistent with Grade I diastolic dysfunction (impaired relaxation). Right Ventricle: The right ventricular size is normal. No increase in right ventricular wall thickness. Right ventricular systolic function is normal. Left Atrium: Left atrial size was normal in size. Right Atrium: Right atrial size was normal in size. Pericardium: The pericardium was not assessed. Mitral Valve: The mitral valve is normal in structure. No evidence of mitral valve regurgitation. Tricuspid Valve: The tricuspid valve is normal in structure. Tricuspid valve regurgitation is mild. Aortic Valve: The aortic valve is normal in structure. Aortic valve regurgitation is not visualized. Mild aortic valve sclerosis is present, with no evidence of aortic valve stenosis. Aortic valve peak gradient measures 15.7 mmHg. Pulmonic Valve: The pulmonic valve was normal in structure. Pulmonic valve regurgitation is not visualized. Aorta: The aortic arch was not well visualized. IAS/Shunts: No atrial level shunt detected by color flow Doppler.  LEFT VENTRICLE PLAX 2D LVIDd:         4.05 cm  Diastology LVIDs:         2.90 cm  LV e' lateral:   5.33 cm/s LV PW:         1.45 cm  LV E/e' lateral: 13.3 LV IVS:        1.47 cm  LV e' medial:    4.13 cm/s LVOT diam:     2.10 cm  LV E/e' medial:  17.1 LV SV:         62 LV SV Index:   36 LVOT Area:     3.46 cm  RIGHT VENTRICLE RV Basal diam:  2.63 cm RV S prime:     14.50 cm/s LEFT ATRIUM           Index       RIGHT ATRIUM           Index LA diam:      2.90 cm 1.70 cm/m  RA Area:     12.80 cm LA Vol (A2C): 23.2 ml  13.59 ml/m RA Volume:   27.90 ml  16.35 ml/m LA Vol (A4C): 24.9 ml 14.59 ml/m  AORTIC VALVE                PULMONIC VALVE AV Area (Vmax): 1.61 cm    PV Vmax:       1.17 m/s AV Vmax:        198.00 cm/s PV Peak grad:  5.5 mmHg AV Peak Grad:   15.7 mmHg LVOT  Vmax:      92.00 cm/s LVOT Vmean:     55.700 cm/s LVOT VTI:       0.178 m  AORTA Ao Root diam: 2.70 cm Ao Asc diam:  3.10 cm MITRAL VALVE MV Area (PHT): 3.85 cm    SHUNTS MV Decel Time: 197 msec    Systemic VTI:  0.18 m MV E velocity: 70.70 cm/s  Systemic Diam: 2.10 cm MV A velocity: 81.80 cm/s MV E/A ratio:  0.86 Dwayne D Callwood MD Electronically signed by Yolonda Kida MD Signature Date/Time: 08/29/2019/4:14:02 PM    Final (Updated)         Scheduled Meds: . amLODipine  10 mg Oral Daily  . benazepril  20 mg Oral Daily  . enoxaparin (LOVENOX) injection  40 mg Subcutaneous Q24H   Continuous Infusions: . sodium chloride 75 mL/hr at 08/29/19 1915  . azithromycin 500 mg (08/29/19 1916)  . cefTRIAXone (ROCEPHIN)  IV 2 g (08/29/19 2102)  . potassium chloride 10 mEq (08/30/19 1144)  . vancomycin      Assessment & Plan:   Principal Problem:   Community acquired pneumonia Active Problems:   Essential hypertension, benign   Elevated CK   Generalized weakness   CAP (community acquired pneumonia)   Community-acquired pneumonia: Presented with leukocytosis, O2 saturation 90% on room air per EDP, tachypnea, and bibasilar infiltrates on chest x-ray.  Likely predisposed from prolonged downtime. -Continue IV ceftriaxone and azithromycin   Fever- Etiology is unclear.  Possibly viral related.  Spoke to ID on-call, Dr. Linus Salmons. recommended checking EBV and CMV IgM levels. Continue supportive care ucx and bcx so far neg. Will add vanco to regimen, consult pharmacy for dosing  Elevated CK: Mildly elevated in setting of prolonged downtime. Ck improving  Continue ivf, decreased rate to 31ml/hr   Hypertension: Currently stable.    Continue amlodipine-benazepril  Elevated LFTs: Mildly elevated LFTs.  RUQ ultrasound shows changes suggestive of hepatic steatosis and small simple hepatic cyst, otherwise no acute issue.   Improving.   Generalized weakness: With prolonged downtime at home without reported fall or loss of consciousness. -PT/OT eval- SNF rec. By PT  Hypokalemia- K is 3.2 today, will replace Ck am labs   DVT prophylaxis: Lovenox Code Status: Full Family Communication: None at bedside Disposition Plan: Back to previous home life Barrier: Patient is weak, still febrile, unable to take p.o. intake well needs IV antibiotics, needs snf       LOS: 3 days   Time spent: 45 minutes with more than 50% COC    Nolberto Hanlon, MD [facility] Pager [phone]  If 7PM-7AM, please contact night-coverage [email or web address] Password [email or web address] 08/30/2019, 12:40 PM Patient ID: Haley Mayo, female   DOB: 1950-12-28, 69 y.o.   MRN: [id number]

## 2019-08-30 NOTE — Consult Note (Signed)
Pharmacy Antibiotic Note  Haley Mayo is a 69 y.o. female with history of hypertension and smoking admitted on 08/26/2019 with pneumonia.  Pharmacy has been consulted for vancomycin and cefepime dosing. Patient is also on azithromycin.   Patient has received empiric therapy with ceftriaxone + azithromycin x 4 days. WBC remains elevated at 20.4 today. Patient continues to be intermittently febrile despite therapy with Tmax 102.8 last 24h. Cultures negative to date. ID has been consulted.  Plan: Cefepime 2 g IV q8h  Vancomycin 1250 mg IV Q 24 hrs. Goal AUC 400-550. Expected AUC: 523.9, trough 13 SCr used: 0.8  Continue to follow culture data , ID plan , daily Scr per protocol while on vancomycin  Height: 5' (152.4 cm) Weight: 73.5 kg (162 lb) IBW/kg (Calculated) : 45.5  Temp (24hrs), Avg:100.1 F (37.8 C), Min:98.2 F (36.8 C), Max:102.8 F (39.3 C)  Recent Labs  Lab 08/26/19 1638 08/27/19 0453 08/28/19 0510 08/29/19 0439 08/30/19 0702  WBC 27.1* 22.9* 19.1* 18.2* 20.4*  CREATININE 0.81 0.58 0.49 0.49 0.41*  LATICACIDVEN 1.2  --   --   --   --     Estimated Creatinine Clearance: 61.1 mL/min (A) (by C-G formula based on SCr of 0.41 mg/dL (L)).    No Known Allergies  Antimicrobials this admission: Ceftriaxone 5/12 >> 5/15 Azithromycin 5/12 >>  Vancomycin 5/16 >> Cefepime 5/16 >>  Dose adjustments this admission: n/a  Microbiology results: 5/16 BCx: pending 5/14 BCx: NG x 2 days 5/14 UCx: no growth 5/16 MRSA PCR: pending  Thank you for allowing pharmacy to be a part of this patient's care.  [place] Resident 08/30/2019 1:17 PM

## 2019-08-30 NOTE — Discharge Summary (Signed)
Physician Discharge Summary  JULANA CAMBRIDGE [id number] DOB: 1950-07-22 DOA: 08/26/2019  PCP: Frazier Richards, MD  Admit date: 08/26/2019 Discharge date: 08/30/2019  Admitted From: Home Disposition:  [facility] to [facility]  Discharge Condition: Guarded CODE STATUS: Full Diet recommendation: Heart Healthy   Brief/Interim Summary: Emmalea I Nartatezis a 69 y.o.femalewith medical history significant forhypertension, admitted on 08/28/2019 with sudden onset weakness and lethargy to where she had to lie down on the floor, not being able to get up for fear of falling.  Work-up revealed bilateral pneumonia and she was started on antibiotic therapy.  On 08/30/2019,patient developed a fever and complained of diplopia and persistet headache.  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  CTA head and neck pending.  Neurosurgeon, Dr. Trenton Gammon has accepted patient in transfer to the neuro ICU at [facility].  Patient is stable for transfer.  Discharge Diagnoses:  Principal Problem:   Subarachnoid hemorrhage ([place]) Active Problems:   Essential hypertension, benign   Community acquired pneumonia   Elevated CK   Generalized weakness   CAP (community acquired pneumonia)    Discharge Instructions  Discharge Instructions    Diet - low sodium heart healthy   Complete by: As directed    Increase activity slowly   Complete by: As directed      Allergies as of 08/30/2019   No Known Allergies     Contact information for after-discharge care    [facility] .   Service: Skilled Nursing Contact information: [place] [phone]             No Known Allergies  Consultations:    Procedures/Studies: CT HEAD WO CONTRAST  Result Date: 08/30/2019 CLINICAL DATA:  Diplopia EXAM: CT HEAD  WITHOUT CONTRAST TECHNIQUE: Contiguous axial images were obtained from the base of the skull through the vertex without intravenous contrast. COMPARISON:  None. FINDINGS: Brain: There is acute subarachnoid hemorrhage over both cerebral hemispheres. Small amount of blood within the occipital horns of the lateral ventricles. There is a small amount of subarachnoid blood within the cisterna magna. There is subdural blood along the tentorium cerebelli. No midline shift or other mass effect. No intraparenchymal hemorrhage. 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 This measures approximately 5 mm. Skull: The visualized skull base, calvarium and extracranial soft tissues are normal. Sinuses/Orbits: No fluid levels or advanced mucosal thickening of the visualized paranasal sinuses. No mastoid or middle ear effusion. The orbits are normal. IMPRESSION: Aneurysmal pattern subarachnoid hemorrhage over both convexities and extending into the ventricles and basal cisterns. 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 CTA of the head recommended for better characterization. Critical Value/emergent results were called by telephone at the time of interpretation on 08/30/2019 at 8:28 pm to provider Rufina Falco , who verbally acknowledged these results. Electronically Signed   By: Ulyses Jarred M.D.   On: 08/30/2019 20:28   DG Chest Port 1 View  Result Date: 08/28/2019 CLINICAL DATA:  Fever EXAM: PORTABLE CHEST 1 VIEW COMPARISON:  Two days ago FINDINGS: Mild linear opacity in the bilateral lung consistent with atelectasis or scarring. Cardiomegaly. No edema, effusion, or pneumothorax. IMPRESSION: Cardiomegaly and mild scarring or atelectasis. No focal consolidation or change from 2 days ago. Electronically Signed   By: Neva Seat.D.  On: 08/28/2019 10:16   DG Chest Portable 1 View  Result Date: 08/26/2019 CLINICAL DATA:  Weakness. EXAM: PORTABLE CHEST 1 VIEW COMPARISON:  None.  FINDINGS: Mild atelectasis and/or infiltrate is seen within the bilateral lung bases. There is no evidence of a pleural effusion or pneumothorax. The cardiac silhouette is mildly enlarged. The visualized skeletal structures are unremarkable. IMPRESSION: Mild bibasilar atelectasis and/or infiltrate. Electronically Signed   By: Virgina Norfolk M.D.   On: 08/26/2019 17:05   ECHOCARDIOGRAM COMPLETE  Result Date: 08/29/2019    ECHOCARDIOGRAM REPORT   Patient Name:   ANIKKA BREYFOGLE Date of Exam: 08/29/2019 Medical Rec #:  [id number]           Height:       60.0 in Accession #:    [id number]          Weight:       162.0 lb Date of Birth:  [birth year]          BSA:          1.707 m Patient Age:    75 years            BP:           130/64 mmHg Patient Gender: F                   HR:           75 bpm. Exam Location:  [facility] Procedure: 2D Echo                                 MODIFIED REPORT:    This report was modified by Yolonda Kida MD on 08/29/2019 due to Mild                              diastolic dysfunction.  Indications:     Cardiomegaly 429.3/ I51.7  History:         Patient has no prior history of Echocardiogram examinations.  Sonographer:     Arville Go RDCS Referring Phys:  [id number] [place] Diagnosing Phys: Yolonda Kida MD  Sonographer Comments: Technically challenging study due to limited acoustic windows, Technically difficult study due to poor echo windows and suboptimal parasternal window. Image acquisition challenging due to patient body habitus. IMPRESSIONS  1. Left ventricular ejection fraction, by estimation, is 55 to 60%. The left ventricle has normal function. The left ventricle has no regional wall motion abnormalities. Left ventricular diastolic parameters are consistent with Grade I diastolic dysfunction (impaired relaxation).  2. Right ventricular systolic function is normal. The right ventricular size is normal.  3. The mitral valve is normal in structure. No evidence of mitral  valve regurgitation.  4. The aortic valve is normal in structure. Aortic valve regurgitation is not visualized. Mild aortic valve sclerosis is present, with no evidence of aortic valve stenosis. FINDINGS  Left Ventricle: Left ventricular ejection fraction, by estimation, is 55 to 60%. The left ventricle has normal function. The left ventricle has no regional wall motion abnormalities. The left ventricular internal cavity size was normal in size. There is  no left ventricular hypertrophy. Left ventricular diastolic parameters are consistent with Grade I diastolic dysfunction (impaired relaxation). Right Ventricle: The right ventricular size is normal. No increase in right ventricular wall thickness. Right ventricular systolic function is normal. Left Atrium: Left atrial size was normal  in size. Right Atrium: Right atrial size was normal in size. Pericardium: The pericardium was not assessed. Mitral Valve: The mitral valve is normal in structure. No evidence of mitral valve regurgitation. Tricuspid Valve: The tricuspid valve is normal in structure. Tricuspid valve regurgitation is mild. Aortic Valve: The aortic valve is normal in structure. Aortic valve regurgitation is not visualized. Mild aortic valve sclerosis is present, with no evidence of aortic valve stenosis. Aortic valve peak gradient measures 15.7 mmHg. Pulmonic Valve: The pulmonic valve was normal in structure. Pulmonic valve regurgitation is not visualized. Aorta: The aortic arch was not well visualized. IAS/Shunts: No atrial level shunt detected by color flow Doppler.  LEFT VENTRICLE PLAX 2D LVIDd:         4.05 cm  Diastology LVIDs:         2.90 cm  LV e' lateral:   5.33 cm/s LV PW:         1.45 cm  LV E/e' lateral: 13.3 LV IVS:        1.47 cm  LV e' medial:    4.13 cm/s LVOT diam:     2.10 cm  LV E/e' medial:  17.1 LV SV:         62 LV SV Index:   36 LVOT Area:     3.46 cm  RIGHT VENTRICLE RV Basal diam:  2.63 cm RV S prime:     14.50 cm/s LEFT ATRIUM            Index       RIGHT ATRIUM           Index LA diam:      2.90 cm 1.70 cm/m  RA Area:     12.80 cm LA Vol (A2C): 23.2 ml 13.59 ml/m RA Volume:   27.90 ml  16.35 ml/m LA Vol (A4C): 24.9 ml 14.59 ml/m  AORTIC VALVE                PULMONIC VALVE AV Area (Vmax): 1.61 cm    PV Vmax:       1.17 m/s AV Vmax:        198.00 cm/s PV Peak grad:  5.5 mmHg AV Peak Grad:   15.7 mmHg LVOT Vmax:      92.00 cm/s LVOT Vmean:     55.700 cm/s LVOT VTI:       0.178 m  AORTA Ao Root diam: 2.70 cm Ao Asc diam:  3.10 cm MITRAL VALVE MV Area (PHT): 3.85 cm    SHUNTS MV Decel Time: 197 msec    Systemic VTI:  0.18 m MV E velocity: 70.70 cm/s  Systemic Diam: 2.10 cm MV A velocity: 81.80 cm/s MV E/A ratio:  0.86 Dwayne D Callwood MD Electronically signed by Yolonda Kida MD Signature Date/Time: 08/29/2019/4:14:02 PM    Final (Updated)    US ABDOMEN LIMITED RUQ  Result Date: 08/26/2019 CLINICAL DATA:  Elevated LFTs EXAM: ULTRASOUND ABDOMEN LIMITED RIGHT UPPER QUADRANT COMPARISON:  None FINDINGS: Gallbladder: No gallstones or wall thickening visualized. No sonographic Murphy sign noted by sonographer. Common bile duct: Diameter: 2.8 mm, nondilated Liver: Diffusely increased hepatic echogenicity with loss of definition of the portal triads and diminished posterior through transmission compatible with hepatic steatosis. Geographic region of hypoattenuation along the gallbladder fossa is compatible with focal fatty sparing. Anechoic 1.6 x 1.3 x 1 cm cyst in the left lobe liver. Additional 1 x 1.1 x 1 cm cyst in the right lobe liver. No concerning focal  lesion. Portal vein is patent on color Doppler imaging with normal direction of blood flow towards the liver. Other: None. IMPRESSION: Diffusely increased hepatic echogenicity most compatible with hepatic steatosis with focal fatty sparing along the gallbladder fossa. Small simple appearing hepatic cysts. No other acute or concerning right upper quadrant abnormality. Electronically  Signed   By: Lovena Le M.D.   On: 08/26/2019 18:40    (Echo, Carotid, EGD, Colonoscopy, ERCP)    Subjective:   Discharge Exam: Vitals:   08/30/19 1202 08/30/19 1558  BP: 131/68 (!) 154/79  Pulse: 77 76  Resp: 16 17  Temp: 98.2 F (36.8 C) 98.9 F (37.2 C)  SpO2: 96% 98%   Vitals:   08/30/19 0735 08/30/19 1003 08/30/19 1202 08/30/19 1558  BP: (!) 187/71 (!) 152/68 131/68 (!) 154/79  Pulse: 80 78 77 76  Resp: 18 18 16 17   Temp: (!) 102.8 F (39.3 C) (!) 101 F (38.3 C) 98.2 F (36.8 C) 98.9 F (37.2 C)  TempSrc: Oral Oral Oral   SpO2: 99% 96% 96% 98%  Weight:      Height:        General: Pt is alert, awake, not in acute distress Cardiovascular: RRR, S1/S2 +, no rubs, no gallops Respiratory: CTA bilaterally, no wheezing, no rhonchi Abdominal: Soft, NT, ND, bowel sounds + Extremities: no edema, no cyanosis Neurologic: alert and oriented x3. Diplopia. Neck:pain and stiffness    The results of significant diagnostics from this hospitalization (including imaging, microbiology, ancillary and laboratory) are listed below for reference.     Microbiology: Recent Results (from the past 240 hour(s))  SARS Coronavirus 2 by RT PCR (hospital order, performed in [facility] hospital lab) Nasopharyngeal Nasopharyngeal Swab     Status: None   Collection Time: 08/26/19  5:23 PM   Specimen: Nasopharyngeal Swab  Result Value Ref Range Status   SARS Coronavirus 2 NEGATIVE NEGATIVE Final    Comment: (NOTE) SARS-CoV-2 target nucleic acids are NOT DETECTED. The SARS-CoV-2 RNA is generally detectable in upper and lower respiratory specimens during the acute phase of infection. The lowest concentration of SARS-CoV-2 viral copies this assay can detect is 250 copies / mL. A negative result does not preclude SARS-CoV-2 infection and should not be used as the sole basis for treatment or other patient management decisions.  A negative result may occur with improper specimen  collection / handling, submission of specimen other than nasopharyngeal swab, presence of viral mutation(s) within the areas targeted by this assay, and inadequate number of viral copies (<250 copies / mL). A negative result must be combined with clinical observations, patient history, and epidemiological information. Fact Sheet for Patients:   StrictlyIdeas.no Fact Sheet for Healthcare Providers: BankingDealers.co.za This test is not yet approved or cleared  by the [place] FDA and has been authorized for detection and/or diagnosis of SARS-CoV-2 by FDA under an Emergency Use Authorization (EUA).  This EUA will remain in effect (meaning this test can be used) for the duration of the COVID-19 declaration under Section 564(b)(1) of the Act, 21 U.S.C. section 360bbb-3(b)(1), unless the authorization is terminated or revoked sooner. Performed at [facility], [address] [place], [place] [address]   CULTURE, BLOOD (ROUTINE X 2) w Reflex to ID Panel     Status: None (Preliminary result)   Collection Time: 08/28/19  8:37 AM   Specimen: BLOOD  Result Value Ref Range Status   Specimen Description BLOOD BLOOD RIGHT HAND  Final   Special Requests  Final    BOTTLES DRAWN AEROBIC AND ANAEROBIC Blood Culture results may not be optimal due to an excessive volume of blood received in culture bottles   Culture   Final    NO GROWTH 2 DAYS Performed at [facility], [address] [place], [place] [address]    Report Status PENDING  Incomplete  CULTURE, BLOOD (ROUTINE X 2) w Reflex to ID Panel     Status: None (Preliminary result)   Collection Time: 08/28/19  8:45 AM   Specimen: BLOOD  Result Value Ref Range Status   Specimen Description BLOOD RIGHT ANTECUBITAL  Final   Special Requests   Final    BOTTLES DRAWN AEROBIC AND ANAEROBIC Blood Culture adequate volume   Culture   Final    NO GROWTH 2 DAYS Performed at  [facility], [address], [place], [place] [address]    Report Status PENDING  Incomplete  Urine Culture     Status: None   Collection Time: 08/28/19 10:11 AM   Specimen: Urine, Random  Result Value Ref Range Status   Specimen Description   Final    URINE, RANDOM Performed at [facility], [address], [place], [place] [address]    Special Requests   Final    NONE Performed at [facility], [address] [place], [place] [address]    Culture   Final    NO GROWTH Performed at [facility], [place] [address], [place], [place] [address]    Report Status 08/29/2019 FINAL  Final  MRSA PCR Screening     Status: None   Collection Time: 08/30/19  4:07 PM   Specimen: Nasopharyngeal  Result Value Ref Range Status   MRSA by PCR NEGATIVE NEGATIVE Final    Comment:        The GeneXpert MRSA Assay (FDA approved for NASAL specimens only), is one component of a comprehensive MRSA colonization surveillance program. It is not intended to diagnose MRSA infection nor to guide or monitor treatment for MRSA infections. Performed at [facility], [address] [place], [place] [address]      Labs: BNP (last 3 results) No results for input(s): BNP in the last 8760 hours. Basic Metabolic Panel: Recent Labs  Lab 08/26/19 1638 08/26/19 1638 08/27/19 0453 08/28/19 0510 08/29/19 0439 08/29/19 1857 08/30/19 0702  NA 145  --  144 142 139  --  139  K 4.5   < > 2.9* 2.6* 2.8* 4.3 3.2*  CL 110  --  112* 109 106  --  104  CO2 23  --  24 24 24   --  25  GLUCOSE 166*  --  163* 132* 151*  --  147*  BUN 28*  --  17 9 9   --  10  CREATININE 0.81  --  0.58 0.49 0.49  --  0.41*  CALCIUM 9.4  --  8.6* 8.2* 8.3*  --  8.5*   < > = values in this interval not displayed.   Liver Function Tests: Recent Labs  Lab 08/26/19 1638 08/27/19 0453 08/28/19 0510 08/30/19 0702  AST 99* 47* 30 21  ALT 71* 53* 45* 39  ALKPHOS 77 69 63 68  BILITOT 1.3*  0.7 0.9 0.7  PROT 8.4* 7.0 6.8 7.1  ALBUMIN 4.0 3.3* 3.1* 3.1*   No results for input(s): LIPASE, AMYLASE in the last 168 hours. No results for input(s): AMMONIA in the last 168 hours. CBC: Recent Labs  Lab 08/26/19 1638 08/27/19 0453 08/28/19 0510 08/29/19 0439 08/30/19 0702  WBC 27.1* 22.9* 19.1* 18.2* 20.4*  NEUTROABS 23.6*  --   --   --   --   HGB 15.4* 13.0 13.5 13.8 13.9  HCT 47.1* 40.1 40.5 39.6 41.5  MCV 84.3 85.1 82.0 80.8 81.4  PLT 304 270 262 251 266   Cardiac Enzymes: Recent Labs  Lab 08/26/19 1638 08/27/19 0453 08/28/19 0510 08/29/19 0439  CKTOTAL 3,162* 1,685* 711* 366*   BNP: Invalid input(s): POCBNP CBG: No results for input(s): GLUCAP in the last 168 hours. D-Dimer No results for input(s): DDIMER in the last 72 hours. Hgb A1c No results for input(s): HGBA1C in the last 72 hours. Lipid Profile No results for input(s): CHOL, HDL, LDLCALC, TRIG, CHOLHDL, LDLDIRECT in the last 72 hours. Thyroid function studies No results for input(s): TSH, T4TOTAL, T3FREE, THYROIDAB in the last 72 hours.  Invalid input(s): FREET3 Anemia work up No results for input(s): VITAMINB12, FOLATE, FERRITIN, TIBC, IRON, RETICCTPCT in the last 72 hours. Urinalysis    Component Value Date/Time   COLORURINE YELLOW (A) 08/28/2019 1011   APPEARANCEUR CLEAR (A) 08/28/2019 1011   APPEARANCEUR Clear 01/25/2016 1045   LABSPEC 1.016 08/28/2019 1011   PHURINE 6.0 08/28/2019 1011   GLUCOSEU NEGATIVE 08/28/2019 [place] 08/28/2019 [place] 08/28/2019 1011   BILIRUBINUR Negative 01/25/2016 [place] 08/28/2019 [place] 08/28/2019 1011   NITRITE NEGATIVE 08/28/2019 [place] 08/28/2019 1011   Sepsis Labs Invalid input(s): PROCALCITONIN,  WBC,  LACTICIDVEN Microbiology Recent Results (from the past 240 hour(s))  SARS Coronavirus 2 by RT PCR (hospital order, performed in [facility] hospital lab)  Nasopharyngeal Nasopharyngeal Swab     Status: None   Collection Time: 08/26/19  5:23 PM   Specimen: Nasopharyngeal Swab  Result Value Ref Range Status   SARS Coronavirus 2 NEGATIVE NEGATIVE Final    Comment: (NOTE) SARS-CoV-2 target nucleic acids are NOT DETECTED. The SARS-CoV-2 RNA is generally detectable in upper and lower respiratory specimens during the acute phase of infection. The lowest concentration of SARS-CoV-2 viral copies this assay can detect is 250 copies / mL. A negative result does not preclude SARS-CoV-2 infection and should not be used as the sole basis for treatment or other patient management decisions.  A negative result may occur with improper specimen collection / handling, submission of specimen other than nasopharyngeal swab, presence of viral mutation(s) within the areas targeted by this assay, and inadequate number of viral copies (<250 copies / mL). A negative result must be combined with clinical observations, patient history, and epidemiological information. Fact Sheet for Patients:   StrictlyIdeas.no Fact Sheet for Healthcare Providers: BankingDealers.co.za This test is not yet approved or cleared  by the [place] FDA and has been authorized for detection and/or diagnosis of SARS-CoV-2 by FDA under an Emergency Use Authorization (EUA).  This EUA will remain in effect (meaning this test can be used) for the duration of the COVID-19 declaration under Section 564(b)(1) of the Act, 21 U.S.C. section 360bbb-3(b)(1), unless the authorization is terminated or revoked sooner. Performed at [facility], [address] [place], [place] [address]   CULTURE, BLOOD (ROUTINE X 2) w Reflex to ID Panel     Status: None (Preliminary result)   Collection Time: 08/28/19  8:37 AM   Specimen: BLOOD  Result Value Ref Range Status   Specimen Description BLOOD BLOOD RIGHT  HAND  Final   Special Requests   Final     BOTTLES DRAWN AEROBIC AND ANAEROBIC Blood Culture results may not be optimal due to an excessive volume of blood received in culture bottles   Culture   Final    NO GROWTH 2 DAYS Performed at [facility], [address], [place], [place] [address]    Report Status PENDING  Incomplete  CULTURE, BLOOD (ROUTINE X 2) w Reflex to ID Panel     Status: None (Preliminary result)   Collection Time: 08/28/19  8:45 AM   Specimen: BLOOD  Result Value Ref Range Status   Specimen Description BLOOD RIGHT ANTECUBITAL  Final   Special Requests   Final    BOTTLES DRAWN AEROBIC AND ANAEROBIC Blood Culture adequate volume   Culture   Final    NO GROWTH 2 DAYS Performed at [facility], [address] [place], [place] [address]    Report Status PENDING  Incomplete  Urine Culture     Status: None   Collection Time: 08/28/19 10:11 AM   Specimen: Urine, Random  Result Value Ref Range Status   Specimen Description   Final    URINE, RANDOM Performed at [facility], [address] [place], [place] [address]    Special Requests   Final    NONE Performed at [facility], [address] [place], [place] [address]    Culture   Final    NO GROWTH Performed at [facility], [place] [address] [place], [place] [address]    Report Status 08/29/2019 FINAL  Final  MRSA PCR Screening     Status: None   Collection Time: 08/30/19  4:07 PM   Specimen: Nasopharyngeal  Result Value Ref Range Status   MRSA by PCR NEGATIVE NEGATIVE Final    Comment:        The GeneXpert MRSA Assay (FDA approved for NASAL specimens only), is one component of a comprehensive MRSA colonization surveillance program. It is not intended to diagnose MRSA infection nor to guide or monitor treatment for MRSA infections. Performed at [facility], [address], [place],  [address]      Time coordinating discharge: Over 30 minutes  SIGNED:   Athena Masse,  MD  [facility] 08/30/2019, 10:08 PM Pager   If 7PM-7AM, please contact night-coverage [email or web address] Password [email or web address]

## 2019-08-30 NOTE — Progress Notes (Signed)
CT head ordered by Dr Kurtis Bushman

## 2019-08-30 NOTE — Progress Notes (Signed)
Pt sent to [facility] via [facility]. Rm 17 on 4 [place].

## 2019-08-31 ENCOUNTER — Inpatient Hospital Stay (HOSPITAL_COMMUNITY): Payer: Medicare Other

## 2019-08-31 ENCOUNTER — Inpatient Hospital Stay (HOSPITAL_COMMUNITY)
Admission: AD | Admit: 2019-08-31 | Discharge: 2019-09-10 | DRG: 020 | Disposition: A | Discharge status: Rehabilitation Facility | Payer: Medicare Other | Source: Other Acute Inpatient Hospital | Attending: Neurosurgery | Admitting: Neurosurgery

## 2019-08-31 DIAGNOSIS — I69091 Dysphagia following nontraumatic subarachnoid hemorrhage: Secondary | ICD-10-CM | POA: Diagnosis not present

## 2019-08-31 DIAGNOSIS — I952 Hypotension due to drugs: Secondary | ICD-10-CM | POA: Diagnosis not present

## 2019-08-31 DIAGNOSIS — R197 Diarrhea, unspecified: Secondary | ICD-10-CM | POA: Diagnosis not present

## 2019-08-31 DIAGNOSIS — Z20822 Contact with and (suspected) exposure to covid-19: Secondary | ICD-10-CM | POA: Diagnosis present

## 2019-08-31 DIAGNOSIS — H532 Diplopia: Secondary | ICD-10-CM | POA: Diagnosis present

## 2019-08-31 DIAGNOSIS — I69098 Other sequelae following nontraumatic subarachnoid hemorrhage: Secondary | ICD-10-CM | POA: Diagnosis present

## 2019-08-31 DIAGNOSIS — I119 Hypertensive heart disease without heart failure: Secondary | ICD-10-CM | POA: Diagnosis present

## 2019-08-31 DIAGNOSIS — Z832 Family history of diseases of the blood and blood-forming organs and certain disorders involving the immune mechanism: Secondary | ICD-10-CM

## 2019-08-31 DIAGNOSIS — Z452 Encounter for adjustment and management of vascular access device: Secondary | ICD-10-CM

## 2019-08-31 DIAGNOSIS — Z833 Family history of diabetes mellitus: Secondary | ICD-10-CM

## 2019-08-31 DIAGNOSIS — M6282 Rhabdomyolysis: Secondary | ICD-10-CM | POA: Diagnosis present

## 2019-08-31 DIAGNOSIS — Z823 Family history of stroke: Secondary | ICD-10-CM

## 2019-08-31 DIAGNOSIS — I272 Pulmonary hypertension, unspecified: Secondary | ICD-10-CM | POA: Diagnosis present

## 2019-08-31 DIAGNOSIS — F17210 Nicotine dependence, cigarettes, uncomplicated: Secondary | ICD-10-CM | POA: Diagnosis present

## 2019-08-31 DIAGNOSIS — E669 Obesity, unspecified: Secondary | ICD-10-CM | POA: Diagnosis present

## 2019-08-31 DIAGNOSIS — R339 Retention of urine, unspecified: Secondary | ICD-10-CM | POA: Diagnosis present

## 2019-08-31 DIAGNOSIS — J189 Pneumonia, unspecified organism: Secondary | ICD-10-CM | POA: Diagnosis present

## 2019-08-31 DIAGNOSIS — I619 Nontraumatic intracerebral hemorrhage, unspecified: Secondary | ICD-10-CM | POA: Diagnosis present

## 2019-08-31 DIAGNOSIS — Z8261 Family history of arthritis: Secondary | ICD-10-CM

## 2019-08-31 DIAGNOSIS — Z95828 Presence of other vascular implants and grafts: Secondary | ICD-10-CM | POA: Diagnosis not present

## 2019-08-31 DIAGNOSIS — I2699 Other pulmonary embolism without acute cor pulmonale: Secondary | ICD-10-CM | POA: Diagnosis present

## 2019-08-31 DIAGNOSIS — I69391 Dysphagia following cerebral infarction: Secondary | ICD-10-CM | POA: Diagnosis not present

## 2019-08-31 DIAGNOSIS — Z90710 Acquired absence of both cervix and uterus: Secondary | ICD-10-CM | POA: Diagnosis not present

## 2019-08-31 DIAGNOSIS — I604 Nontraumatic subarachnoid hemorrhage from basilar artery: Principal | ICD-10-CM | POA: Diagnosis present

## 2019-08-31 DIAGNOSIS — I609 Nontraumatic subarachnoid hemorrhage, unspecified: Secondary | ICD-10-CM

## 2019-08-31 DIAGNOSIS — I67848 Other cerebrovascular vasospasm and vasoconstriction: Secondary | ICD-10-CM | POA: Diagnosis not present

## 2019-08-31 DIAGNOSIS — Z6831 Body mass index (BMI) 31.0-31.9, adult: Secondary | ICD-10-CM

## 2019-08-31 DIAGNOSIS — G9349 Other encephalopathy: Secondary | ICD-10-CM | POA: Diagnosis present

## 2019-08-31 DIAGNOSIS — I700 Atherosclerosis of aorta: Secondary | ICD-10-CM | POA: Diagnosis present

## 2019-08-31 DIAGNOSIS — F05 Delirium due to known physiological condition: Secondary | ICD-10-CM | POA: Diagnosis not present

## 2019-08-31 DIAGNOSIS — E876 Hypokalemia: Secondary | ICD-10-CM | POA: Diagnosis not present

## 2019-08-31 DIAGNOSIS — Z72820 Sleep deprivation: Secondary | ICD-10-CM

## 2019-08-31 DIAGNOSIS — R410 Disorientation, unspecified: Secondary | ICD-10-CM | POA: Diagnosis present

## 2019-08-31 DIAGNOSIS — L8990 Pressure ulcer of unspecified site, unspecified stage: Secondary | ICD-10-CM | POA: Insufficient documentation

## 2019-08-31 DIAGNOSIS — Z79899 Other long term (current) drug therapy: Secondary | ICD-10-CM

## 2019-08-31 DIAGNOSIS — I739 Peripheral vascular disease, unspecified: Secondary | ICD-10-CM | POA: Diagnosis present

## 2019-08-31 DIAGNOSIS — I10 Essential (primary) hypertension: Secondary | ICD-10-CM | POA: Diagnosis present

## 2019-08-31 DIAGNOSIS — E785 Hyperlipidemia, unspecified: Secondary | ICD-10-CM | POA: Diagnosis present

## 2019-08-31 DIAGNOSIS — D72829 Elevated white blood cell count, unspecified: Secondary | ICD-10-CM | POA: Diagnosis not present

## 2019-08-31 DIAGNOSIS — R739 Hyperglycemia, unspecified: Secondary | ICD-10-CM | POA: Diagnosis not present

## 2019-08-31 DIAGNOSIS — G441 Vascular headache, not elsewhere classified: Secondary | ICD-10-CM | POA: Diagnosis present

## 2019-08-31 DIAGNOSIS — R1310 Dysphagia, unspecified: Secondary | ICD-10-CM | POA: Diagnosis present

## 2019-08-31 DIAGNOSIS — I2602 Saddle embolus of pulmonary artery with acute cor pulmonale: Secondary | ICD-10-CM | POA: Diagnosis not present

## 2019-08-31 DIAGNOSIS — I2693 Single subsegmental pulmonary embolism without acute cor pulmonale: Secondary | ICD-10-CM | POA: Diagnosis not present

## 2019-08-31 DIAGNOSIS — L89152 Pressure ulcer of sacral region, stage 2: Secondary | ICD-10-CM | POA: Diagnosis present

## 2019-08-31 DIAGNOSIS — E041 Nontoxic single thyroid nodule: Secondary | ICD-10-CM | POA: Diagnosis present

## 2019-08-31 LAB — CBC
HCT: 41.8 % (ref 36.0–46.0)
Hemoglobin: 13.7 g/dL (ref 12.0–15.0)
MCH: 27.4 pg (ref 26.0–34.0)
MCHC: 32.8 g/dL (ref 30.0–36.0)
MCV: 83.6 fL (ref 80.0–100.0)
Platelets: 283 10*3/uL (ref 150–400)
RBC: 5 MIL/uL (ref 3.87–5.11)
RDW: 14.5 % (ref 11.5–15.5)
WBC: 21.5 10*3/uL — ABNORMAL HIGH (ref 4.0–10.5)
nRBC: 0 % (ref 0.0–0.2)

## 2019-08-31 LAB — BASIC METABOLIC PANEL
Anion gap: 8 (ref 5–15)
BUN: 10 mg/dL (ref 8–23)
CO2: 24 mmol/L (ref 22–32)
Calcium: 8.8 mg/dL — ABNORMAL LOW (ref 8.9–10.3)
Chloride: 103 mmol/L (ref 98–111)
Creatinine, Ser: 0.7 mg/dL (ref 0.44–1.00)
GFR calc Af Amer: >60 mL/min (ref >60)
GFR calc non Af Amer: >60 mL/min (ref >60)
Glucose, Bld: 199 mg/dL — ABNORMAL HIGH (ref 70–99)
Potassium: 3.4 mmol/L — ABNORMAL LOW (ref 3.5–5.1)
Sodium: 135 mmol/L (ref 135–145)

## 2019-08-31 LAB — FIBRINOGEN: Fibrinogen: 619 mg/dL — ABNORMAL HIGH (ref 210–475)

## 2019-08-31 LAB — APTT: aPTT: 32 seconds (ref 24–36)

## 2019-08-31 LAB — PROTIME-INR
INR: 1.1 (ref 0.8–1.2)
Prothrombin Time: 13.9 seconds (ref 11.4–15.2)

## 2019-08-31 MED ORDER — NIMODIPINE 6 MG/ML PO SOLN
60.0000 mg | ORAL | Status: DC
  Administered 2019-08-31 – 2019-09-02 (×13): 60 mg via ORAL
  Filled 2019-08-31 (×13): qty 10

## 2019-08-31 MED ORDER — STROKE: EARLY STAGES OF RECOVERY BOOK
Freq: Once | Status: AC
  Filled 2019-08-31: qty 1

## 2019-08-31 MED ORDER — HYDROMORPHONE HCL 1 MG/ML IJ SOLN
0.5000 mg | INTRAMUSCULAR | Status: DC | PRN
  Administered 2019-09-05 – 2019-09-07 (×3): 0.5 mg via INTRAVENOUS
  Filled 2019-08-31 (×4): qty 1

## 2019-08-31 MED ORDER — ASPIRIN 325 MG PO TABS
650.0000 mg | ORAL_TABLET | Freq: Once | ORAL | Status: AC
  Administered 2019-08-31: 650 mg via ORAL
  Filled 2019-08-31: qty 2

## 2019-08-31 MED ORDER — NIMODIPINE 30 MG PO CAPS
60.0000 mg | ORAL_CAPSULE | ORAL | Status: DC
  Filled 2019-08-31: qty 2

## 2019-08-31 MED ORDER — POLYETHYLENE GLYCOL 3350 17 G PO PACK
17.0000 g | PACK | Freq: Every day | ORAL | Status: DC
  Administered 2019-09-02: 17 g via ORAL
  Filled 2019-08-31: qty 1

## 2019-08-31 MED ORDER — CHLORHEXIDINE GLUCONATE CLOTH 2 % EX PADS
6.0000 | MEDICATED_PAD | Freq: Every day | CUTANEOUS | Status: DC
  Administered 2019-08-31 – 2019-09-10 (×10): 6 via TOPICAL

## 2019-08-31 MED ORDER — NIMODIPINE 30 MG PO CAPS: 60.0000 mg | ORAL_CAPSULE | ORAL | Status: DC

## 2019-08-31 MED ORDER — SODIUM CHLORIDE 0.9 % IV SOLN: INTRAVENOUS | Status: DC

## 2019-08-31 MED ORDER — LABETALOL HCL 5 MG/ML IV SOLN
20.0000 mg | Freq: Once | INTRAVENOUS | Status: AC
  Administered 2019-08-31: 20 mg via INTRAVENOUS
  Filled 2019-08-31: qty 4

## 2019-08-31 MED ORDER — ACETAMINOPHEN 325 MG PO TABS
650.0000 mg | ORAL_TABLET | Freq: Four times a day (QID) | ORAL | Status: DC | PRN
  Administered 2019-08-31: 650 mg via ORAL
  Filled 2019-08-31 (×2): qty 2

## 2019-08-31 MED ORDER — DEXTROSE 50 % IV SOLN: 12.5000 g | Freq: Once | INTRAVENOUS | Status: DC

## 2019-08-31 MED ORDER — ACETAMINOPHEN 650 MG RE SUPP: 650.0000 mg | Freq: Four times a day (QID) | RECTAL | Status: DC | PRN

## 2019-08-31 MED ORDER — CLEVIDIPINE BUTYRATE 0.5 MG/ML IV EMUL
0.0000 mg/h | INTRAVENOUS | Status: DC
  Administered 2019-08-31: 1 mg/h via INTRAVENOUS
  Filled 2019-08-31: qty 50

## 2019-08-31 MED ORDER — SODIUM CHLORIDE 0.9 % IV SOLN
2.0000 g | Freq: Three times a day (TID) | INTRAVENOUS | Status: DC
  Administered 2019-08-31 – 2019-09-02 (×7): 2 g via INTRAVENOUS
  Filled 2019-08-31 (×10): qty 2

## 2019-08-31 MED ORDER — AMLODIPINE BESYLATE 10 MG PO TABS
10.0000 mg | ORAL_TABLET | Freq: Every day | ORAL | Status: DC
  Administered 2019-08-31 – 2019-09-01 (×2): 10 mg via ORAL
  Filled 2019-08-31 (×2): qty 1

## 2019-08-31 MED ORDER — VANCOMYCIN HCL IN DEXTROSE 1-5 GM/200ML-% IV SOLN: 1000.0000 mg | Freq: Two times a day (BID) | INTRAVENOUS | Status: DC

## 2019-08-31 MED ORDER — BENAZEPRIL HCL 20 MG PO TABS
20.0000 mg | ORAL_TABLET | Freq: Every day | ORAL | Status: DC
  Administered 2019-08-31 – 2019-09-01 (×2): 20 mg via ORAL
  Filled 2019-08-31 (×3): qty 1

## 2019-08-31 MED ORDER — VANCOMYCIN HCL 1250 MG/250ML IV SOLN
1250.0000 mg | INTRAVENOUS | Status: DC
  Administered 2019-08-31: 1250 mg via INTRAVENOUS
  Filled 2019-08-31: qty 250

## 2019-08-31 MED ORDER — NIMODIPINE 6 MG/ML PO SOLN
60.0000 mg | ORAL | Status: DC
  Filled 2019-08-31: qty 10

## 2019-08-31 MED ORDER — CLOPIDOGREL BISULFATE 75 MG PO TABS
300.0000 mg | ORAL_TABLET | Freq: Once | ORAL | Status: AC
  Administered 2019-08-31: 300 mg via ORAL
  Filled 2019-08-31: qty 4

## 2019-08-31 MED ORDER — IOHEXOL 350 MG/ML SOLN
100.0000 mL | Freq: Once | INTRAVENOUS | Status: AC | PRN
  Administered 2019-08-31: 100 mL via INTRAVENOUS

## 2019-08-31 MED ORDER — NIMODIPINE 6 MG/ML PO SOLN: 60.0000 mg | ORAL | Status: DC

## 2019-08-31 NOTE — Evaluation (Signed)
Speech Language Pathology Evaluation Patient Details Name: Haley Mayo MRN: [id number] DOB: 07/17/1950 Today's Date: 08/31/2019 Time: [id number] SLP Time Calculation (min) (ACUTE ONLY): 21 min  Problem List:  Patient Active Problem List   Diagnosis Date Noted  . Subarachnoid hemorrhage ([place]) 08/30/2019  . CAP (community acquired pneumonia) 08/27/2019  . Community acquired pneumonia 08/26/2019  . Elevated CK 08/26/2019  . Generalized weakness 08/26/2019  . Healthcare maintenance 02/02/2011  . Smoking 02/02/2011  . 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 Hyperlipemia 01/27/2010  . LEUKOCYTOSIS 01/27/2010  . Essential hypertension, benign 10/04/2009   Past Medical History:  Past Medical History:  Diagnosis Date  . HLD (hyperlipidemia)   . HTN (hypertension)    controlled  . Leukocytosis    ?   Marland Kitchen Neoplasm of ear    left ear lobe  . Obesity   . Wears dentures    upper   Past Surgical History:  Past Surgical History:  Procedure Laterality Date  . ABDOMINAL HYSTERECTOMY  Age 76  . COLONOSCOPY  09/2004   normal, rpt 10 yrs  . EAR CYST EXCISION Left 09/27/2017   Procedure: EXCISION EAR LOBE CYST;  Surgeon: Beverly Gust, MD;  Location: [facility];  Service: ENT;  Laterality: Left;  LOCAL ONLY  . OOPHORECTOMY    . TOTAL VAGINAL HYSTERECTOMY  05/2001   heavy bleeding, fibroids   HPI:  Pt is 69 yo female that presented to ED for generalized weakness, pt stated she has been down on the floor at home since 5/7. Reported recent [place] vaccination (5/3). PMH of HLD, HTN. 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  Pt transferred to [facility] for neurosurgery consult.    Assessment / Plan / Recommendation Clinical Impression   Pt followed simple commands and participated in simple verbal tasks with good accuracy but with delayed processing and effort needed. She was drowsy with fluctuating response times, at times taking over a full minute to  respond. She is mildly disoriented to situation/time and confusion (she told me that it had been the 17th of May for several days now and said that she thought "today" meant "tomorrow.") She is easily distracted by internal and external factors but can be easily redirected. She will benefit from ongoing cognitive therapy given high level of independence PTA.    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)    Follow Up Recommendations  (tba)    Frequency and Duration min 2x/week  2 weeks      SLP Evaluation Cognition  Overall Cognitive Status: Impaired/Different from baseline Arousal/Alertness: (drowsy) Orientation Level: Oriented to person;Oriented to place;Disoriented to situation;Disoriented to time Attention: Selective Selective Attention: Impaired Selective Attention Impairment: Verbal basic Memory: Impaired Memory Impairment: Storage deficit Awareness: Impaired Awareness Impairment: Intellectual impairment Comments: delayed processing       Comprehension  Auditory Comprehension Overall Auditory Comprehension: Impaired Commands: Within Functional Limits(but with slow processing time) Conversation: Simple Interfering Components: Processing speed;Attention    Expression Expression Primary Mode of Expression: Verbal Verbal Expression Overall Verbal Expression: Appears within functional limits for tasks assessed   Oral / Motor  Motor Speech Overall Motor Speech: Appears within functional limits for tasks assessed   GO                     Osie Bond., M.A. [place] Pager [phone] Office [phone]  08/31/2019, 3:16  PM

## 2019-08-31 NOTE — Progress Notes (Signed)
PHARMACY NOTE:  ANTIMICROBIAL RENAL DOSAGE ADJUSTMENT  Current antimicrobial regimen includes a mismatch between antimicrobial dosage and estimated renal function.  As per policy approved by the Pharmacy & Therapeutics and Medical Executive Committees, the antimicrobial dosage will be adjusted accordingly.  Current antimicrobial dosage:  Vanc 1250mg  IV Q24H  Indication: PNA  Renal Function:  Estimated Creatinine Clearance: 61.1 mL/min (by C-G formula based on SCr of 0.7 mg/dL). []      On intermittent HD, scheduled: []      On CRRT    Antimicrobial dosage has been changed to:  vanc 1gm IV Q12H for tough 15-20 mcg/mL  Additional comments:  MRSA PCR negative, consider stopping vanc   Thank you for allowing pharmacy to be a part of this patient's care.  Carlisa Eble D. Mina Marble, PharmD, BCPS, [place] 08/31/2019, 1:07 PM

## 2019-08-31 NOTE — Progress Notes (Signed)
Occupational Therapy Note  Pt seen at beside for partial occlusion of Lt lens of glasses to reduce diplopia while allowing both eyes to work binocularly, and allowing for visual input into periphery.  Pt reports diplopia much better with glasses in place.  She was lethargic, but when aroused was able to follow commands with a delay.  Will continue to follow as medically appropriate.     08/31/19 0900  OT Visit Information  Last OT Received On 08/31/19  Assistance Needed +2  History of Present Illness Pt is 69 yo female that presented to ED for generalized weakness, pt stated she has been down on the floor at home since 5/7. Reported recent [place] vaccination (5/3). PMH of HLD, HTN. 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  Pt transferred to [facility] for neurosurgery consult.   Precautions  Precautions Fall  Pain Assessment  Pain Assessment Faces  Faces Pain Scale 0  Cognition  Arousal/Alertness Lethargic  Behavior During Therapy Flat affect  Area of Impairment Attention;Following commands;Problem solving;Awareness;Orientation  Orientation Level Person;Time;Situation (is oriented to [place], but not to hospital )  Current Attention Level Sustained;Focused  Following Commands Follows one step commands consistently;Follows one step commands inconsistently;Follows one step commands with increased time  Awareness Intellectual  Problem Solving Slow processing;Decreased initiation;Requires verbal cues;Requires tactile cues  General Comments Pt lethargic and demonstrates difficulty maintaining arousal.  she has periods where she is very conversant and follows commands well, to being very sluggish and difficult to engage   Upper Extremity Assessment  Upper Extremity Assessment Generalized weakness (grossly assessed )  Lower Extremity Assessment  Lower Extremity Assessment Defer to PT evaluation  ADL  Overall ADL's  Needs assistance/impaired  General ADL Comments Pt  currently requires assist with all aspects   Bed Mobility  General bed mobility comments not assessed   Vision- Assessment  Vision Assessment? Yes  Eye Alignment Impaired (comment)  Ocular Range of Motion Restricted on the right  Additional Comments Pt reports diplopia at all times when eyes open.  She demonstrates difficulty sustaing Rt gaze with what appears to be oscillopsia with far Rt gaze.   eyes dyscongugate.  Accurate assessment difficult due to decreased arousal/lethargy   Transfers  General transfer comment Not assessed - bedrest   Other Exercises  Other Exercises partical Nasal occlusion to Lt lens of glasses to reduce diplopia.  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 2  Help from another person taking care of personal grooming? 2  Help from another person toileting, which includes using toliet, bedpan, or urinal? 1  Help from another person bathing (including washing, rinsing, drying)? 1  Help from another person to put on and taking off regular upper body clothing? 1  Help from another person to put on and taking off regular  lower body clothing? 1  6 Click Score 8  OT Goal Progression  Progress towards OT goals Not progressing  toward goals - comment  OT Time Calculation  OT Start Time (ACUTE ONLY) 0907  OT Stop Time (ACUTE ONLY) 0934  OT Time Calculation (min) 27 min  OT General Charges  $OT Visit 1 Visit  OT Treatments  $Therapeutic Activity 23-37 mins  Nilsa Nutting., OTR/L Acute Rehabilitation Services Pager [phone] Office [phone]

## 2019-08-31 NOTE — Progress Notes (Signed)
Transcranial Doppler  Date POD PCO2 HCT BP  MCA ACA PCA OPHT SIPH VERT Basilar  5/17 GC     Right  Left                  12  25   32  55   -43  -29              Right  Left                                            Right  Left                                             Right  Left                                             Right  Left                                            Right  Left                                            Right  Left                                        MCA = Middle Cerebral Artery      OPHT = Opthalmic Artery     BASILAR = Basilar Artery   ACA = Anterior Cerebral Artery     SIPH = Carotid Siphon PCA = Posterior Cerebral Artery   VERT = Verterbral Artery                   Normal MCA = 62+\-12 ACA = 50+\-12 PCA = 42+\-23    Bilateral lower extremity venous duplex has been completed. Preliminary results can be found in CV Proc through chart review.   08/31/19 2:07 PM Carlos Levering RVT

## 2019-08-31 NOTE — H&P (Signed)
Haley Mayo is an 69 y.o. female.   Chief Complaint: Subarachnoid hemorrhage HPI: Patient with a past medical history significant for hypertension, hyperlipidemia, and smoking was admitted to [facility] after she was found down during a well check requested by her son. She was diagnosed with CAP and started on antibiotic therapy.  Following a complaint of double vision, a CT head was ordered and a subarachnoid hemorrhage was identified. 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 Patient was transferred to [facility], where she was admitted by Neurosurgery for further evaluation and treatment.   Past Medical History:  Diagnosis Date  . HLD (hyperlipidemia)   . HTN (hypertension)    controlled  . Leukocytosis    ?   Marland Kitchen Neoplasm of ear    left ear lobe  . Obesity   . Wears dentures    upper    Past Surgical History:  Procedure Laterality Date  . ABDOMINAL HYSTERECTOMY  Age 70  . COLONOSCOPY  09/2004   normal, rpt 10 yrs  . EAR CYST EXCISION Left 09/27/2017   Procedure: EXCISION EAR LOBE CYST;  Surgeon: Beverly Gust, MD;  Location: [place];  Service: ENT;  Laterality: Left;  LOCAL ONLY  . OOPHORECTOMY    . TOTAL VAGINAL HYSTERECTOMY  05/2001   heavy bleeding, fibroids    Family History  Problem Relation Age of Onset  . Ulcers Father   . Pneumonia Mother   . Diabetes Sister   . Stroke Brother   . Lupus Sister   . Stroke Sister        hemorrhage after fall  . Lupus Sister   . Rheum arthritis Sister        ?  . Diabetes Sister   . Other Sister        peritoneal dialysis   Social History:  reports that she has been smoking cigarettes. She has a 40.00 pack-year smoking history. She uses smokeless tobacco. She reports that she does not drink alcohol or use drugs.  Allergies: No Known Allergies  Medications Prior to Admission  Medication Sig Dispense Refill  . acetaminophen (TYLENOL) 325 MG tablet  Take 2 tablets (650 mg total) by mouth every 6 (six) hours as needed for mild pain, fever or headache.    Marland Kitchen amLODipine (NORVASC) 10 MG tablet Take 1 tablet (10 mg total) by mouth daily.    Marland Kitchen amLODipine-benazepril (LOTREL) 10-20 MG capsule Take 1 capsule by mouth every evening.   3  . benazepril (LOTENSIN) 20 MG tablet Take 1 tablet (20 mg total) by mouth daily.    Marland Kitchen ceFEPIme 2 g in sodium chloride 0.9 % 100 mL Inject 2 g into the vein every 8 (eight) hours.    . Coenzyme Q10-Omega 3 Fatty Acd (HEALTHY HEART) EMUL Take 1 capsule by mouth daily.      Marland Kitchen enoxaparin (LOVENOX) 40 MG/0.4ML injection Inject 0.4 mLs (40 mg total) into the skin daily. 0 mL   . Multiple Vitamins-Minerals (EMERGEN-C VITAMIN C PO) Take 1 tablet by mouth as directed.    Marland Kitchen niMODipine (NIMOTOP) 30 MG capsule Take 2 capsules (60 mg total) by mouth every 4 (four) hours.    . polyethylene glycol (MIRALAX / GLYCOLAX) 17 g packet Take 17 g by mouth daily. 14 each 0  . vancomycin (VANCOREADY) 1250 MG/250ML SOLN Inject 250 mLs (1,250 mg total) into the vein daily.  Results for orders placed or performed during the hospital encounter of 08/26/19 (from the past 48 hour(s))  Basic metabolic panel     Status: Abnormal   Collection Time: 08/29/19  4:39 AM  Result Value Ref Range   Sodium 139 135 - 145 mmol/L   Potassium 2.8 (L) 3.5 - 5.1 mmol/L   Chloride 106 98 - 111 mmol/L   CO2 24 22 - 32 mmol/L   Glucose, Bld 151 (H) 70 - 99 mg/dL    Comment: Glucose reference range applies only to samples taken after fasting for at least 8 hours.   BUN 9 8 - 23 mg/dL   Creatinine, Ser 0.49 0.44 - 1.00 mg/dL   Calcium 8.3 (L) 8.9 - 10.3 mg/dL   GFR calc non Af Amer >60 >60 mL/min   GFR calc Af Amer >60 >60 mL/min   Anion gap 9 5 - 15    Comment: Performed at [facility], [address] [place], [place] [address]  CK     Status: Abnormal   Collection Time: 08/29/19  4:39 AM  Result Value Ref Range   Total CK 366 (H) 38 - 234  U/L    Comment: Performed at [facility], [address] [place], [place] [address]  CBC     Status: Abnormal   Collection Time: 08/29/19  4:39 AM  Result Value Ref Range   WBC 18.2 (H) 4.0 - 10.5 K/uL   RBC 4.90 3.87 - 5.11 MIL/uL   Hemoglobin 13.8 12.0 - 15.0 g/dL   HCT 39.6 36.0 - 46.0 %   MCV 80.8 80.0 - 100.0 fL   MCH 28.2 26.0 - 34.0 pg   MCHC 34.8 30.0 - 36.0 g/dL   RDW 14.5 11.5 - 15.5 %   Platelets 251 150 - 400 K/uL   nRBC 0.0 0.0 - 0.2 %    Comment: Performed at [facility], [address] [place], [place] [address]  CMV IgM     Status: None   Collection Time: 08/29/19 12:49 PM  Result Value Ref Range   CMV IgM <30.0 0.0 - 29.9 AU/mL    Comment: (NOTE)                                Negative         <30.0                                Equivocal  30.0 - 34.9                                Positive         >34.9 A positive result is generally indicative of acute infection, reactivation or persistent IgM production. Performed At: [facility] [place], [place] [id number] Rush Farmer MD [id number]   Epstein-Barr virus VCA, IgM     Status: None   Collection Time: 08/29/19 12:49 PM  Result Value Ref Range   EBV VCA IgM <36.0 0.0 - 35.9 U/mL    Comment: (NOTE)                                 Negative        <36.0  Equivocal 36.0 - 43.9                                 Positive        >43.9 Performed At: [facility] [place], [place] [id number] Rush Farmer MD [id number]   Potassium     Status: None   Collection Time: 08/29/19  6:57 PM  Result Value Ref Range   Potassium 4.3 3.5 - 5.1 mmol/L    Comment: Performed at [facility], [address] [place], [place] [address]  Comprehensive metabolic panel     Status: Abnormal   Collection Time: 08/30/19  7:02 AM  Result Value Ref Range   Sodium 139 135 - 145 mmol/L   Potassium 3.2 (L) 3.5 - 5.1  mmol/L   Chloride 104 98 - 111 mmol/L   CO2 25 22 - 32 mmol/L   Glucose, Bld 147 (H) 70 - 99 mg/dL    Comment: Glucose reference range applies only to samples taken after fasting for at least 8 hours.   BUN 10 8 - 23 mg/dL   Creatinine, Ser 0.41 (L) 0.44 - 1.00 mg/dL   Calcium 8.5 (L) 8.9 - 10.3 mg/dL   Total Protein 7.1 6.5 - 8.1 g/dL   Albumin 3.1 (L) 3.5 - 5.0 g/dL   AST 21 15 - 41 U/L   ALT 39 0 - 44 U/L   Alkaline Phosphatase 68 38 - 126 U/L   Total Bilirubin 0.7 0.3 - 1.2 mg/dL   GFR calc non Af Amer >60 >60 mL/min   GFR calc Af Amer >60 >60 mL/min   Anion gap 10 5 - 15    Comment: Performed at [facility], [address] [place], [place] [address]  CBC     Status: Abnormal   Collection Time: 08/30/19  7:02 AM  Result Value Ref Range   WBC 20.4 (H) 4.0 - 10.5 K/uL   RBC 5.10 3.87 - 5.11 MIL/uL   Hemoglobin 13.9 12.0 - 15.0 g/dL   HCT 41.5 36.0 - 46.0 %   MCV 81.4 80.0 - 100.0 fL   MCH 27.3 26.0 - 34.0 pg   MCHC 33.5 30.0 - 36.0 g/dL   RDW 14.6 11.5 - 15.5 %   Platelets 266 150 - 400 K/uL   nRBC 0.0 0.0 - 0.2 %    Comment: Performed at [facility], [address], [place], [place] [address]  MRSA PCR Screening     Status: None   Collection Time: 08/30/19  4:07 PM   Specimen: Nasopharyngeal  Result Value Ref Range   MRSA by PCR NEGATIVE NEGATIVE    Comment:        The GeneXpert MRSA Assay (FDA approved for NASAL specimens only), is one component of a comprehensive MRSA colonization surveillance program. It is not intended to diagnose MRSA infection nor to guide or monitor treatment for MRSA infections. Performed at [facility], [address] [place],  [address]    CT ANGIO HEAD W OR WO CONTRAST  Addendum Date: 08/31/2019   ADDENDUM REPORT: 08/31/2019 00:58 ADDENDUM: 2.3 cm right thyroid nodule. In the setting of significant comorbidities or limited life expectancy, no follow-up recommended (ref: J Am Coll Radiol. 2015  Feb;12(2): 143-50). Electronically Signed   By: Ulyses Jarred M.D.   On: 08/31/2019 00:58   Result Date: 08/31/2019 CLINICAL DATA:  Intracranial hemorrhage EXAM: CT  ANGIOGRAPHY HEAD AND NECK TECHNIQUE: Multidetector CT imaging of the head and neck was performed using the standard protocol during bolus administration of intravenous contrast. Multiplanar CT image reconstructions and MIPs were obtained to evaluate the vascular anatomy. Carotid stenosis measurements (when applicable) are obtained utilizing NASCET criteria, using the distal internal carotid diameter as the denominator. CONTRAST:  57mL OMNIPAQUE IOHEXOL 350 MG/ML SOLN COMPARISON:  None. FINDINGS: CT HEAD FINDINGS Brain: Unchanged distribution of subarachnoid hemorrhage. No midline shift or mass effect. Skull: The visualized skull base, calvarium and extracranial soft tissues are normal. Sinuses/Orbits: No fluid levels or advanced mucosal thickening of the visualized paranasal sinuses. No mastoid or middle ear effusion. The orbits are normal. CTA NECK FINDINGS SKELETON: There is no bony spinal canal stenosis. No lytic or blastic lesion. OTHER NECK: 2.3 cm right thyroid nodule. UPPER CHEST: There is an embolus within the right upper lobar pulmonary artery. AORTIC ARCH: There is mild calcific atherosclerosis of the aortic arch. There is no aneurysm, dissection or hemodynamically significant stenosis of the visualized portion of the aorta. Conventional 3 vessel aortic branching pattern. The visualized proximal subclavian arteries are widely patent. RIGHT CAROTID SYSTEM: Normal without aneurysm, dissection or stenosis. LEFT CAROTID SYSTEM: Normal without aneurysm, dissection or stenosis. VERTEBRAL ARTERIES: Codominant configuration. Both origins are clearly patent. There is no dissection, occlusion or flow-limiting stenosis to the skull base (V1-V3 segments). CTA HEAD FINDINGS POSTERIOR CIRCULATION: --Vertebral arteries: Small rate narrowing of the proximal  right V4 segments --Inferior cerebellar arteries: Normal. 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 --Superior cerebellar arteries: Normal. --Posterior cerebral arteries (PCA): Normal. ANTERIOR CIRCULATION: --Intracranial internal carotid arteries: Normal. --Anterior cerebral arteries (ACA): Normal. Both A1 segments are present. Patent anterior communicating artery (a-comm). --Middle cerebral arteries (MCA): Normal. VENOUS SINUSES: As permitted by contrast timing, patent. ANATOMIC VARIANTS: None Review of the MIP images confirms the above findings. IMPRESSION: 1. Unchanged distribution of subarachnoid hemorrhage. 2. 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 3. Right upper lobar pulmonary artery embolus, incompletely visualized. 4. Aortic Atherosclerosis (ICD10-I70.0). Critical Value/emergent results were called by telephone at the time of interpretation on 08/30/2019 at 10:40 pm to provider Rufina Falco , who verbally acknowledged these results. Electronically Signed: By: Ulyses Jarred M.D. On: 08/30/2019 22:41   CT HEAD WO CONTRAST  Result Date: 08/30/2019 CLINICAL DATA:  Diplopia EXAM: CT HEAD WITHOUT CONTRAST TECHNIQUE: Contiguous axial images were obtained from the base of the skull through the vertex without intravenous contrast. COMPARISON:  None. FINDINGS: Brain: There is acute subarachnoid hemorrhage over both cerebral hemispheres. Small amount of blood within the occipital horns of the lateral ventricles. There is a small amount of subarachnoid blood within the cisterna magna. There is subdural blood along the tentorium cerebelli. No midline shift or other mass effect. No intraparenchymal hemorrhage. 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 This measures approximately 5 mm. Skull: The visualized skull base, calvarium and extracranial soft tissues are normal. Sinuses/Orbits: No fluid levels or advanced mucosal thickening  of the visualized paranasal sinuses. No mastoid or middle ear effusion. The orbits are normal. IMPRESSION: Aneurysmal pattern subarachnoid hemorrhage over both convexities and extending into the ventricles and basal cisterns. 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 CTA of the head recommended for better characterization. Critical Value/emergent results were called by telephone at the time of interpretation on 08/30/2019 at 8:28  pm to provider Rufina Falco , who verbally acknowledged these results. Electronically Signed   By: Ulyses Jarred M.D.   On: 08/30/2019 20:28   CT ANGIO NECK W OR WO CONTRAST  Addendum Date: 08/31/2019   ADDENDUM REPORT: 08/31/2019 00:58 ADDENDUM: 2.3 cm right thyroid nodule. In the setting of significant comorbidities or limited life expectancy, no follow-up recommended (ref: J Am Coll Radiol. 2015 Feb;12(2): 143-50). Electronically Signed   By: Ulyses Jarred M.D.   On: 08/31/2019 00:58   Result Date: 08/31/2019 CLINICAL DATA:  Intracranial hemorrhage EXAM: CT ANGIOGRAPHY HEAD AND NECK TECHNIQUE: Multidetector CT imaging of the head and neck was performed using the standard protocol during bolus administration of intravenous contrast. Multiplanar CT image reconstructions and MIPs were obtained to evaluate the vascular anatomy. Carotid stenosis measurements (when applicable) are obtained utilizing NASCET criteria, using the distal internal carotid diameter as the denominator. CONTRAST:  90mL OMNIPAQUE IOHEXOL 350 MG/ML SOLN COMPARISON:  None. FINDINGS: CT HEAD FINDINGS Brain: Unchanged distribution of subarachnoid hemorrhage. No midline shift or mass effect. Skull: The visualized skull base, calvarium and extracranial soft tissues are normal. Sinuses/Orbits: No fluid levels or advanced mucosal thickening of the visualized paranasal sinuses. No mastoid or middle ear effusion. The orbits are normal. CTA NECK FINDINGS SKELETON: There is no bony spinal canal stenosis. No  lytic or blastic lesion. OTHER NECK: 2.3 cm right thyroid nodule. UPPER CHEST: There is an embolus within the right upper lobar pulmonary artery. AORTIC ARCH: There is mild calcific atherosclerosis of the aortic arch. There is no aneurysm, dissection or hemodynamically significant stenosis of the visualized portion of the aorta. Conventional 3 vessel aortic branching pattern. The visualized proximal subclavian arteries are widely patent. RIGHT CAROTID SYSTEM: Normal without aneurysm, dissection or stenosis. LEFT CAROTID SYSTEM: Normal without aneurysm, dissection or stenosis. VERTEBRAL ARTERIES: Codominant configuration. Both origins are clearly patent. There is no dissection, occlusion or flow-limiting stenosis to the skull base (V1-V3 segments). CTA HEAD FINDINGS POSTERIOR CIRCULATION: --Vertebral arteries: Small rate narrowing of the proximal right V4 segments --Inferior cerebellar arteries: Normal. 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 --Superior cerebellar arteries: Normal. --Posterior cerebral arteries (PCA): Normal. ANTERIOR CIRCULATION: --Intracranial internal carotid arteries: Normal. --Anterior cerebral arteries (ACA): Normal. Both A1 segments are present. Patent anterior communicating artery (a-comm). --Middle cerebral arteries (MCA): Normal. VENOUS SINUSES: As permitted by contrast timing, patent. ANATOMIC VARIANTS: None Review of the MIP images confirms the above findings. IMPRESSION: 1. Unchanged distribution of subarachnoid hemorrhage. 2. 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 3. Right upper lobar pulmonary artery embolus, incompletely visualized. 4. Aortic Atherosclerosis (ICD10-I70.0). Critical Value/emergent results were called by telephone at the time of interpretation on 08/30/2019 at 10:40 pm to provider Rufina Falco , who verbally acknowledged these results. Electronically Signed: By: Ulyses Jarred M.D. On: 08/30/2019 22:41    ECHOCARDIOGRAM COMPLETE  Result Date: 08/29/2019    ECHOCARDIOGRAM REPORT   Patient Name:   DALI ESTESS Date of Exam: 08/29/2019 Medical Rec #:  [id number]           Height:       60.0 in Accession #:    [id number]          Weight:       162.0 lb Date of Birth:  1950-05-03          BSA:  1.707 m Patient Age:    82 years            BP:           130/64 mmHg Patient Gender: F                   HR:           75 bpm. Exam Location:  [facility] Procedure: 2D Echo                                 MODIFIED REPORT:    This report was modified by Yolonda Kida MD on 08/29/2019 due to Mild                              diastolic dysfunction.  Indications:     Cardiomegaly 429.3/ I51.7  History:         Patient has no prior history of Echocardiogram examinations.  Sonographer:     Arville Go RDCS Referring Phys:  [id number] [place] Diagnosing Phys: Yolonda Kida MD  Sonographer Comments: Technically challenging study due to limited acoustic windows, Technically difficult study due to poor echo windows and suboptimal parasternal window. Image acquisition challenging due to patient body habitus. IMPRESSIONS  1. Left ventricular ejection fraction, by estimation, is 55 to 60%. The left ventricle has normal function. The left ventricle has no regional wall motion abnormalities. Left ventricular diastolic parameters are consistent with Grade I diastolic dysfunction (impaired relaxation).  2. Right ventricular systolic function is normal. The right ventricular size is normal.  3. The mitral valve is normal in structure. No evidence of mitral valve regurgitation.  4. The aortic valve is normal in structure. Aortic valve regurgitation is not visualized. Mild aortic valve sclerosis is present, with no evidence of aortic valve stenosis. FINDINGS  Left Ventricle: Left ventricular ejection fraction, by estimation, is 55 to 60%. The left ventricle has normal function. The left ventricle has no regional wall motion  abnormalities. The left ventricular internal cavity size was normal in size. There is  no left ventricular hypertrophy. Left ventricular diastolic parameters are consistent with Grade I diastolic dysfunction (impaired relaxation). Right Ventricle: The right ventricular size is normal. No increase in right ventricular wall thickness. Right ventricular systolic function is normal. Left Atrium: Left atrial size was normal in size. Right Atrium: Right atrial size was normal in size. Pericardium: The pericardium was not assessed. Mitral Valve: The mitral valve is normal in structure. No evidence of mitral valve regurgitation. Tricuspid Valve: The tricuspid valve is normal in structure. Tricuspid valve regurgitation is mild. Aortic Valve: The aortic valve is normal in structure. Aortic valve regurgitation is not visualized. Mild aortic valve sclerosis is present, with no evidence of aortic valve stenosis. Aortic valve peak gradient measures 15.7 mmHg. Pulmonic Valve: The pulmonic valve was normal in structure. Pulmonic valve regurgitation is not visualized. Aorta: The aortic arch was not well visualized. IAS/Shunts: No atrial level shunt detected by color flow Doppler.  LEFT VENTRICLE PLAX 2D LVIDd:         4.05 cm  Diastology LVIDs:         2.90 cm  LV e' lateral:   5.33 cm/s LV PW:         1.45 cm  LV E/e' lateral: 13.3 LV IVS:        1.47 cm  LV e' medial:    4.13 cm/s LVOT diam:     2.10 cm  LV E/e' medial:  17.1 LV SV:         62 LV SV Index:   36 LVOT Area:     3.46 cm  RIGHT VENTRICLE RV Basal diam:  2.63 cm RV S prime:     14.50 cm/s LEFT ATRIUM           Index       RIGHT ATRIUM           Index LA diam:      2.90 cm 1.70 cm/m  RA Area:     12.80 cm LA Vol (A2C): 23.2 ml 13.59 ml/m RA Volume:   27.90 ml  16.35 ml/m LA Vol (A4C): 24.9 ml 14.59 ml/m  AORTIC VALVE                PULMONIC VALVE AV Area (Vmax): 1.61 cm    PV Vmax:       1.17 m/s AV Vmax:        198.00 cm/s PV Peak grad:  5.5 mmHg AV Peak Grad:    15.7 mmHg LVOT Vmax:      92.00 cm/s LVOT Vmean:     55.700 cm/s LVOT VTI:       0.178 m  AORTA Ao Root diam: 2.70 cm Ao Asc diam:  3.10 cm MITRAL VALVE MV Area (PHT): 3.85 cm    SHUNTS MV Decel Time: 197 msec    Systemic VTI:  0.18 m MV E velocity: 70.70 cm/s  Systemic Diam: 2.10 cm MV A velocity: 81.80 cm/s MV E/A ratio:  0.86 Dwayne D Callwood MD Electronically signed by Yolonda Kida MD Signature Date/Time: 08/29/2019/4:14:02 PM    Final (Updated)     Review of Systems  Constitutional: Positive for activity change and fever.  HENT: Negative.   Eyes: Negative.   Respiratory: Positive for cough. Negative for chest tightness, shortness of breath and wheezing.   Cardiovascular: Negative for chest pain, palpitations and leg swelling.  Gastrointestinal: Negative for constipation, diarrhea, nausea and vomiting.  Endocrine: Negative.   Genitourinary: Negative.   Musculoskeletal: Negative.   Skin: Negative.   Allergic/Immunologic: Negative.   Neurological: Positive for weakness, light-headedness and headaches. Negative for facial asymmetry, speech difficulty and numbness.       Blurred vision  Psychiatric/Behavioral: Negative.     Blood pressure (!) 147/71, pulse 80, temperature (!) 100.7 F (38.2 C), temperature source Axillary, resp. rate 16, SpO2 96 %. Physical Exam  Constitutional: She is oriented to person, place, and time. She appears well-developed and well-nourished.  HENT:  Head: Normocephalic and atraumatic.  Eyes: Pupils are equal, round, and reactive to light. Conjunctivae and EOM are normal.  Cardiovascular: Normal rate and regular rhythm.  Respiratory: Effort normal. No respiratory distress.  GI: Soft. She exhibits no distension. There is no abdominal tenderness.  Musculoskeletal:        General: Normal range of motion.     Cervical back: Normal range of motion and neck supple.  Neurological: She is alert and oriented to person, place, and time. She has normal strength.  No cranial nerve deficit or sensory deficit. GCS eye subscore is 4. GCS verbal subscore is 5. GCS motor subscore is 6.  Skin: Skin is warm and dry.  Psychiatric: She has a normal mood and affect. Her behavior is normal. Judgment and thought content normal.     Assessment/Plan Patient with aneurysmal  pattern subarachnoid hemorrhage over both convexities and extending into the ventricles and basal cisterns. 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 A right upper lobar pulmonary artery embolus was incompletely visualized as well.  -CTA chest ordered to better visualize pulmonary artery embolus. Patient is unable to be anticoagulated at this time due to [facility]. -Venous dopplers of bilateral lower extremities ordered to rule out DVTs. [place] filter will be needed if exam is positive. -Dr. Kathyrn Sheriff of Neurosurgery will evaluate patient later today for possible coiling of basilar artery aneurysm. -Nimotop ordered for vasospasm prophylaxis -Patient NPO except for Nimotop -SBP goal less than 140  Patricia Nettle, NP 08/31/2019, 1:40 AM

## 2019-08-31 NOTE — Progress Notes (Addendum)
    BRIEF OVERNIGHT PROGRESS REPORT   SUBJECTIVE: Radiologist called to report abnormal CT head showing SAH and 31mm Basilar tip aneurysm.  OBJECTIVE:On arrival to the bedside, she was afebrile with blood pressure 224/114 mm Hg and pulse rate 81 beats/min. There were no focal neurological deficits; she was alert and oriented x4, and c/o headache 9/10, double vision and lethargy. Exam positive for binocular diplopia  BRIEF PATIENT DESCRIPTION:  69 year old female with PMH of HTN admitted 08/26/2019 with CAP, mild Rhabdomyolysis due to prolonged downtime, HTN, Elevated LFTs and generalized weakness.   SIGNIFICANT EVENTS  5/12: Presents to the ED after police were called by son for well check. Police found pt on the floor. Pt reports that she had been on the floor for 8 days after she got really weak from receiving Moderna COVID vaccine 2 days prior. 5/12: Admission to [place] unit 5/16: Patient c/o double vision, headache and generalized weakness which she stated has been ongoing since admission 5/16: CT Head ordered 5/16: Patient transferred to [facility] under Neurosurgery service  STUDIES:  5/12: CXR>>Portable chest x-ray shows mild bibasilar atelectasis and/or infiltrate without evidence of pleural effusion or pneumothorax. 5/12: RUQ Ultrasound>>shows changes compatible hepatic steatosis, small simple appearing hepatic cysts.  CBD diameter is 2.8 mm, no gallstones or wall thickening of the gallbladder noted. 5/15: Echocardiogram shows no right heart strain. 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 Right upper lobar pulmonary artery embolus, incompletely visualized.  CULTURES: MRSA-PCR - Negative Blood cultures - pending SARS-CoV-2 PCR 5/12>> negative  ANTIBIOTICS: Azithromycin 1st dose 5/12 Ceftriaxone 1st dose  5/12  ASSESSMENT/PLAN:  1. 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 Annette Stable who recommends transfer to [facility] Neuro- ICU - BP management to keep SBP<140 - Neuro checks Q2 hours x12 hours, then 14 hours - Start Nimodipine for vasospasm prevention - Hold anticoagulation - Seizure precaution - Management per ICU tean  2. Right Pulmonary Embolism - This was visualized on CTA head and neck as above - Extremity DVT US /CT Angiogram PE - Anticoagulation contraindicated in the setting of [place] - Echo on 5/15 without evidence of right heart strain - Management per ICU team  3. CAP - Continue empirix abx treatment - Supplemental O2 as needed to maintain O2 saturations 88 to 92% - As needed bronchodilators - Trend CBC  - Monitor fever curve  4. HTN - Hypertensive on presentation with SBP> 200 + Goal BP <140/90 - Continue BP meds    Rufina Falco, DNP, CCRN, [facility] Nurse Practitioner Between 7pm to [place] - Pager - [phone]   [place] [facility]  Office  [phone]

## 2019-08-31 NOTE — Progress Notes (Signed)
  NEUROSURGERY PROGRESS NOTE   No issues overnight. Pts history reviewed in EMR. Presented to [facility] after being found down. She was initially treated for CAP but c/o double vision prompted head CT demonstrating SAH and CTA demonstrated basilar apex aneurysm. She was therefore transferred to [facility]. She does cont to c/o HA and blurry vision.  EXAM:  BP (!) 118/48   Pulse 78   Temp 99.2 F (37.3 C) (Oral)   Resp (!) 21   SpO2 97%   Awake, alert, oriented  Speech fluent, sometimes appears confused CN grossly intact  5/5 BUE/BLE, mild right pronator drift  IMAGING: CT demonstrates primarily prepontine/perimedullary SAH and tentorial SAH with minimal to no blood in the interpeduncular or ambient cistern. 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  IMPRESSION:  69 y.o. 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  PLAN: - Will load with ASA/Plavix and plan on angiogram possible stent- - Cont supportive care.  I have personally reviewed the CTA and reviewed the case with my colleagues. It seems clear she should be treated for her aneurysm given the presence of SAH. The only viable treatment option would be stent-supported coiling as I think the morbidity associated with clip reconstruction of the basilar apex is quite high, as is the risk of brainstem stroke with flow diversion in this location. I have reviewed this with the patient and we have tried to contact her son in [place] Agricultural consultant) but have not been able to talk to him yet. She tells [place] she does not have a relationship with her daughter in [place].

## 2019-08-31 NOTE — Progress Notes (Deleted)
    BRIEF OVERNIGHT PROGRESS REPORT   SUBJECTIVE: Radiologist called to report abnormal CT head showing   OBJECTIVE:On arrival to the bedside, she was afebrile with blood pressure 224/114 mm Hg and pulse rate 81 beats/min. There were no focal neurological deficits; she was alert and oriented x4, and c/o headache 9/10, double vision and lethargy.  BRIEF PATIENT DESCRIPTION:  69 year old female with PMH of HTN admitted 08/26/2019 with CAP, mild Rhabdomyolysis due to prolonged downtime, HTN, Elevated LFTs and generalized weakness.   SIGNIFICANT EVENTS  5/12: Presents to the ED after police were called by son for well check. Police found pt on the floor. Pt reports that she had been on the floor for 8 days after she got really weak from receiving Moderna COVID vaccine 2 days prior. 5/12: Admission to [place] unit 5/16: Patient c/o double vision, headache and generalized weakness which she stated has been ongoing since admission 5/16: CT Head ordered 5/16: Patient transferred to [place] under Neurosurgery service  STUDIES:  5/12: CXR>>Portable chest x-ray shows mild bibasilar atelectasis and/or infiltrate without evidence of pleural effusion or pneumothorax. 5/12: RUQ Ultrasound>>shows changes compatible hepatic steatosis, small simple appearing hepatic cysts.  CBD diameter is 2.8 mm, no gallstones or wall thickening of the gallbladder noted. 5/15: Echocardiogram shows no right heart strain. 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 Right upper lobar pulmonary artery embolus, incompletely visualized.  CULTURES: SARS-CoV-2 PCR 5/12>> negative  ANTIBIOTICS: Azithromycin 1st dose 5/12 Ceftriaxone 1st dose 5/12  ASSESSMENT/PLAN:  1. 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 Annette Stable who recommends transfer to [facility] Neuro- ICU - BP management to keep SBP<140 - Neuro checks Q2 hours x12 hours, then 14 hours - Hold anticoagulation - Seizure precaution - Management per ICU tean  2. Right Pulmonary Embolism - This was visualized on CTA head and neck as above - Extremity DVT US /CT Angiogram PE - Anticoagulation contraindicated in the setting of [place] - Echo on 5/15 without evidence of right heart strain - Management per ICU team  3. CAP - Continue empirix abx treatment - Supplemental O2 as needed to maintain O2 saturations 88 to 92% - As needed bronchodilators - Trend CBC  - Monitor fever curve  4. HTN - Hypertensive on presentation with SBP> 200 + Goal BP <140/90 - Continue BP meds    Rufina Falco, DNP, CCRN, [facility] Nurse Practitioner Between 7pm to [place] - Pager - [phone]   [place] [facility]  Office  [phone]

## 2019-08-31 NOTE — Progress Notes (Signed)
PT Cancellation Note  Patient Details Name: Haley Mayo MRN: [id number] DOB: 12-02-1950   Cancelled Treatment:    Reason Eval/Treat Not Completed: Patient not medically ready. Pt with active R PE however can't be anti-coagulated due to Haley Mayo. RN also speaking of possible coiling. Acute PT to return as able, as appropriate.  Haley Mayo, PT, DPT Acute Rehabilitation Services Pager #: [phone] Office #: [phone]    Haley Mayo 08/31/2019, 8:14 AM

## 2019-08-31 NOTE — Progress Notes (Signed)
I was able to contact the patient's son Kristopher by phone [phone]). We reviewed the findings thus far the plan to proceed with angiogram, possible stent-coiling. I reviewed the risks of the procedure with him including risk of stroke or hemorrhage leading to weakness/paralysis/coma/death. We discussed the rationale for treatment given the risk of re-hemorrhage. All his questions were answered and he provided consent to proceed.

## 2019-09-01 ENCOUNTER — Encounter (HOSPITAL_COMMUNITY)
Admission: AD | Disposition: A | Discharge status: Rehabilitation Facility | Payer: Self-pay | Source: Other Acute Inpatient Hospital | Attending: Neurosurgery

## 2019-09-01 ENCOUNTER — Inpatient Hospital Stay (HOSPITAL_COMMUNITY): Payer: Medicare Other

## 2019-09-01 ENCOUNTER — Inpatient Hospital Stay (HOSPITAL_COMMUNITY): Payer: Medicare Other | Admitting: Certified Registered Nurse Anesthetist

## 2019-09-01 ENCOUNTER — Other Ambulatory Visit (HOSPITAL_COMMUNITY): Payer: Medicare Other

## 2019-09-01 DIAGNOSIS — I609 Nontraumatic subarachnoid hemorrhage, unspecified: Secondary | ICD-10-CM

## 2019-09-01 DIAGNOSIS — I2693 Single subsegmental pulmonary embolism without acute cor pulmonale: Secondary | ICD-10-CM

## 2019-09-01 HISTORY — PX: RADIOLOGY WITH ANESTHESIA: SHX6223

## 2019-09-01 HISTORY — PX: IR ANGIO VERTEBRAL SEL VERTEBRAL UNI L MOD SED: IMG5367

## 2019-09-01 HISTORY — PX: IR NEURO EACH ADD'L AFTER BASIC UNI LEFT (MS): IMG5373

## 2019-09-01 HISTORY — PX: IR ANGIOGRAM FOLLOW UP STUDY: IMG697

## 2019-09-01 HISTORY — PX: IR TRANSCATH/EMBOLIZ: IMG695

## 2019-09-01 LAB — TYPE AND SCREEN
ABO/RH(D): A POS
Antibody Screen: NEGATIVE

## 2019-09-01 LAB — ABO/RH: ABO/RH(D): A POS

## 2019-09-01 SURGERY — IR WITH ANESTHESIA
Anesthesia: General

## 2019-09-01 MED ORDER — HYDRALAZINE HCL 20 MG/ML IJ SOLN
5.0000 mg | INTRAMUSCULAR | Status: DC | PRN
  Administered 2019-09-01 – 2019-09-02 (×2): 20 mg via INTRAVENOUS
  Filled 2019-09-01 (×2): qty 1

## 2019-09-01 MED ORDER — DEXAMETHASONE SODIUM PHOSPHATE 10 MG/ML IJ SOLN
INTRAMUSCULAR | Status: DC | PRN
  Administered 2019-09-01: 10 mg via INTRAVENOUS

## 2019-09-01 MED ORDER — LABETALOL HCL 5 MG/ML IV SOLN
5.0000 mg | INTRAVENOUS | Status: DC | PRN
  Administered 2019-09-02: 20 mg via INTRAVENOUS
  Administered 2019-09-04 – 2019-09-06 (×2): 5 mg via INTRAVENOUS
  Filled 2019-09-01 (×3): qty 4

## 2019-09-01 MED ORDER — LACTATED RINGERS IV SOLN: INTRAVENOUS | Status: DC | PRN

## 2019-09-01 MED ORDER — ONDANSETRON HCL 4 MG/2ML IJ SOLN
INTRAMUSCULAR | Status: DC | PRN
  Administered 2019-09-01: 4 mg via INTRAVENOUS

## 2019-09-01 MED ORDER — FENTANYL CITRATE (PF) 100 MCG/2ML IJ SOLN
INTRAMUSCULAR | Status: AC
  Filled 2019-09-01: qty 2

## 2019-09-01 MED ORDER — HEPARIN SODIUM (PORCINE) 1000 UNIT/ML IJ SOLN
INTRAMUSCULAR | Status: DC | PRN
Start: 2019-09-01 — End: 2019-09-01
  Administered 2019-09-01: 1000 [IU] via INTRAVENOUS
  Administered 2019-09-01: 5000 [IU] via INTRAVENOUS

## 2019-09-01 MED ORDER — PROPOFOL 10 MG/ML IV BOLUS
INTRAVENOUS | Status: DC | PRN
  Administered 2019-09-01: 150 mg via INTRAVENOUS
  Administered 2019-09-01: 50 mg via INTRAVENOUS

## 2019-09-01 MED ORDER — FENTANYL CITRATE (PF) 100 MCG/2ML IJ SOLN
INTRAMUSCULAR | Status: DC | PRN
  Administered 2019-09-01: 100 ug via INTRAVENOUS

## 2019-09-01 MED ORDER — SUGAMMADEX SODIUM 200 MG/2ML IV SOLN
INTRAVENOUS | Status: DC | PRN
  Administered 2019-09-01: 100 mg via INTRAVENOUS
  Administered 2019-09-01: 200 mg via INTRAVENOUS

## 2019-09-01 MED ORDER — CLOPIDOGREL BISULFATE 75 MG PO TABS
75.0000 mg | ORAL_TABLET | Freq: Every day | ORAL | Status: DC
  Administered 2019-09-01 – 2019-09-02 (×2): 75 mg via ORAL
  Filled 2019-09-01 (×2): qty 1

## 2019-09-01 MED ORDER — ROCURONIUM BROMIDE 10 MG/ML (PF) SYRINGE
PREFILLED_SYRINGE | INTRAVENOUS | Status: DC | PRN
  Administered 2019-09-01 (×2): 50 mg via INTRAVENOUS

## 2019-09-01 MED ORDER — LIDOCAINE 2% (20 MG/ML) 5 ML SYRINGE
INTRAMUSCULAR | Status: DC | PRN
  Administered 2019-09-01: 60 mg via INTRAVENOUS

## 2019-09-01 MED ORDER — ASPIRIN 325 MG PO TABS
325.0000 mg | ORAL_TABLET | Freq: Every day | ORAL | Status: DC
  Administered 2019-09-01 – 2019-09-02 (×2): 325 mg via ORAL
  Filled 2019-09-01 (×2): qty 1

## 2019-09-01 MED ORDER — IOHEXOL 300 MG/ML  SOLN
150.0000 mL | Freq: Once | INTRAMUSCULAR | Status: AC | PRN
  Administered 2019-09-01: 80 mL via INTRA_ARTERIAL

## 2019-09-01 MED ORDER — PHENYLEPHRINE HCL-NACL 10-0.9 MG/250ML-% IV SOLN
INTRAVENOUS | Status: DC | PRN
Start: 2019-09-01 — End: 2019-09-01
  Administered 2019-09-01: 20 ug/min via INTRAVENOUS

## 2019-09-01 NOTE — Progress Notes (Signed)
OT Cancellation Note  Patient Details Name: Haley Mayo MRN: [id number] DOB: 08/07/1950   Cancelled Treatment:    Reason Eval/Treat Not Completed: Medical issues which prohibited therapy(Scheduled for coiling today)  Ramond Dial, OT/L   Acute OT Clinical Specialist [place] Pager [phone] Office [phone]  09/01/2019, 8:54 AM

## 2019-09-01 NOTE — Progress Notes (Signed)
Patient arrives to Short Stay from 4N. Bedside report given. Patient alert and oriented x 4. NSR on monitor. SpO2 96% on 2 lpm. Anesthesia (CRNA) at beside.

## 2019-09-01 NOTE — Anesthesia Procedure Notes (Signed)
Arterial Line Insertion Start/End5/18/2021 1:50 PM, 09/01/2019 2:00 PM Performed by: Lance Coon, CRNA, CRNA  Preanesthetic checklist: patient identified, IV checked, site marked, risks and benefits discussed, surgical consent, monitors and equipment checked, pre-op evaluation, timeout performed and anesthesia consent Lidocaine 1% used for infiltration Left, radial was placed Catheter size: 20 G Hand hygiene performed , maximum sterile barriers used  and Seldinger technique used  Attempts: 1 Procedure performed without using ultrasound guided technique. Following insertion, dressing applied and Biopatch. Post procedure assessment: normal and unchanged  Patient tolerated the procedure well with no immediate complications.

## 2019-09-01 NOTE — Progress Notes (Signed)
  NEUROSURGERY PROGRESS NOTE   No issues overnight. Complains of mostly LBP. HA manageable No new N/T/W  EXAM:  BP 125/69 Comment: Simultaneous filing. User may not have seen previous data.  Pulse 67 Comment: Simultaneous filing. User may not have seen previous data.  Temp 97.7 F (36.5 C) (Oral)   Resp 16 Comment: Simultaneous filing. User may not have seen previous data.  SpO2 98% Comment: Simultaneous filing. User may not have seen previous data.  Awake, alert, oriented  Speech slow but appropriate  CN grossly intact  5/5 BUE/BLE, right drift  IMPRESSION/PLAN 69 y.o. 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 Neurologically intact. Also with PE - Angio with possible stent scheduled for today - Continue ASA/Plavix  - PE: cannot heparinize with SAH. Monitor, O2 as needed. Will consult PCCM.

## 2019-09-01 NOTE — Anesthesia Preprocedure Evaluation (Addendum)
Anesthesia Evaluation  Patient identified by MRN, date of birth, ID band Patient awake    Reviewed: Allergy & Precautions, H&P , NPO status , Patient's Chart, lab work & pertinent test results  Airway Mallampati: III  TM Distance: >3 FB Neck ROM: Full    Dental no notable dental hx. (+) Edentulous Upper, Dental Advisory Given   Pulmonary Current Smoker and Patient abstained from smoking.,    Pulmonary exam normal breath sounds clear to auscultation       Cardiovascular hypertension, Pt. on medications negative cardio ROS   Rhythm:Regular Rate:Normal     Neuro/Psych SAH Oriented x 3 negative neurological ROS  negative psych ROS   GI/Hepatic negative GI ROS, Neg liver ROS,   Endo/Other  negative endocrine ROS  Renal/GU negative Renal ROS  negative genitourinary   Musculoskeletal   Abdominal   Peds  Hematology negative hematology ROS (+)   Anesthesia Other Findings   Reproductive/Obstetrics negative OB ROS                           Anesthesia Physical Anesthesia Plan  ASA: III  Anesthesia Plan: General   Post-op Pain Management:    Induction: Intravenous  PONV Risk Score and Plan: 3 and Ondansetron, Dexamethasone and Treatment may vary due to age or medical condition  Airway Management Planned: Oral ETT  Additional Equipment: Arterial line  Intra-op Plan:   Post-operative Plan: Possible Post-op intubation/ventilation and Extubation in OR  Informed Consent: I have reviewed the patients History and Physical, chart, labs and discussed the procedure including the risks, benefits and alternatives for the proposed anesthesia with the patient or authorized representative who has indicated his/her understanding and acceptance.     Dental advisory given  Plan Discussed with: CRNA  Anesthesia Plan Comments:         Anesthesia Quick Evaluation

## 2019-09-01 NOTE — Transfer of Care (Signed)
Immediate Anesthesia Transfer of Care Note  Patient: Haley Mayo  Procedure(s) Performed: IR WITH ANESTHESIA (N/A )  Patient Location: PACU  Anesthesia Type:General  Level of Consciousness: drowsy  Airway & Oxygen Therapy: Patient Spontanous Breathing and Patient connected to face mask oxygen  Post-op Assessment: Report given to RN and Post -op Vital signs reviewed and stable  Post vital signs: Reviewed and stable  Last Vitals:  Vitals Value Taken Time  BP 125/65 09/01/19 1725  Temp    Pulse 81 09/01/19 1730  Resp 21 09/01/19 1730  SpO2 92 % 09/01/19 1730  Vitals shown include unvalidated device data.  Last Pain:  Vitals:   09/01/19 1200  TempSrc: Oral  PainSc: Asleep         Complications: No apparent anesthesia complications

## 2019-09-01 NOTE — Progress Notes (Signed)
Pt seen in PACU. Drowsy but easily arouses. Follows commands, EMOI, tongue midline, MAE symmetrically.

## 2019-09-01 NOTE — Anesthesia Procedure Notes (Signed)

## 2019-09-01 NOTE — Progress Notes (Signed)
PT Cancellation Note  Patient Details Name: Haley Mayo MRN: [id number] DOB: [birth year]   Cancelled Treatment:    Reason Eval/Treat Not Completed: Patient not medically ready. Pt going to IR today for coiling. PT to return s/p procedure as appropriate, as able to complete PT evaluation.  [name], PT, DPT Acute Rehabilitation Services Pager #: [phone] Office #: [phone]    [name] 09/01/2019, 7:42 AM

## 2019-09-01 NOTE — Progress Notes (Signed)
Spoke with PA Vinny; hold 1800 Nimotop dose at this time. Due to waveform of art line, use cuff pressure; keep art line and reassess tomorrow.

## 2019-09-01 NOTE — Consult Note (Addendum)
NAME:  Haley Mayo, MRN:  [id number], DOB:  1951-03-22, LOS: 1 ADMISSION DATE:  08/31/2019, CONSULTATION DATE:  09/01/2019 REFERRING MD:  Dr. Kathyrn Sheriff, CHIEF COMPLAINT:  SAH/ PE  Brief History   69 year old female initially admitted at [facility] for CAP on 5/12 who developed headache and diplopia found to have a [place].  Transferred to [facility] for further neurosurgical care.  Incidently on CTA found to have PE.    History of present illness    69 year old female with prior history of hypertension and tobacco abuse who presented initially to  [facility] on 5/12 after her son sent police for a welfare check.  Patient lives alone, is retired, and does not go out much.  No local family members.  Has two children, a son who lives in [place] and daughter who lives in [place].  Reports that she has some chronic shortness of breath.  She is very vague in her description and can not pinpoint how long ago it started, but thinks it has been several months. Denies chest pain, recent travel, sick exposures, weight loss, leg swelling or pain, or known family history of blood clots other than family history of strokes.  States she received her first dose of the [place] vaccine on 5/4 and started to feel bad three days later with progressive generalized weakness,  poor PO intake, and episode of nausea/ vomiting.  She was found on the floor.  She was noted to be initially hypertensive, hypoxic, with a leukocytosis and initial CXR concerning for bibasilar infiltrates vs atelectasis.  She was admitted and empiric CAP therapy. On 5/16, she developed fever and complained of a headache with diplopia.  CT head obtained which showed a subarachnoid hemorrhage over both convexities and extending into the ventricles and basal cisterns likely arising from a 5 mm basilar tip aneurysm.  CTA confirmed a 9x7 mm aneurysm of the tip of the basilar artery and incidentally showing a right upper lobe PE incompletely visualized.  She was then  transferred to [facility], Neurosurgery admitting. Plans for a stent-coiling today in Neuro IR.  PCCM consulted for further recommendations given pulmonary embolus.   Past Medical History  HTN. Tobacco abuse   Significant Hospital Events   5/12 admitted [facility] with CAP 5/16 overnight transferred to [facility]   Consults:   Procedures:   Significant Diagnostic Tests:   5/16 [facility] >>  Aneurysmal pattern subarachnoid hemorrhage over both convexities and extending into the ventricles and basal cisterns. 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 CTA of the head recommended for better characterization.  5/16 CTA head/ neck >> 1. Unchanged distribution of subarachnoid hemorrhage. 2. 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 3. Right upper lobar pulmonary artery embolus, incompletely visualized. 4. Aortic Atherosclerosis  2.3 cm right thyroid nodule  5/16 CTA PE >> 1. Filling defect within the proximal right upper lobe apical and posterior segmental arteries, consistent with pulmonary embolus. 2. Enlarged main pulmonary artery, consistent with pulmonary hypertension. 3. Moderate cardiomegaly. 4. Aortic Atherosclerosis (ICD10-I70.0). 5. 2.3 cm right thyroid heterogeneous nodule.   5/17 TCDs 5/17 b/l LE doppler study >> limited study, no evidence of DVT   Micro Data:  5/12 SARS 2 >> neg 5/14 UC >> neg 5/14 BC ([facility]) >>  5/16 BCx 2 >> ngtd  Antimicrobials:  5/12 azithro >> 5/16 5/12 ceftriaxone >> 5/15 5/16 cefepime >> 5/17 5/16 vanc  Interim history/subjective:  Complains of headache  Objective   Blood pressure 138/63, pulse 70, temperature 98.4 F (36.9 C), temperature source Oral, resp. rate 16, SpO2 95 %.        Intake/Output Summary (Last 24 hours) at 09/01/2019 1043 Last data filed at 09/01/2019 0900 Gross per 24 hour  Intake 2154.39 ml  Output 2350 ml  Net -195.61 ml   There were no vitals filed for this visit.  Examination: General:   Older female lying in bed in NAD HEENT: MM pink/moist, pupils 3/reactive, slowed response Neuro: Alert, oriented x 4, MAE CV: rr, no murmur PULM:  Non labored, CTA, remains on 1L [place] GI: obese, soft, bs active  Extremities: warm/dry, no LE edema, no calf warmth/ tenderness Skin: no rashes   Resolved Hospital Problem list    Assessment & Plan:   RUL PE  - very sedentary at baseline, hx of tobacco abuse, and obesity are risk factors for provoked PE   - currently hemodynamically stable on 1L [place] - wean supplemental O2 for sat goal > 92% - unable to anticoagulate in the setting of SAH - would continue ASA as below  - empiric IVC filters not recommended  - check TTE given evidence of PH on CTA - consider further hypercoagulable workup as outpatient    SAH with fusiform basilar aneurysm  - per NSGY - SBP goal < 140, cleviprex if needed - going to NIR today for stent-coiling  - serial neuro exams - trending TCDs - continue Nimotop  - ASA/ plavix per NSGY   HTN  - continue norvasc, benazepril   Leukocytosis  - s/p treatment for CAP - remains afebrile, monitor    Incidental right thyroid nodule  - consider outpatient ultrasound for f/u   Best practice:  Diet: NPO Pain/Anxiety/Delirium protocol (if indicated): prn tylenol/ dilaudid for severe VAP protocol (if indicated): n/a DVT prophylaxis: SCDs GI prophylaxis: n/a Glucose control:trend on BMP Mobility: per primary  Code Status: full  Family Communication: patient updated on plan of care Disposition: Neuro ICU   Labs   CBC: Recent Labs  Lab 08/26/19 1638 08/26/19 1638 08/27/19 0453 08/28/19 0510 08/29/19 0439 08/30/19 0702 08/31/19 0321  WBC 27.1*   < > 22.9* 19.1* 18.2* 20.4* 21.5*  NEUTROABS 23.6*  --   --   --   --   --   --   HGB 15.4*   < > 13.0 13.5 13.8 13.9 13.7  HCT 47.1*   < > 40.1 40.5 39.6 41.5 41.8  MCV 84.3   < > 85.1 82.0 80.8 81.4 83.6  PLT 304   < > 270 262 251 266 283   < > = values in  this interval not displayed.    Basic Metabolic Panel: Recent Labs  Lab 08/27/19 0453 08/27/19 0453 08/28/19 0510 08/29/19 0439 08/29/19 1857 08/30/19 0702 08/31/19 0321  NA 144  --  142 139  --  139 135  K 2.9*   < > 2.6* 2.8* 4.3 3.2* 3.4*  CL 112*  --  109 106  --  104 103  CO2 24  --  24 24  --  25 24  GLUCOSE 163*  --  132* 151*  --  147* 199*  BUN 17  --  9 9  --  10 10  CREATININE 0.58  --  0.49 0.49  --  0.41* 0.70  CALCIUM 8.6*  --  8.2* 8.3*  --  8.5* 8.8*   < > = values in  this interval not displayed.   GFR: Estimated Creatinine Clearance: 61.1 mL/min (by C-G formula based on SCr of 0.7 mg/dL). Recent Labs  Lab 08/26/19 1638 08/27/19 0453 08/28/19 0510 08/29/19 0439 08/30/19 0702 08/31/19 0321  PROCALCITON 0.12  --   --   --   --   --   WBC 27.1*   < > 19.1* 18.2* 20.4* 21.5*  LATICACIDVEN 1.2  --   --   --   --   --    < > = values in this interval not displayed.    Liver Function Tests: Recent Labs  Lab 08/26/19 1638 08/27/19 0453 08/28/19 0510 08/30/19 0702  AST 99* 47* 30 21  ALT 71* 53* 45* 39  ALKPHOS 77 69 63 68  BILITOT 1.3* 0.7 0.9 0.7  PROT 8.4* 7.0 6.8 7.1  ALBUMIN 4.0 3.3* 3.1* 3.1*   No results for input(s): LIPASE, AMYLASE in the last 168 hours. No results for input(s): AMMONIA in the last 168 hours.  ABG No results found for: PHART, PCO2ART, PO2ART, HCO3, TCO2, ACIDBASEDEF, O2SAT   Coagulation Profile: Recent Labs  Lab 08/31/19 0321  INR 1.1    Cardiac Enzymes: Recent Labs  Lab 08/26/19 1638 08/27/19 0453 08/28/19 0510 08/29/19 0439  CKTOTAL 3,162* 1,685* 711* 366*    HbA1C: Hgb A1c MFr Bld  Date/Time Value Ref Range Status  01/25/2016 10:20 AM 6.4 (H) 4.8 - 5.6 % Final    Comment:             Pre-diabetes: 5.7 - 6.4          Diabetes: >6.4          Glycemic control for adults with diabetes: <7.0   02/02/2011 09:39 AM 6.5 4.6 - 6.5 % Final    Comment:    Glycemic Control Guidelines for People with  Diabetes:Non Diabetic:  <6%Goal of Therapy: <7%Additional Action Suggested:  >8%     CBG: No results for input(s): GLUCAP in the last 168 hours.  Review of Systems:   As per HPI otherwise negative.    Past Medical History  She,  has a past medical history of HLD (hyperlipidemia), HTN (hypertension), Leukocytosis, Neoplasm of ear, Obesity, and Wears dentures.   Surgical History    Past Surgical History:  Procedure Laterality Date  . ABDOMINAL HYSTERECTOMY  Age 18  . COLONOSCOPY  09/2004   normal, rpt 10 yrs  . EAR CYST EXCISION Left 09/27/2017   Procedure: EXCISION EAR LOBE CYST;  Surgeon: Beverly Gust, MD;  Location: [place];  Service: ENT;  Laterality: Left;  LOCAL ONLY  . OOPHORECTOMY    . TOTAL VAGINAL HYSTERECTOMY  05/2001   heavy bleeding, fibroids     Social History   reports that she has been smoking cigarettes. She has a 40.00 pack-year smoking history. She uses smokeless tobacco. She reports that she does not drink alcohol or use drugs.   Family History   Her family history includes Diabetes in her sister and sister; Lupus in her sister and sister; Other in her sister; Pneumonia in her mother; Rheum arthritis in her sister; Stroke in her brother and sister; Ulcers in her father.   Allergies No Known Allergies   Home Medications  Prior to Admission medications   Medication Sig Start Date End Date Taking? Authorizing Provider  acetaminophen (TYLENOL) 325 MG tablet Take 2 tablets (650 mg total) by mouth every 6 (six) hours as needed for mild pain, fever or headache. 08/30/19  Yes Athena Masse, MD  amLODipine-benazepril (LOTREL) 10-20 MG capsule Take 1 capsule by mouth every evening.    Yes [provider]  Coenzyme Q10-Omega 3 Fatty Acd (HEALTHY HEART) EMUL Take 1 capsule by mouth daily.     Yes [provider]  Multiple Vitamins-Minerals (EMERGEN-C VITAMIN C PO) Take 1 tablet by mouth daily.    Yes [provider]  amLODipine  (NORVASC) 10 MG tablet Take 1 tablet (10 mg total) by mouth daily. Patient not taking: Reported on 08/31/2019 08/31/19   Athena Masse, MD  benazepril (LOTENSIN) 20 MG tablet Take 1 tablet (20 mg total) by mouth daily. Patient not taking: Reported on 08/31/2019 08/31/19   Athena Masse, MD  ceFEPIme 2 g in sodium chloride 0.9 % 100 mL Inject 2 g into the vein every 8 (eight) hours. Patient not taking: Reported on 08/31/2019 08/30/19   Athena Masse, MD  enoxaparin (LOVENOX) 40 MG/0.4ML injection Inject 0.4 mLs (40 mg total) into the skin daily. Patient not taking: Reported on 08/31/2019 08/31/19   Athena Masse, MD  niMODipine (NIMOTOP) 30 MG capsule Take 2 capsules (60 mg total) by mouth every 4 (four) hours. Patient not taking: Reported on 08/31/2019 08/30/19   Athena Masse, MD  polyethylene glycol (MIRALAX / GLYCOLAX) 17 g packet Take 17 g by mouth daily. Patient not taking: Reported on 08/31/2019 08/31/19   Athena Masse, MD  vancomycin (VANCOREADY) 1250 MG/250ML SOLN Inject 250 mLs (1,250 mg total) into the vein daily. Patient not taking: Reported on 08/31/2019 08/30/19   Athena Masse, MD           Kennieth Rad, MSN, AGACNP-BC  Pulmonary & Critical Care 09/01/2019, 10:43 AM  See Shea Evans for personal pager PCCM on call pager [phone]   PCCM attending:  69 year old admitted for headache diplopia found to have subarachnoid hemorrhage.  CTA incidentally found small subsegmental PE.  BP 130/70   Pulse 79   Temp 98.9 F (37.2 C)   Resp (!) 21   SpO2 91%   General: Female resting in bed no distress HEENT: NCAT sclera clear Heart: Regular rhythm S1-S2 Lungs: Clear to auscultation bilaterally  Assessment: Right upper lobe PE, small segmental Subarachnoid hemorrhage fusiform basilar aneurysm, aspirin and Plavix Hypertension Leukocytosis  Plan: Okay to hold on anticoagulation Continue antiplatelets at this time. Once potentially cleared for anticoagulation  can restart. Lower extremity Dopplers negative No need for IVC filter at this time. Pulmonary will follow.  This patient is critically ill with multiple organ system failure; which, requires frequent high complexity decision making, assessment, support, evaluation, and titration of therapies. This was completed through the application of advanced monitoring technologies and extensive interpretation of multiple databases. During this encounter critical care time was devoted to patient care services described in this note for 31 minutes.  [facility] 09/01/2019 7:12 PM

## 2019-09-02 ENCOUNTER — Encounter: Payer: Self-pay | Admitting: *Deleted

## 2019-09-02 ENCOUNTER — Inpatient Hospital Stay (HOSPITAL_COMMUNITY): Payer: Medicare Other

## 2019-09-02 DIAGNOSIS — I609 Nontraumatic subarachnoid hemorrhage, unspecified: Secondary | ICD-10-CM

## 2019-09-02 HISTORY — PX: IR IVC FILTER PLMT / S&I /IMG GUID/MOD SED: IMG701

## 2019-09-02 LAB — GLUCOSE, CAPILLARY
Glucose-Capillary: 144 mg/dL — ABNORMAL HIGH (ref 70–99)
Glucose-Capillary: 168 mg/dL — ABNORMAL HIGH (ref 70–99)
Glucose-Capillary: 193 mg/dL — ABNORMAL HIGH (ref 70–99)
Glucose-Capillary: 260 mg/dL — ABNORMAL HIGH (ref 70–99)

## 2019-09-02 LAB — CBC
HCT: 33.3 % — ABNORMAL LOW (ref 36.0–46.0)
Hemoglobin: 10.9 g/dL — ABNORMAL LOW (ref 12.0–15.0)
MCH: 27.5 pg (ref 26.0–34.0)
MCHC: 32.7 g/dL (ref 30.0–36.0)
MCV: 84.1 fL (ref 80.0–100.0)
Platelets: 310 10*3/uL (ref 150–400)
RBC: 3.96 MIL/uL (ref 3.87–5.11)
RDW: 14.4 % (ref 11.5–15.5)
WBC: 18.5 10*3/uL — ABNORMAL HIGH (ref 4.0–10.5)
nRBC: 0 % (ref 0.0–0.2)

## 2019-09-02 LAB — BASIC METABOLIC PANEL
Anion gap: 11 (ref 5–15)
BUN: 8 mg/dL (ref 8–23)
CO2: 24 mmol/L (ref 22–32)
Calcium: 7.7 mg/dL — ABNORMAL LOW (ref 8.9–10.3)
Chloride: 105 mmol/L (ref 98–111)
Creatinine, Ser: 0.55 mg/dL (ref 0.44–1.00)
GFR calc Af Amer: >60 mL/min (ref >60)
GFR calc non Af Amer: >60 mL/min (ref >60)
Glucose, Bld: 228 mg/dL — ABNORMAL HIGH (ref 70–99)
Potassium: 2 mmol/L — CL (ref 3.5–5.1)
Sodium: 140 mmol/L (ref 135–145)

## 2019-09-02 LAB — CULTURE, BLOOD (ROUTINE X 2)
Culture: NO GROWTH
Culture: NO GROWTH
Special Requests: ADEQUATE

## 2019-09-02 LAB — CREATININE, SERUM
Creatinine, Ser: 0.62 mg/dL (ref 0.44–1.00)
GFR calc Af Amer: >60 mL/min (ref >60)
GFR calc non Af Amer: >60 mL/min (ref >60)

## 2019-09-02 LAB — MAGNESIUM
Magnesium: 1.5 mg/dL — ABNORMAL LOW (ref 1.7–2.4)
Magnesium: 2.3 mg/dL (ref 1.7–2.4)

## 2019-09-02 LAB — PHOSPHORUS
Phosphorus: 2.2 mg/dL — ABNORMAL LOW (ref 2.5–4.6)
Phosphorus: 1.8 mg/dL — ABNORMAL LOW (ref 2.5–4.6)

## 2019-09-02 MED ORDER — LIDOCAINE HCL 1 % IJ SOLN
INTRAMUSCULAR | Status: AC
  Filled 2019-09-02: qty 20

## 2019-09-02 MED ORDER — VITAL AF 1.2 CAL PO LIQD
1000.0000 mL | ORAL | Status: DC
  Administered 2019-09-02 – 2019-09-05 (×4): 1000 mL

## 2019-09-02 MED ORDER — SODIUM CHLORIDE 0.9 % IV SOLN: 250.0000 mL | INTRAVENOUS | Status: DC

## 2019-09-02 MED ORDER — PHENYLEPHRINE HCL-NACL 10-0.9 MG/250ML-% IV SOLN
25.0000 ug/min | INTRAVENOUS | Status: DC
  Administered 2019-09-02: 185 ug/min via INTRAVENOUS
  Administered 2019-09-02: 200 ug/min via INTRAVENOUS
  Administered 2019-09-02: 25 ug/min via INTRAVENOUS
  Filled 2019-09-02 (×2): qty 250
  Filled 2019-09-02: qty 500

## 2019-09-02 MED ORDER — ACETAMINOPHEN 325 MG PO TABS
650.0000 mg | ORAL_TABLET | Freq: Four times a day (QID) | ORAL | Status: DC | PRN
  Administered 2019-09-02 – 2019-09-06 (×7): 650 mg
  Filled 2019-09-02 (×8): qty 2

## 2019-09-02 MED ORDER — ACETAMINOPHEN 650 MG RE SUPP: 650.0000 mg | Freq: Four times a day (QID) | RECTAL | Status: DC | PRN

## 2019-09-02 MED ORDER — VITAL HIGH PROTEIN PO LIQD: 1000.0000 mL | ORAL | Status: DC

## 2019-09-02 MED ORDER — POTASSIUM & SODIUM PHOSPHATES 280-160-250 MG PO PACK: 1.0000 | PACK | ORAL | Status: DC

## 2019-09-02 MED ORDER — POTASSIUM & SODIUM PHOSPHATES 280-160-250 MG PO PACK
1.0000 | PACK | ORAL | Status: AC
  Administered 2019-09-02 (×2): 1
  Filled 2019-09-02 (×2): qty 1

## 2019-09-02 MED ORDER — SODIUM CHLORIDE 0.9 % IV SOLN: INTRAVENOUS | Status: DC | PRN

## 2019-09-02 MED ORDER — NIMODIPINE 30 MG PO CAPS
60.0000 mg | ORAL_CAPSULE | ORAL | Status: DC
  Administered 2019-09-07: 60 mg via ORAL
  Filled 2019-09-02 (×2): qty 2

## 2019-09-02 MED ORDER — FENTANYL CITRATE (PF) 100 MCG/2ML IJ SOLN
INTRAMUSCULAR | Status: AC
  Filled 2019-09-02: qty 2

## 2019-09-02 MED ORDER — CLOPIDOGREL BISULFATE 75 MG PO TABS
75.0000 mg | ORAL_TABLET | Freq: Every day | ORAL | Status: DC
  Administered 2019-09-03 – 2019-09-07 (×5): 75 mg
  Filled 2019-09-02 (×5): qty 1

## 2019-09-02 MED ORDER — SODIUM CHLORIDE 0.9% FLUSH
10.0000 mL | Freq: Two times a day (BID) | INTRAVENOUS | Status: DC
  Administered 2019-09-02: 10 mL
  Administered 2019-09-02: 30 mL
  Administered 2019-09-03 (×2): 20 mL
  Administered 2019-09-04 – 2019-09-08 (×10): 10 mL

## 2019-09-02 MED ORDER — MAGNESIUM SULFATE 4 GM/100ML IV SOLN
4.0000 g | Freq: Once | INTRAVENOUS | Status: AC
  Administered 2019-09-02: 4 g via INTRAVENOUS
  Filled 2019-09-02: qty 100

## 2019-09-02 MED ORDER — ALBUMIN HUMAN 5 % IV SOLN
12.5000 g | Freq: Once | INTRAVENOUS | Status: AC
  Administered 2019-09-02: 12.5 g via INTRAVENOUS
  Filled 2019-09-02: qty 250

## 2019-09-02 MED ORDER — MIDAZOLAM HCL 2 MG/2ML IJ SOLN
INTRAMUSCULAR | Status: AC
  Filled 2019-09-02: qty 2

## 2019-09-02 MED ORDER — NOREPINEPHRINE 4 MG/250ML-% IV SOLN
0.0000 ug/min | INTRAVENOUS | Status: DC
  Administered 2019-09-02: 5 ug/min via INTRAVENOUS
  Filled 2019-09-02 (×2): qty 250

## 2019-09-02 MED ORDER — NIMODIPINE 6 MG/ML PO SOLN
60.0000 mg | ORAL | Status: DC
  Administered 2019-09-02 – 2019-09-07 (×30): 60 mg
  Filled 2019-09-02 (×24): qty 10

## 2019-09-02 MED ORDER — POTASSIUM CHLORIDE 20 MEQ PO PACK
40.0000 meq | PACK | Freq: Once | ORAL | Status: AC
  Administered 2019-09-02: 40 meq
  Filled 2019-09-02: qty 2

## 2019-09-02 MED ORDER — PHENYLEPHRINE HCL-NACL 10-0.9 MG/250ML-% IV SOLN
0.0000 ug/min | INTRAVENOUS | Status: DC
  Administered 2019-09-02: 220 ug/min via INTRAVENOUS
  Administered 2019-09-02: 180 ug/min via INTRAVENOUS
  Administered 2019-09-02: 110 ug/min via INTRAVENOUS
  Administered 2019-09-02: 80 ug/min via INTRAVENOUS
  Administered 2019-09-02: 200 ug/min via INTRAVENOUS
  Administered 2019-09-02: 260 ug/min via INTRAVENOUS
  Administered 2019-09-02: 150 ug/min via INTRAVENOUS
  Administered 2019-09-02: 120 ug/min via INTRAVENOUS
  Administered 2019-09-02: 260 ug/min via INTRAVENOUS
  Filled 2019-09-02: qty 250
  Filled 2019-09-02 (×3): qty 500
  Filled 2019-09-02: qty 250

## 2019-09-02 MED ORDER — LIDOCAINE HCL 1 % IJ SOLN
INTRAMUSCULAR | Status: AC | PRN
  Administered 2019-09-02: 5 mL

## 2019-09-02 MED ORDER — PRO-STAT SUGAR FREE PO LIQD: 30.0000 mL | Freq: Two times a day (BID) | ORAL | Status: DC

## 2019-09-02 MED ORDER — POLYETHYLENE GLYCOL 3350 17 G PO PACK
17.0000 g | PACK | Freq: Every day | ORAL | Status: DC
  Administered 2019-09-04 – 2019-09-05 (×2): 17 g
  Filled 2019-09-02 (×2): qty 1

## 2019-09-02 MED ORDER — IOHEXOL 300 MG/ML  SOLN
100.0000 mL | Freq: Once | INTRAMUSCULAR | Status: AC | PRN
  Administered 2019-09-02: 40 mL via INTRAVENOUS

## 2019-09-02 MED ORDER — IOHEXOL 350 MG/ML SOLN
75.0000 mL | Freq: Once | INTRAVENOUS | Status: AC | PRN
  Administered 2019-09-02: 75 mL via INTRAVENOUS

## 2019-09-02 MED ORDER — SODIUM CHLORIDE 0.9% FLUSH: 10.0000 mL | INTRAVENOUS | Status: DC | PRN

## 2019-09-02 MED ORDER — HEPARIN SODIUM (PORCINE) 5000 UNIT/ML IJ SOLN
5000.0000 [IU] | Freq: Three times a day (TID) | INTRAMUSCULAR | Status: DC
  Administered 2019-09-02 – 2019-09-10 (×26): 5000 [IU] via SUBCUTANEOUS
  Filled 2019-09-02 (×23): qty 1

## 2019-09-02 MED ORDER — ASPIRIN 325 MG PO TABS
325.0000 mg | ORAL_TABLET | Freq: Every day | ORAL | Status: DC
  Administered 2019-09-03 – 2019-09-07 (×5): 325 mg
  Filled 2019-09-02 (×5): qty 1

## 2019-09-02 MED ORDER — SODIUM CHLORIDE 0.9 % IV SOLN
1.0000 g | INTRAVENOUS | Status: DC
  Administered 2019-09-02 – 2019-09-03 (×2): 1 g via INTRAVENOUS
  Filled 2019-09-02: qty 10
  Filled 2019-09-02 (×2): qty 1
  Filled 2019-09-02: qty 10

## 2019-09-02 MED ORDER — POTASSIUM CHLORIDE 10 MEQ/50ML IV SOLN
10.0000 meq | INTRAVENOUS | Status: AC
  Administered 2019-09-02 (×2): 10 meq via INTRAVENOUS
  Filled 2019-09-02 (×2): qty 50

## 2019-09-02 NOTE — Sedation Documentation (Signed)
Patient is resting comfortably. 

## 2019-09-02 NOTE — Sedation Documentation (Signed)
Procedure started.

## 2019-09-02 NOTE — Consult Note (Signed)
NAME:  FAYOLA RYCZEK, MRN:  [id number], DOB:  [birth year], LOS: 2 ADMISSION DATE:  08/31/2019, CONSULTATION DATE:  09/01/2019 REFERRING MD:  Dr. Kathyrn Sheriff, CHIEF COMPLAINT:  SAH/ PE  Brief History   69 year old female initially admitted at [facility] for CAP on 5/12 who developed headache and diplopia found to have a [place].  Transferred to [facility] for further neurosurgical care.  Incidently on CTA found to have PE.    History of present illness    69 year old female with prior history of hypertension and tobacco abuse who presented initially to  [facility] on 5/12 after her son sent police for a welfare check.  Patient lives alone, is retired, and does not go out much.  No local family members.  Has two children, a son who lives in [place] and daughter who lives in [place].  Reports that she has some chronic shortness of breath.  She is very vague in her description and can not pinpoint how long ago it started, but thinks it has been several months. Denies chest pain, recent travel, sick exposures, weight loss, leg swelling or pain, or known family history of blood clots other than family history of strokes.  States she received her first dose of the [place] vaccine on 5/4 and started to feel bad three days later with progressive generalized weakness,  poor PO intake, and episode of nausea/ vomiting.  She was found on the floor.  She was noted to be initially hypertensive, hypoxic, with a leukocytosis and initial CXR concerning for bibasilar infiltrates vs atelectasis.  She was admitted and empiric CAP therapy. On 5/16, she developed fever and complained of a headache with diplopia.  CT head obtained which showed a subarachnoid hemorrhage over both convexities and extending into the ventricles and basal cisterns likely arising from a 5 mm basilar tip aneurysm.  CTA confirmed a 9x7 mm aneurysm of the tip of the basilar artery and incidentally showing a right upper lobe PE incompletely visualized.  She was then  transferred to [facility], Neurosurgery admitting. Plans for a stent-coiling today in Neuro IR.  PCCM consulted for further recommendations given pulmonary embolus.   Past Medical History  HTN. Tobacco abuse   Significant Hospital Events   5/12 admitted [facility] with CAP 5/16 overnight transferred to [facility] 5/18 Stent and coiling of fusiform basilar aneurysm. 5/19 - started hemodynamic 3 vessel vasospasm.    Consults:   Procedures:   Significant Diagnostic Tests:   5/16 [facility] >>  Aneurysmal pattern subarachnoid hemorrhage over both convexities and extending into the ventricles and basal cisterns. 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 CTA of the head recommended for better characterization.  5/16 CTA head/ neck >> 1. Unchanged distribution of subarachnoid hemorrhage. 2. 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 3. Right upper lobar pulmonary artery embolus, incompletely visualized. 4. Aortic Atherosclerosis  2.3 cm right thyroid nodule  5/16 CTA PE >> 1. Filling defect within the proximal right upper lobe apical and posterior segmental arteries, consistent with pulmonary embolus. 2. Enlarged main pulmonary artery, consistent with pulmonary hypertension. 3. Moderate cardiomegaly. 4. Aortic Atherosclerosis (ICD10-I70.0). 5. 2.3 cm right thyroid heterogeneous nodule.   5/17 TCDs 5/17 b/l LE doppler study >> limited study, no evidence of DVT   5/18 CTA: continued severe spasm of bilateral A1/A2, M1/M2, and basilar arteries. Stent construct appears patent with spasm involving PCA as well. No new hemorrhage or HCP  Micro Data:  5/12 SARS 2 >> neg 5/14 UC >> neg 5/14 BC ([facility]) >>  5/16 BCx 2 >> ngtd  Antimicrobials:  5/12 azithro >> 5/16 5/12 ceftriaxone >> 5/15 5/16 cefepime >> 5/17 5/16 vanc  Interim history/subjective:  Increasing lethargy today with right pronator drift. Vasospasm yesterday on CTA   Objective   Blood pressure (!) 142/73,  pulse 80, temperature (!) 101.6 F (38.7 C), temperature source Axillary, resp. rate (!) 25, SpO2 100 %.        Intake/Output Summary (Last 24 hours) at 09/02/2019 1238 Last data filed at 09/02/2019 1100 Gross per 24 hour  Intake 3554.63 ml  Output 4100 ml  Net -545.37 ml   There were no vitals filed for this visit.  Examination: General:  Older female lying in bed, somnolent. HEENT: MM pink/moist, pupils 3/reactive, slowed response Neuro: Somnolent.  CV: rr, no murmur PULM:  Non labored, CTA, remains on 1L [place] GI: obese, soft, bs active  Extremities: warm/dry, no LE edema, no calf warmth/ tenderness Skin: no rashes   I performed a POC echocardiogram  TDS with limited acoustic windows. LV normal size and function with EF measured at 56%. Grade 1 diastolic dysfunction. Mild MR, trace TR. AV mildly calcified with normal excursion. RV free wall appears thickened with normal cavity size. RVSP estimated 30-35.  IVC distended with increased respiratory variation during exaggerated breathing.   Resolved Hospital Problem list    Assessment & Plan:   Critically ill due to cerebral vasospasm from aneurysmal SAH requiring initiation of vasopressors and fluid resuscitation  - Target hemodynamic augmentation to keep SBP 180, if no improvement will increase to  200 SBP. -CVP monitoring to maintain euvolemia.  At risk for cerebral salt wasting.  - Follow TCD - Enteral access for Nimodipine - Rule out Seizures as contributor to encephalopathy.   RUL PE due to immobility following SAH. No clearly associated pneumonia.  - very sedentary at baseline, hx of tobacco abuse, and obesity are risk factors for provoked PE   - currently hemodynamically stable on 1L [place] - wean supplemental O2 for sat goal > 92% - Place IVC filter as unable to anticoagulate.  SAH with fusiform basilar aneurysm - status post coil and stent placement. - Continue DAPT for stent  HTN  - hold all antihypertensive therapy  for now.  Leukocytosis  - reactive. - s/p treatment for CAP - remains afebrile, monitor   Incidental right thyroid nodule  - consider outpatient ultrasound for f/u    Daily Goals Checklist  Pain/Anxiety/Delirium protocol (if indicated): prn only Neuro vitals: every 1 hours AED's: none VAP protocol (if indicated): not intubated Respiratory support goals: HOB 45  Blood pressure target: 170-190 DVT prophylaxis: heparin tid Nutrition Status: initiate tube feeds GI prophylaxis: protonix Fluid status goals: CVp monitoring, keep euvolemic Urinary catheter: Guide hemodynamic management Central lines: left [place] triple lumen Glucose control: well controlled on no therapy. Observe for increase once feeding started.  Mobility/therapy needs: bedrest  Antibiotic de-escalation: complete 7 days for CAP vs Aspiration  Home medication reconciliation: on hold Daily labs: BMET bid, CBC daily Code Status: full Family Communication:  Updated son 5/19 Disposition: ICU   Labs   CBC: Recent Labs  Lab 08/26/19 1638 08/27/19 0453 08/28/19 0510 08/29/19 0439 08/30/19 0702 08/31/19 0321 09/02/19 0946  WBC 27.1*   < > 19.1* 18.2* 20.4* 21.5* 18.5*  NEUTROABS 23.6*  --   --   --   --   --   --  HGB 15.4*   < > 13.5 13.8 13.9 13.7 10.9*  HCT 47.1*   < > 40.5 39.6 41.5 41.8 33.3*  MCV 84.3   < > 82.0 80.8 81.4 83.6 84.1  PLT 304   < > 262 251 266 283 310   < > = values in this interval not displayed.    Basic Metabolic Panel: Recent Labs  Lab 08/27/19 0453 08/27/19 0453 08/28/19 0510 08/29/19 0439 08/29/19 1857 08/30/19 0702 08/31/19 0321 09/02/19 0946  NA 144  --  142 139  --  139 135  --   K 2.9*   < > 2.6* 2.8* 4.3 3.2* 3.4*  --   CL 112*  --  109 106  --  104 103  --   CO2 24  --  24 24  --  25 24  --   GLUCOSE 163*  --  132* 151*  --  147* 199*  --   BUN 17  --  9 9  --  10 10  --   CREATININE 0.58   < > 0.49 0.49  --  0.41* 0.70 0.62  CALCIUM 8.6*  --  8.2* 8.3*  --  8.5*  8.8*  --   MG  --   --   --   --   --   --   --  1.5*  PHOS  --   --   --   --   --   --   --  2.2*   < > = values in this interval not displayed.   GFR: Estimated Creatinine Clearance: 61.1 mL/min (by C-G formula based on SCr of 0.62 mg/dL). Recent Labs  Lab 08/26/19 1638 08/27/19 0453 08/29/19 0439 08/30/19 0702 08/31/19 0321 09/02/19 0946  PROCALCITON 0.12  --   --   --   --   --   WBC 27.1*   < > 18.2* 20.4* 21.5* 18.5*  LATICACIDVEN 1.2  --   --   --   --   --    < > = values in this interval not displayed.    Liver Function Tests: Recent Labs  Lab 08/26/19 1638 08/27/19 0453 08/28/19 0510 08/30/19 0702  AST 99* 47* 30 21  ALT 71* 53* 45* 39  ALKPHOS 77 69 63 68  BILITOT 1.3* 0.7 0.9 0.7  PROT 8.4* 7.0 6.8 7.1  ALBUMIN 4.0 3.3* 3.1* 3.1*   No results for input(s): LIPASE, AMYLASE in the last 168 hours. No results for input(s): AMMONIA in the last 168 hours.  ABG No results found for: PHART, PCO2ART, PO2ART, HCO3, TCO2, ACIDBASEDEF, O2SAT   Coagulation Profile: Recent Labs  Lab 08/31/19 0321  INR 1.1    Cardiac Enzymes: Recent Labs  Lab 08/26/19 1638 08/27/19 0453 08/28/19 0510 08/29/19 0439  CKTOTAL 3,162* 1,685* 711* 366*    HbA1C: Hgb A1c MFr Bld  Date/Time Value Ref Range Status  01/25/2016 10:20 AM 6.4 (H) 4.8 - 5.6 % Final    Comment:             Pre-diabetes: 5.7 - 6.4          Diabetes: >6.4          Glycemic control for adults with diabetes: <7.0   02/02/2011 09:39 AM 6.5 4.6 - 6.5 % Final    Comment:    Glycemic Control Guidelines for People with Diabetes:Non Diabetic:  <6%Goal of Therapy: <7%Additional Action Suggested:  >8%     CBG: Recent  Labs  Lab 09/02/19 0850  GLUCAP 144*   CRITICAL CARE Performed by: Kipp Brood   Total critical care time: 60 minutes  Critical care time was exclusive of separately billable procedures and treating other patients.  Critical care was necessary to treat or prevent imminent or  life-threatening deterioration.  Critical care was time spent personally by me on the following activities: development of treatment plan with patient and/or surrogate as well as nursing, discussions with consultants, evaluation of patient's response to treatment, examination of patient, obtaining history from patient or surrogate, ordering and performing treatments and interventions, ordering and review of laboratory studies, ordering and review of radiographic studies, pulse oximetry, re-evaluation of patient's condition and participation in multidisciplinary rounds.  Kipp Brood, MD [facility] ICU Physician [place]  Pager: [phone] Mobile: [phone] After hours: [phone].   09/02/2019, 12:38 PM

## 2019-09-02 NOTE — Progress Notes (Signed)
OT Cancellation    09/02/19 0800  OT Visit Information  Last OT Received On 09/02/19  Reason Eval/Treat Not Completed Medical issues which prohibited therapy (vasospasms)  Maurie Boettcher, OT/L   Acute OT Clinical Specialist [place] Pager [phone] Office [phone]

## 2019-09-02 NOTE — Progress Notes (Signed)
Attempted to give pt 0600 nimotop, but pt unarousable even after repeated painful stimulation.  Pt unable to open eyes or follow commands.  Moves all extremities to pain.  [employer], on call, notified.  STAT CTA head ordered.

## 2019-09-02 NOTE — Brief Op Note (Addendum)
  NEUROSURGERY BRIEF OPERATIVE  NOTE   PREOP DX: Subarachnoid hemorrhage  POSTOP DX: Same  PROCEDURE: Diagnostic cerebral angiogram, stent-supported coil embolization of basilar aneurysm  SURGEON: Dr. Consuella Lose, MD  ANESTHESIA: GETA  EBL: Minimal  SPECIMENS: None  COMPLICATIONS: None  CONDITION: Stable to recovery  FINDINGS (Full report in [facility]): 1. Large, fusiform basilar apex aneurysm involving bilateral PCA and bilateral SCA origins. Successful partial coil embolization of the aneurysm with occlusion of the dome with Y-stent support. 2. Moderate to severe vasospasm involving the bilateral A1/A2, right M1/M2, left M2 branches, as well as the mid basilar and distal V4 segments.

## 2019-09-02 NOTE — Procedures (Signed)
Arterial Catheter Insertion Procedure Note ROBBYE SPANGLER [id number] 1950/11/22  Procedure: Insertion of Arterial Catheter  Indications: Blood pressure monitoring  Procedure Details Consent: Risks of procedure as well as the alternatives and risks of each were explained to the (patient/caregiver).  Consent for procedure obtained. Time Out: Verified patient identification, verified procedure, site/side was marked, verified correct patient position, special equipment/implants available, medications/allergies/relevent history reviewed, required imaging and test results available.  Performed  Maximum sterile technique was used including antiseptics, cap, gloves, gown, hand hygiene, mask and sheet. Skin prep: Chlorhexidine; local anesthetic administered 20 gauge catheter was inserted into right radial artery using the Seldinger technique. ULTRASOUND GUIDANCE USED: NO Evaluation Blood flow good; BP tracing good. Complications: No apparent complications.   Mcneil Sober 09/02/2019

## 2019-09-02 NOTE — Progress Notes (Signed)
Initial Nutrition Assessment  DOCUMENTATION CODES:   Not applicable  INTERVENTION:   Initiate tube feeding via Cortrak tube: Vital AF 1.2 at 60 ml/h (1440 ml per day)  Provides 1728 kcal, 108 gm protein, 1167 ml free water daily   NUTRITION DIAGNOSIS:   Inadequate oral intake related to inability to eat as evidenced by NPO status.  GOAL:   Patient will meet greater than or equal to 90% of their needs  MONITOR:   TF tolerance, Labs  REASON FOR ASSESSMENT:   Consult Enteral/tube feeding initiation and management  ASSESSMENT:   Pt with PMH of HTN and tobacco abuse who was admitted to [facility] 5/12 with CAP after feeling bad at home since 5/7. Pt lives alone and does not go out much.    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  Per RN noted change in pt this am, CT showed severe neurospasm. Per RN pt now maxed on neo due to SBP goal of 150-180. Pt currently on 3 L O2 via [place] but belly breathing per RN. CCM in to evaluate.    Medications reviewed and include: miralax  Neosynephrine @ 185 mcg  Labs reviewed: PO4: 2.2 (L), Magnesium: 1.5 (L), K: 3.4 (L)   I&O: -3.3 L  UOP: 5825 ml   NUTRITION - FOCUSED PHYSICAL EXAM:  Deferred; central line being placed   Diet Order:   Diet Order            Diet NPO time specified  Diet effective now              EDUCATION NEEDS:   No education needs have been identified at this time  Skin:  Skin Assessment: Reviewed RN Assessment  Last BM:  5/16  Height:   Ht Readings from Last 1 Encounters:  08/30/19 5' (1.524 m)    Weight:   Wt Readings from Last 1 Encounters:  08/30/19 73.5 kg    Ideal Body Weight:  45.4 kg  BMI:  There is no height or weight on file to calculate BMI.  Estimated Nutritional Needs:   Kcal:  1650-1850  Protein:  90-110 grams  Fluid:  > 1.7 L/day  Lockie Pares., RD, LDN, CNSC See AMiON for  contact information

## 2019-09-02 NOTE — Progress Notes (Signed)
Arterial line and BP cuff not correlating-- Paged Agarwala MD; at this time go by art line. Wean levo first. SBP goal <200 to match clinical evaluation of patient.  Patient more responsive at this time.

## 2019-09-02 NOTE — Procedures (Signed)
Interventional Radiology Procedure Note  Procedure: Placement of IVC filter via a right IJ approach.  .  Complications: None \ Recommendations:    - Routine wound care   Signed,  Dulcy Fanny. Earleen Newport, DO

## 2019-09-02 NOTE — Progress Notes (Signed)
CRITICAL VALUE ALERT  Critical Value: potassium 2.0  Date & Time Notied:  09/02/19 1818  Provider Notified: J. Marcello Moores MD [phone]  Orders Received/Actions taken: see [facility]

## 2019-09-02 NOTE — Procedures (Signed)
ELECTROENCEPHALOGRAM REPORT   Patient: Haley Mayo       Room #: 4N17C EEG No. ID: [id number] Age: 69 y.o.        Sex: female Requesting Physician: Kathyrn Sheriff Report Date:  09/02/2019        Interpreting Physician: Alexis Goodell  History: Haley Mayo is an 69 y.o. female with altered mental status  Medications:  ASA, Rocephin, Plavix, Nimotop, Levophed, Neosynephrine  Conditions of Recording:  This is a 21 channel routine scalp EEG performed with bipolar and monopolar montages arranged in accordance to the international 10/20 system of electrode placement. One channel was dedicated to EKG recording.  The patient is in the altered state.  Description:  The background activity is slow and poorly organized.  It consists of low voltage activity in the delta theta continuum.  This activity is continuous and diffusely distributed.  No epileptiform activity is noted.  Hyperventilation and intermittent photic stimulation were not performed.  IMPRESSION: This is an abnormal EEG secondary to general background slowing.  This finding may be seen with a diffuse disturbance that is etiologically nonspecific, but may include a metabolic encephalopathy, among other possibilities.  No epileptiform activity was noted.     Alexis Goodell, MD Neurology [phone] 09/02/2019, 6:12 PM

## 2019-09-02 NOTE — Consult Note (Signed)
Chief Complaint: Patient was seen in consultation today for PE/IVCF placement.  Referring Physician(s): Kipp Brood  Supervising Physician: Luanne Bras  Patient Status: [facility] - In-pt  History of Present Illness: Haley Mayo is a 69 y.o. female with a past medical history of hypertension, hyperlipidemia, and obesity. She presented to [facility] ED via EMS 08/26/2019 after being found by PD (for well check) on the ground unable to move x days. In ED, CXR consistent for pneumonia. IV antibiotics were started and she was admitted for further management. On 08/30/2019, patient complained of diplopia/frontal headache. 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 Of note, CTA head/neck revealed PE, confirmed on CTA chest 08/31/2019- CCM was consulted for further management of PE. She was transferred to [facility] for escalation of care. Neurosurgery was consulted and patient underwent a cerebral arteriogram with stent assisted coiling of basilar tip aneurysm by Dr. Kathyrn Sheriff. VAS [place] lower extremities revealed no presence of DVT. Given inability to be anticoagulated secondary to [facility], CCM requests placement of IVCF.  CTA chest 08/31/2019: 1. Filling defect within the proximal right upper lobe apical and posterior segmental arteries, consistent with pulmonary embolus. 2. Enlarged main pulmonary artery, consistent with pulmonary hypertension. 3. Moderate cardiomegaly. 4. Aortic Atherosclerosis (ICD10-I70.0). 5. 2.3 cm right thyroid heterogeneous nodule. In the setting of significant comorbidities or limited life expectancy, no follow-up recommended. Otherwise, consider follow-up thyroid ultrasound on a routine outpatient basis. (Ref: J Am Coll Radiol. 2015 Feb;12(2): 143-50).  IR requested by Dr. Lynetta Mare for possible image-guided IVCF placement. Patient lethargic laying in bed, ill appearing. She opens eyes to voice but quickly shuts them. Does not follow simple commands. RN and RT at  bedside. History difficulty to obtain secondary to lethargy.   Past Medical History:  Diagnosis Date  . HLD (hyperlipidemia)   . HTN (hypertension)    controlled  . Leukocytosis    ?   Marland Kitchen Neoplasm of ear    left ear lobe  . Obesity   . Wears dentures    upper    Past Surgical History:  Procedure Laterality Date  . ABDOMINAL HYSTERECTOMY  Age 89  . COLONOSCOPY  09/2004   normal, rpt 10 yrs  . EAR CYST EXCISION Left 09/27/2017   Procedure: EXCISION EAR LOBE CYST;  Surgeon: Beverly Gust, MD;  Location: [place];  Service: ENT;  Laterality: Left;  LOCAL ONLY  . OOPHORECTOMY    . RADIOLOGY WITH ANESTHESIA N/A 09/01/2019   Procedure: IR WITH ANESTHESIA;  Surgeon: Consuella Lose, MD;  Location: [place];  Service: Radiology;  Laterality: N/A;  . TOTAL VAGINAL HYSTERECTOMY  05/2001   heavy bleeding, fibroids    Allergies: Patient has no known allergies.  Medications: Prior to Admission medications   Medication Sig Start Date End Date Taking? Authorizing Provider  acetaminophen (TYLENOL) 325 MG tablet Take 2 tablets (650 mg total) by mouth every 6 (six) hours as needed for mild pain, fever or headache. 08/30/19  Yes Athena Masse, MD  amLODipine-benazepril (LOTREL) 10-20 MG capsule Take 1 capsule by mouth every evening.    Yes [provider]  Coenzyme Q10-Omega 3 Fatty Acd (HEALTHY HEART) EMUL Take 1 capsule by mouth daily.     Yes [provider]  Multiple Vitamins-Minerals (EMERGEN-C VITAMIN C PO) Take 1 tablet by mouth daily.    Yes [provider]  amLODipine (NORVASC) 10 MG tablet Take 1 tablet (10 mg total) by mouth daily. Patient not taking: Reported on 08/31/2019  08/31/19   Athena Masse, MD  benazepril (LOTENSIN) 20 MG tablet Take 1 tablet (20 mg total) by mouth daily. Patient not taking: Reported on 08/31/2019 08/31/19   Athena Masse, MD  ceFEPIme 2 g in sodium chloride 0.9 % 100 mL Inject 2 g into the vein every 8 (eight)  hours. Patient not taking: Reported on 08/31/2019 08/30/19   Athena Masse, MD  enoxaparin (LOVENOX) 40 MG/0.4ML injection Inject 0.4 mLs (40 mg total) into the skin daily. Patient not taking: Reported on 08/31/2019 08/31/19   Athena Masse, MD  niMODipine (NIMOTOP) 30 MG capsule Take 2 capsules (60 mg total) by mouth every 4 (four) hours. Patient not taking: Reported on 08/31/2019 08/30/19   Athena Masse, MD  polyethylene glycol (MIRALAX / GLYCOLAX) 17 g packet Take 17 g by mouth daily. Patient not taking: Reported on 08/31/2019 08/31/19   Athena Masse, MD  vancomycin (VANCOREADY) 1250 MG/250ML SOLN Inject 250 mLs (1,250 mg total) into the vein daily. Patient not taking: Reported on 08/31/2019 08/30/19   Athena Masse, MD     Family History  Problem Relation Age of Onset  . Ulcers Father   . Pneumonia Mother   . Diabetes Sister   . Stroke Brother   . Lupus Sister   . Stroke Sister        hemorrhage after fall  . Lupus Sister   . Rheum arthritis Sister        ?  . Diabetes Sister   . Other Sister        peritoneal dialysis    Social History   Socioeconomic History  . Marital status: Divorced    Spouse name: Not on file  . Number of children: 2  . Years of education: Not on file  . Highest education level: Not on file  Occupational History  . Occupation: Psychologist, clinical: [employer]  Tobacco Use  . Smoking status: Current Every Day Smoker    Packs/day: 1.00    Years: 40.00    Pack years: 40.00    Types: Cigarettes  . Smokeless tobacco: Current User  . Tobacco comment: Quit in 2009--restarted  Substance and Sexual Activity  . Alcohol use: No  . Drug use: No  . Sexual activity: Not on file  Other Topics Concern  . 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 Difficulty of Paying Living Expenses:   Food Insecurity:   . Worried About Charity fundraiser in the Last Year:   . Arboriculturist in the Last Year:   Transportation Needs:   . Film/video editor (Medical):   Marland Kitchen Lack of Transportation (Non-Medical):   Physical Activity:   . Days of Exercise per Week:   . Minutes of Exercise per Session:   Stress:   . Feeling of Stress :   Social Connections:   . Frequency of Communication with Friends and Family:   . Frequency of Social Gatherings with Friends and Family:   . Attends Religious Services:   . Active Member of Clubs or Organizations:   . Attends Archivist Meetings:   Marland Kitchen Marital Status:  Review of Systems: A 12 point ROS discussed and pertinent positives are indicated in the HPI above.  All other systems are negative.  Review of Systems  Reason unable to perform ROS: Lethargic.    Vital Signs: BP (!) 142/73   Pulse 80   Temp (!) 101.6 F (38.7 C) (Axillary)   Resp (!) 25   SpO2 100%   Physical Exam Vitals and nursing note reviewed.  Constitutional:      Appearance: She is ill-appearing and diaphoretic.  Cardiovascular:     Rate and Rhythm: Normal rate and regular rhythm.     Heart sounds: Normal heart sounds. No murmur.  Pulmonary:     Effort: No respiratory distress.     Breath sounds: Normal breath sounds. No wheezing.     Comments: Tachypneic. Skin:    General: Skin is warm.      MD Evaluation Airway: WNL Heart: WNL Abdomen: WNL Chest/ Lungs: WNL ASA  Classification: 3 Mallampati/Airway Score: Two   Imaging: CT ANGIO HEAD W OR WO CONTRAST  Addendum Date: 09/02/2019   ADDENDUM REPORT: 09/02/2019 07:47 ADDENDUM: These results were called by telephone at the time of interpretation on 09/02/2019 at 7:47 am to provider Dr. Kathyrn Sheriff , who verbally acknowledged these results. Electronically Signed   By: Kellie Simmering DO   On: 09/02/2019 07:47   Result  Date: 09/02/2019 CLINICAL DATA:  Neuro deficit, acute, stroke suspected. EXAM: CT ANGIOGRAPHY HEAD TECHNIQUE: Multidetector CT imaging of the head was performed using the standard protocol during bolus administration of intravenous contrast. Multiplanar CT image reconstructions and MIPs were obtained to evaluate the vascular anatomy. CONTRAST:  2mL OMNIPAQUE IOHEXOL 350 MG/ML SOLN COMPARISON:  CT head/neck 08/30/2019, head CT 08/30/2019, FINDINGS: CT HEAD Brain: Multifocal subarachnoid hemorrhage has not significantly changed in distribution or extent as compared to prior head CT examination 08/30/2019. As before, the subarachnoid hemorrhage is most notable overlying the bilateral cerebral convexities and within the posterior fossa. Redemonstrated small volume intraventricular hemorrhage within the occipital horns of the lateral ventricles. Also unchanged, there is thin subdural hemorrhage overlying the tentorium. No acute demarcated cortical infarct is identified. Redemonstrated chronic right cerebellar infarct. No evidence of hydrocephalus at this time.  No midline shift. Vascular: Reported below. Skull: Normal. Negative for fracture or focal lesion. Sinuses: Frothy secretions within the right sphenoid sinus. No significant mastoid effusion Orbits: Visualized orbits show no acute finding. CTA HEAD Anterior circulation: The intracranial internal carotid arteries are patent. Mild calcified plaque within these vessels without significant stenosis. The M1 middle cerebral arteries are patent. There is severe narrowing of the M1 right middle cerebral artery (series 14, image 21). There is severe narrowing of the superior and inferior division proximal M2 left middle cerebral arteries (series 14, image 21). There is severe narrowing of the A1 and proximal A2 anterior cerebral arteries bilaterally. Posterior circulation: There are sites of moderate/severe narrowing within the V4 right vertebral artery. The V4 left  vertebral artery is patent without significant stenosis. Apparent moderate narrowing within the mid to distal basilar artery, immediately adjacent to the stent marker (series 12, image 95). Severe narrowing of the proximal right posterior cerebral artery immediately beyond the stented portion. Severe narrowing of the stented portion of the proximal left posterior cerebral artery and beyond. Posterior communicating arteries are poorly delineated. Venous sinuses: Poorly assessed due to contrast timing. Anatomic variants: As described IMPRESSION: CT head: 1. Subarachnoid hemorrhage, subdural hemorrhage and intraventricular hemorrhage has not significantly changed in extent or distribution  as compared to head CT 08/30/2019. 2. No acute demarcated cortical infarct is identified. 3. No evidence of hydrocephalus. 4. Redemonstrated chronic right cerebellar infarct. CTA head: 1. Interval stent assisted coil embolization of a basilar tip aneurysm. 2. Multifocal severe narrowing of the proximal branch vessels of the circle-of-Willis consistent with severe basal spasm, likely with superimposed atherosclerotic disease. Most notably there is severe narrowing of the M1 right middle cerebral artery, proximal M2 left MCA branches, proximal anterior cerebral arteries and proximal posterior cerebral arteries. Additionally, there are sites of moderate/severe narrowing within the V4 right vertebral artery and focal moderate narrowing within the mid to distal basilar artery. Electronically Signed: By: Kellie Simmering DO On: 09/02/2019 07:37   CT ANGIO HEAD W OR WO CONTRAST  Addendum Date: 08/31/2019   ADDENDUM REPORT: 08/31/2019 00:58 ADDENDUM: 2.3 cm right thyroid nodule. In the setting of significant comorbidities or limited life expectancy, no follow-up recommended (ref: J Am Coll Radiol. 2015 Feb;12(2): 143-50). Electronically Signed   By: Ulyses Jarred M.D.   On: 08/31/2019 00:58   Result Date: 08/31/2019 CLINICAL DATA:   Intracranial hemorrhage EXAM: CT ANGIOGRAPHY HEAD AND NECK TECHNIQUE: Multidetector CT imaging of the head and neck was performed using the standard protocol during bolus administration of intravenous contrast. Multiplanar CT image reconstructions and MIPs were obtained to evaluate the vascular anatomy. Carotid stenosis measurements (when applicable) are obtained utilizing NASCET criteria, using the distal internal carotid diameter as the denominator. CONTRAST:  2mL OMNIPAQUE IOHEXOL 350 MG/ML SOLN COMPARISON:  None. FINDINGS: CT HEAD FINDINGS Brain: Unchanged distribution of subarachnoid hemorrhage. No midline shift or mass effect. Skull: The visualized skull base, calvarium and extracranial soft tissues are normal. Sinuses/Orbits: No fluid levels or advanced mucosal thickening of the visualized paranasal sinuses. No mastoid or middle ear effusion. The orbits are normal. CTA NECK FINDINGS SKELETON: There is no bony spinal canal stenosis. No lytic or blastic lesion. OTHER NECK: 2.3 cm right thyroid nodule. UPPER CHEST: There is an embolus within the right upper lobar pulmonary artery. AORTIC ARCH: There is mild calcific atherosclerosis of the aortic arch. There is no aneurysm, dissection or hemodynamically significant stenosis of the visualized portion of the aorta. Conventional 3 vessel aortic branching pattern. The visualized proximal subclavian arteries are widely patent. RIGHT CAROTID SYSTEM: Normal without aneurysm, dissection or stenosis. LEFT CAROTID SYSTEM: Normal without aneurysm, dissection or stenosis. VERTEBRAL ARTERIES: Codominant configuration. Both origins are clearly patent. There is no dissection, occlusion or flow-limiting stenosis to the skull base (V1-V3 segments). CTA HEAD FINDINGS POSTERIOR CIRCULATION: --Vertebral arteries: Small rate narrowing of the proximal right V4 segments --Inferior cerebellar arteries: Normal. --Basilar artery: There is a 9 x 7 mm aneurysm of the tip of the basilar  artery. --Superior cerebellar arteries: Normal. --Posterior cerebral arteries (PCA): Normal. ANTERIOR CIRCULATION: --Intracranial internal carotid arteries: Normal. --Anterior cerebral arteries (ACA): Normal. Both A1 segments are present. Patent anterior communicating artery (a-comm). --Middle cerebral arteries (MCA): Normal. VENOUS SINUSES: As permitted by contrast timing, patent. ANATOMIC VARIANTS: None Review of the MIP images confirms the above findings. IMPRESSION: 1. Unchanged distribution of subarachnoid hemorrhage. 2. 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 3. Right upper lobar pulmonary artery embolus, incompletely visualized. 4. Aortic Atherosclerosis (ICD10-I70.0). Critical Value/emergent results were called by telephone at the time of interpretation on 08/30/2019 at 10:40 pm to provider Rufina Falco , who verbally acknowledged these results. Electronically Signed: By: Ulyses Jarred M.D. On: 08/30/2019 22:41  CT HEAD WO CONTRAST  Result Date: 08/30/2019 CLINICAL DATA:  Diplopia EXAM: CT HEAD WITHOUT CONTRAST TECHNIQUE: Contiguous axial images were obtained from the base of the skull through the vertex without intravenous contrast. COMPARISON:  None. FINDINGS: Brain: There is acute subarachnoid hemorrhage over both cerebral hemispheres. Small amount of blood within the occipital horns of the lateral ventricles. There is a small amount of subarachnoid blood within the cisterna magna. There is subdural blood along the tentorium cerebelli. No midline shift or other mass effect. No intraparenchymal hemorrhage. 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 This measures approximately 5 mm. Skull: The visualized skull base, calvarium and extracranial soft tissues are normal. Sinuses/Orbits: No fluid levels or advanced mucosal thickening of the visualized paranasal sinuses. No mastoid or middle ear effusion. The orbits are normal. IMPRESSION: Aneurysmal pattern  subarachnoid hemorrhage over both convexities and extending into the ventricles and basal cisterns. 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 CTA of the head recommended for better characterization. Critical Value/emergent results were called by telephone at the time of interpretation on 08/30/2019 at 8:28 pm to provider Rufina Falco , who verbally acknowledged these results. Electronically Signed   By: Ulyses Jarred M.D.   On: 08/30/2019 20:28   CT ANGIO NECK W OR WO CONTRAST  Addendum Date: 08/31/2019   ADDENDUM REPORT: 08/31/2019 00:58 ADDENDUM: 2.3 cm right thyroid nodule. In the setting of significant comorbidities or limited life expectancy, no follow-up recommended (ref: J Am Coll Radiol. 2015 Feb;12(2): 143-50). Electronically Signed   By: Ulyses Jarred M.D.   On: 08/31/2019 00:58   Result Date: 08/31/2019 CLINICAL DATA:  Intracranial hemorrhage EXAM: CT ANGIOGRAPHY HEAD AND NECK TECHNIQUE: Multidetector CT imaging of the head and neck was performed using the standard protocol during bolus administration of intravenous contrast. Multiplanar CT image reconstructions and MIPs were obtained to evaluate the vascular anatomy. Carotid stenosis measurements (when applicable) are obtained utilizing NASCET criteria, using the distal internal carotid diameter as the denominator. CONTRAST:  13mL OMNIPAQUE IOHEXOL 350 MG/ML SOLN COMPARISON:  None. FINDINGS: CT HEAD FINDINGS Brain: Unchanged distribution of subarachnoid hemorrhage. No midline shift or mass effect. Skull: The visualized skull base, calvarium and extracranial soft tissues are normal. Sinuses/Orbits: No fluid levels or advanced mucosal thickening of the visualized paranasal sinuses. No mastoid or middle ear effusion. The orbits are normal. CTA NECK FINDINGS SKELETON: There is no bony spinal canal stenosis. No lytic or blastic lesion. OTHER NECK: 2.3 cm right thyroid nodule. UPPER CHEST: There is an embolus within the right upper lobar  pulmonary artery. AORTIC ARCH: There is mild calcific atherosclerosis of the aortic arch. There is no aneurysm, dissection or hemodynamically significant stenosis of the visualized portion of the aorta. Conventional 3 vessel aortic branching pattern. The visualized proximal subclavian arteries are widely patent. RIGHT CAROTID SYSTEM: Normal without aneurysm, dissection or stenosis. LEFT CAROTID SYSTEM: Normal without aneurysm, dissection or stenosis. VERTEBRAL ARTERIES: Codominant configuration. Both origins are clearly patent. There is no dissection, occlusion or flow-limiting stenosis to the skull base (V1-V3 segments). CTA HEAD FINDINGS POSTERIOR CIRCULATION: --Vertebral arteries: Small rate narrowing of the proximal right V4 segments --Inferior cerebellar arteries: Normal. 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 --Superior cerebellar arteries: Normal. --Posterior cerebral arteries (PCA): Normal. ANTERIOR CIRCULATION: --Intracranial internal carotid arteries: Normal. --Anterior cerebral arteries (ACA): Normal. Both A1 segments are present. Patent anterior communicating artery (a-comm). --Middle cerebral arteries (  MCA): Normal. VENOUS SINUSES: As permitted by contrast timing, patent. ANATOMIC VARIANTS: None Review of the MIP images confirms the above findings. IMPRESSION: 1. Unchanged distribution of subarachnoid hemorrhage. 2. 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 3. Right upper lobar pulmonary artery embolus, incompletely visualized. 4. Aortic Atherosclerosis (ICD10-I70.0). Critical Value/emergent results were called by telephone at the time of interpretation on 08/30/2019 at 10:40 pm to provider Rufina Falco , who verbally acknowledged these results. Electronically Signed: By: Ulyses Jarred M.D. On: 08/30/2019 22:41   CT ANGIO CHEST PE W OR WO CONTRAST  Result Date: 08/31/2019 CLINICAL DATA:  Pulmonary embolism. Subarachnoid hemorrhage.  EXAM: CT ANGIOGRAPHY CHEST WITH CONTRAST TECHNIQUE: Multidetector CT imaging of the chest was performed using the standard protocol during bolus administration of intravenous contrast. Multiplanar CT image reconstructions and MIPs were obtained to evaluate the vascular anatomy. CONTRAST:  171mL OMNIPAQUE IOHEXOL 350 MG/ML SOLN COMPARISON:  CTA head neck FINDINGS: Cardiovascular: Contrast injection is sufficient to demonstrate satisfactory opacification of the pulmonary arteries to the segmental level. There is a filling defect within the proximal right upper lobe apical and posterior segmental arteries. No other pulmonary embolus. The size of the main pulmonary artery is enlarged, measuring 3.2 cm. Moderate cardiomegaly. The course and caliber of the aorta are normal. There is mild atherosclerotic calcification. Opacification decreased due to pulmonary arterial phase contrast bolus timing. Mediastinum/Nodes: 2.3 cm right thyroid nodule, as described on earlier CTA of the head and neck. No mediastinal or axillary lymphadenopathy. Lungs/Pleura: Airways are patent. No pleural effusion, lobar consolidation, pneumothorax or pulmonary infarction. Upper Abdomen: Contrast bolus timing is not optimized for evaluation of the abdominal organs. The visualized portions of the organs of the upper abdomen are normal. Musculoskeletal: No chest wall abnormality. No bony spinal canal stenosis. Review of the MIP images confirms the above findings. IMPRESSION: 1. Filling defect within the proximal right upper lobe apical and posterior segmental arteries, consistent with pulmonary embolus. 2. Enlarged main pulmonary artery, consistent with pulmonary hypertension. 3. Moderate cardiomegaly. 4. Aortic Atherosclerosis (ICD10-I70.0). 5. 2.3 cm right thyroid heterogeneous nodule. In the setting of significant comorbidities or limited life expectancy, no follow-up recommended. Otherwise, consider follow-up thyroid ultrasound on a routine  outpatient basis. (Ref: J Am Coll Radiol. 2015 Feb;12(2): 143-50). Electronically Signed   By: Ulyses Jarred M.D.   On: 08/31/2019 03:01   DG CHEST PORT 1 VIEW  Result Date: 09/02/2019 CLINICAL DATA:  Central line placement EXAM: PORTABLE CHEST 1 VIEW COMPARISON:  None. FINDINGS: Bilateral interstitial thickening. No pleural effusion or pneumothorax. Stable cardiomegaly. Nasogastric tube coursing below the diaphragm. Left subclavian central venous catheter with the tip projecting over the SVC. IMPRESSION: Left subclavian central venous catheter with the tip projecting over the SVC. Cardiomegaly with pulmonary vascular congestion. Electronically Signed   By: Kathreen Devoid   On: 09/02/2019 12:43   DG Chest Port 1 View  Result Date: 08/28/2019 CLINICAL DATA:  Fever EXAM: PORTABLE CHEST 1 VIEW COMPARISON:  Two days ago FINDINGS: Mild linear opacity in the bilateral lung consistent with atelectasis or scarring. Cardiomegaly. No edema, effusion, or pneumothorax. IMPRESSION: Cardiomegaly and mild scarring or atelectasis. No focal consolidation or change from 2 days ago. Electronically Signed   By: Monte Fantasia M.D.   On: 08/28/2019 10:16   DG Chest Portable 1 View  Result Date: 08/26/2019 CLINICAL DATA:  Weakness. EXAM: PORTABLE CHEST 1 VIEW COMPARISON:  None. FINDINGS: Mild atelectasis and/or infiltrate is seen  within the bilateral lung bases. There is no evidence of a pleural effusion or pneumothorax. The cardiac silhouette is mildly enlarged. The visualized skeletal structures are unremarkable. IMPRESSION: Mild bibasilar atelectasis and/or infiltrate. Electronically Signed   By: Virgina Norfolk M.D.   On: 08/26/2019 17:05   ECHOCARDIOGRAM COMPLETE  Result Date: 08/29/2019    ECHOCARDIOGRAM REPORT   Patient Name:   Haley Mayo Date of Exam: 08/29/2019 Medical Rec #:  [id number]           Height:       60.0 in Accession #:    [id number]          Weight:       162.0 lb Date of Birth:  02-20-1951           BSA:          1.707 m Patient Age:    37 years            BP:           130/64 mmHg Patient Gender: F                   HR:           75 bpm. Exam Location:  [facility] Procedure: 2D Echo                                 MODIFIED REPORT:    This report was modified by Yolonda Kida MD on 08/29/2019 due to Mild                              diastolic dysfunction.  Indications:     Cardiomegaly 429.3/ I51.7  History:         Patient has no prior history of Echocardiogram examinations.  Sonographer:     Arville Go RDCS Referring Phys:  [id number] [place] Diagnosing Phys: Yolonda Kida MD  Sonographer Comments: Technically challenging study due to limited acoustic windows, Technically difficult study due to poor echo windows and suboptimal parasternal window. Image acquisition challenging due to patient body habitus. IMPRESSIONS  1. Left ventricular ejection fraction, by estimation, is 55 to 60%. The left ventricle has normal function. The left ventricle has no regional wall motion abnormalities. Left ventricular diastolic parameters are consistent with Grade I diastolic dysfunction (impaired relaxation).  2. Right ventricular systolic function is normal. The right ventricular size is normal.  3. The mitral valve is normal in structure. No evidence of mitral valve regurgitation.  4. The aortic valve is normal in structure. Aortic valve regurgitation is not visualized. Mild aortic valve sclerosis is present, with no evidence of aortic valve stenosis. FINDINGS  Left Ventricle: Left ventricular ejection fraction, by estimation, is 55 to 60%. The left ventricle has normal function. The left ventricle has no regional wall motion abnormalities. The left ventricular internal cavity size was normal in size. There is  no left ventricular hypertrophy. Left ventricular diastolic parameters are consistent with Grade I diastolic dysfunction (impaired relaxation). Right Ventricle: The right ventricular size is normal. No  increase in right ventricular wall thickness. Right ventricular systolic function is normal. Left Atrium: Left atrial size was normal in size. Right Atrium: Right atrial size was normal in size. Pericardium: The pericardium was not assessed. Mitral Valve: The mitral valve is normal in structure. No evidence of mitral valve regurgitation. Tricuspid  Valve: The tricuspid valve is normal in structure. Tricuspid valve regurgitation is mild. Aortic Valve: The aortic valve is normal in structure. Aortic valve regurgitation is not visualized. Mild aortic valve sclerosis is present, with no evidence of aortic valve stenosis. Aortic valve peak gradient measures 15.7 mmHg. Pulmonic Valve: The pulmonic valve was normal in structure. Pulmonic valve regurgitation is not visualized. Aorta: The aortic arch was not well visualized. IAS/Shunts: No atrial level shunt detected by color flow Doppler.  LEFT VENTRICLE PLAX 2D LVIDd:         4.05 cm  Diastology LVIDs:         2.90 cm  LV e' lateral:   5.33 cm/s LV PW:         1.45 cm  LV E/e' lateral: 13.3 LV IVS:        1.47 cm  LV e' medial:    4.13 cm/s LVOT diam:     2.10 cm  LV E/e' medial:  17.1 LV SV:         62 LV SV Index:   36 LVOT Area:     3.46 cm  RIGHT VENTRICLE RV Basal diam:  2.63 cm RV S prime:     14.50 cm/s LEFT ATRIUM           Index       RIGHT ATRIUM           Index LA diam:      2.90 cm 1.70 cm/m  RA Area:     12.80 cm LA Vol (A2C): 23.2 ml 13.59 ml/m RA Volume:   27.90 ml  16.35 ml/m LA Vol (A4C): 24.9 ml 14.59 ml/m  AORTIC VALVE                PULMONIC VALVE AV Area (Vmax): 1.61 cm    PV Vmax:       1.17 m/s AV Vmax:        198.00 cm/s PV Peak grad:  5.5 mmHg AV Peak Grad:   15.7 mmHg LVOT Vmax:      92.00 cm/s LVOT Vmean:     55.700 cm/s LVOT VTI:       0.178 m  AORTA Ao Root diam: 2.70 cm Ao Asc diam:  3.10 cm MITRAL VALVE MV Area (PHT): 3.85 cm    SHUNTS MV Decel Time: 197 msec    Systemic VTI:  0.18 m MV E velocity: 70.70 cm/s  Systemic Diam: 2.10 cm MV  A velocity: 81.80 cm/s MV E/A ratio:  0.86 Dwayne D Callwood MD Electronically signed by Yolonda Kida MD Signature Date/Time: 08/29/2019/4:14:02 PM    Final (Updated)    VAS [place] LOWER EXTREMITY VENOUS (DVT)  Result Date: 08/31/2019  Lower Venous DVTStudy Indications: Pulmonary embolism.  Risk Factors: Confirmed PE. Limitations: Poor ultrasound/tissue interface, body habitus and patient positioning, patient movement. Comparison Study: No prior studies. Performing Technologist: Oliver Hum RVT  Examination Guidelines: A complete evaluation includes B-mode imaging, spectral Doppler, color Doppler, and power Doppler as needed of all accessible portions of each vessel. Bilateral testing is considered an integral part of a complete examination. Limited examinations for reoccurring indications may be performed as noted. The reflux portion of the exam is performed with the patient in reverse Trendelenburg.  +---------+---------------+---------+-----------+----------+--------------+ RIGHT    CompressibilityPhasicitySpontaneityPropertiesThrombus Aging +---------+---------------+---------+-----------+----------+--------------+ CFV      Full           Yes      Yes                                 +---------+---------------+---------+-----------+----------+--------------+  SFJ      Full                                                        +---------+---------------+---------+-----------+----------+--------------+ FV Prox  Full                                                        +---------+---------------+---------+-----------+----------+--------------+ FV Mid   Full                                                        +---------+---------------+---------+-----------+----------+--------------+ FV DistalFull                                                        +---------+---------------+---------+-----------+----------+--------------+ PFV      Full                                                         +---------+---------------+---------+-----------+----------+--------------+ POP      Full           Yes      Yes                                 +---------+---------------+---------+-----------+----------+--------------+ PTV      Full                                                        +---------+---------------+---------+-----------+----------+--------------+ PERO     Full                                                        +---------+---------------+---------+-----------+----------+--------------+   +---------+---------------+---------+-----------+----------+--------------+ LEFT     CompressibilityPhasicitySpontaneityPropertiesThrombus Aging +---------+---------------+---------+-----------+----------+--------------+ CFV      Full           Yes      Yes                                 +---------+---------------+---------+-----------+----------+--------------+ SFJ      Full                                                        +---------+---------------+---------+-----------+----------+--------------+  FV Prox  Full                                                        +---------+---------------+---------+-----------+----------+--------------+ FV Mid   Full                                                        +---------+---------------+---------+-----------+----------+--------------+ FV DistalFull                                                        +---------+---------------+---------+-----------+----------+--------------+ PFV      Full                                                        +---------+---------------+---------+-----------+----------+--------------+ POP      Full           Yes      Yes                                 +---------+---------------+---------+-----------+----------+--------------+ PTV      Full                                                         +---------+---------------+---------+-----------+----------+--------------+ PERO     Full                                                        +---------+---------------+---------+-----------+----------+--------------+     Summary: RIGHT: - There is no evidence of deep vein thrombosis in the lower extremity. However, portions of this examination were limited- see technologist comments above.  - No cystic structure found in the popliteal fossa.  LEFT: - There is no evidence of deep vein thrombosis in the lower extremity. However, portions of this examination were limited- see technologist comments above.  - No cystic structure found in the popliteal fossa.  *See table(s) above for measurements and observations. Electronically signed by Monica Martinez MD on 08/31/2019 at 5:42:20 PM.    Final    VAS [place] TRANSCRANIAL DOPPLER  Result Date: 09/02/2019  Transcranial Doppler Indications: Subarachnoid hemorrhage. History: Basilar artery aneurysm. History of hypertension, hyperlipidemia and smoking. Limitations for diagnostic windows: Unable to insonate right transtemporal window. Unable to insonate left transtemporal window. Performing Technologist: Oda Cogan RDMS, RVT  Examination Guidelines: A complete evaluation includes B-mode imaging, spectral Doppler, color Doppler, and power Doppler as needed of all accessible portions of each vessel. Bilateral testing  is considered an integral part of a complete examination. Limited examinations for reoccurring indications may be performed as noted.  +----------+-------------+----------+-----------+---------+ RIGHT TCD Right VM (cm)Depth (cm)Pulsatility Comment  +----------+-------------+----------+-----------+---------+ MCA                                         no window +----------+-------------+----------+-----------+---------+ ACA                                         no window +----------+-------------+----------+-----------+---------+  Term ICA                                    no window +----------+-------------+----------+-----------+---------+ PCA                                         no window +----------+-------------+----------+-----------+---------+ Opthalmic     65.00                  2.2              +----------+-------------+----------+-----------+---------+ ICA siphon    88.00                  101              +----------+-------------+----------+-----------+---------+ Vertebral    -55.00                                   +----------+-------------+----------+-----------+---------+  +----------+------------+----------+-----------+---------+ LEFT TCD  Left VM (cm)Depth (cm)Pulsatility Comment  +----------+------------+----------+-----------+---------+ MCA                                        no window +----------+------------+----------+-----------+---------+ ACA                                        no window +----------+------------+----------+-----------+---------+ Term ICA                                   no window +----------+------------+----------+-----------+---------+ PCA                                        no window +----------+------------+----------+-----------+---------+ Opthalmic    43.00                  2.2              +----------+------------+----------+-----------+---------+ ICA siphon   103.00     65.00       1.5              +----------+------------+----------+-----------+---------+ Vertebral    -31.00                                  +----------+------------+----------+-----------+---------+  +------------+-------+-------------+  VM cm/s   Comment    +------------+-------+-------------+ Prox Basilar-84.00               +------------+-------+-------------+ Dist Basilar       Not insonated +------------+-------+-------------+    Preliminary    VAS [place] TRANSCRANIAL DOPPLER  Result Date: 09/02/2019   Transcranial Doppler Indications: Subarachnoid hemorrhage. Limitations: Patient movement Limitations for diagnostic windows: Unable to insonate right transtemporal window. Unable to insonate left transtemporal window. Comparison Study: No prior studies. Performing Technologist: Oliver Hum RVT  Examination Guidelines: A complete evaluation includes B-mode imaging, spectral Doppler, color Doppler, and power Doppler as needed of all accessible portions of each vessel. Bilateral testing is considered an integral part of a complete examination. Limited examinations for reoccurring indications may be performed as noted.  +----------+-------------+----------+-----------+-------+ RIGHT TCD Right VM (cm)Depth (cm)PulsatilityComment +----------+-------------+----------+-----------+-------+ Opthalmic     12.00                 1.48            +----------+-------------+----------+-----------+-------+ ICA siphon    32.00                 2.06            +----------+-------------+----------+-----------+-------+ Vertebral    -43.00                 1.57            +----------+-------------+----------+-----------+-------+  +----------+------------+----------+-----------+-------+ LEFT TCD  Left VM (cm)Depth (cm)PulsatilityComment +----------+------------+----------+-----------+-------+ Opthalmic    25.00                 1.88            +----------+------------+----------+-----------+-------+ ICA siphon   55.00                 1.64            +----------+------------+----------+-----------+-------+ Vertebral    -29.00                1.29            +----------+------------+----------+-----------+-------+  Summary:  Limited study duwe to poor windows throughout. antegrade flow noted in both opthalmics, carotid siphons and vertebral arties only. No denite evidence of vasospasm noted in visualized vessels. *See table(s) above for TCD measurements and observations.  Diagnosing physician:  Antony Contras MD Electronically signed by Antony Contras MD on 09/02/2019 at 1:03:04 PM.    Final    US ABDOMEN LIMITED RUQ  Result Date: 08/26/2019 CLINICAL DATA:  Elevated LFTs EXAM: ULTRASOUND ABDOMEN LIMITED RIGHT UPPER QUADRANT COMPARISON:  None FINDINGS: Gallbladder: No gallstones or wall thickening visualized. No sonographic Murphy sign noted by sonographer. Common bile duct: Diameter: 2.8 mm, nondilated Liver: Diffusely increased hepatic echogenicity with loss of definition of the portal triads and diminished posterior through transmission compatible with hepatic steatosis. Geographic region of hypoattenuation along the gallbladder fossa is compatible with focal fatty sparing. Anechoic 1.6 x 1.3 x 1 cm cyst in the left lobe liver. Additional 1 x 1.1 x 1 cm cyst in the right lobe liver. No concerning focal lesion. Portal vein is patent on color Doppler imaging with normal direction of blood flow towards the liver. Other: None. IMPRESSION: Diffusely increased hepatic echogenicity most compatible with hepatic steatosis with focal fatty sparing along the gallbladder fossa. Small simple appearing hepatic cysts. No other acute or concerning right upper quadrant abnormality. Electronically Signed   By: Lovena Le M.D.   On: 08/26/2019 18:40  Labs:  CBC: Recent Labs    08/29/19 0439 08/30/19 0702 08/31/19 0321 09/02/19 0946  WBC 18.2* 20.4* 21.5* 18.5*  HGB 13.8 13.9 13.7 10.9*  HCT 39.6 41.5 41.8 33.3*  PLT 251 266 283 310    COAGS: Recent Labs    08/31/19 0321  INR 1.1  APTT 32    BMP: Recent Labs    08/28/19 0510 08/28/19 0510 08/29/19 0439 08/29/19 1857 08/30/19 0702 08/31/19 0321 09/02/19 0946  NA 142  --  139  --  139 135  --   K 2.6*   < > 2.8* 4.3 3.2* 3.4*  --   CL 109  --  106  --  104 103  --   CO2 24  --  24  --  25 24  --   GLUCOSE 132*  --  151*  --  147* 199*  --   BUN 9  --  9  --  10 10  --   CALCIUM 8.2*  --  8.3*  --  8.5* 8.8*  --   CREATININE 0.49    < > 0.49  --  0.41* 0.70 0.62  GFRNONAA >60   < > >60  --  >60 >60 >60  GFRAA >60   < > >60  --  >60 >60 >60   < > = values in this interval not displayed.    LIVER FUNCTION TESTS: Recent Labs    08/26/19 1638 08/27/19 0453 08/28/19 0510 08/30/19 0702  BILITOT 1.3* 0.7 0.9 0.7  AST 99* 47* 30 21  ALT 71* 53* 45* 39  ALKPHOS 77 69 63 68  PROT 8.4* 7.0 6.8 7.1  ALBUMIN 4.0 3.3* 3.1* 3.1*     Assessment and Plan:  History of PE, unable to be anticoagulated secondary to [facility]. Plan for image-guided IVCF placement tentatively for today in IR.  Risks and benefits discussed with the patient including, but not limited to bleeding, infection, contrast induced renal failure, filter fracture or migration which can lead to emergency surgery or even death, strut penetration with damage or irritation to adjacent structures and caval thrombosis. All of the patient's son's questions were answered, patient is agreeable to proceed. Consent obtained by patient's son, Durenda Guthrie, via telephone- signed and in IR control room.   Thank you for this interesting consult.  I greatly enjoyed meeting Haley Mayo and look forward to participating in their care.  A copy of this report was sent to the requesting provider on this date.  Electronically Signed: Earley Abide, PA-C 09/02/2019, 3:58 PM   I spent a total of 40 Minutes in face to face in clinical consultation, greater than 50% of which was counseling/coordinating care for PE/IVCF placement.

## 2019-09-02 NOTE — Sedation Documentation (Signed)
Pt arrived to IR suite. Pt does follow some commands, pt is very drowsy. Pt on multiple bp drips that are being titrated so SWOT RN is at bedside. Pt does not appear to be in any pain at this time. Vitals are stable though she is hypertensive. MD aware. Pt moved to procedure table with maximum assistance, being draped and prepped for procedure at this time. Will continue to monitor.

## 2019-09-02 NOTE — Progress Notes (Signed)
EEG complete - results pending 

## 2019-09-02 NOTE — Progress Notes (Signed)
Transcranial Doppler  Date POD PCO2 HCT BP  MCA ACA PCA OPHT SIPH VERT Basilar  5/17 GC     Right  Left                  12  25   32  55   -43  -29         5/19,rs     Right  Left   *  *   *  *   *  *   65  43   88  103   -55  -31   -84           Right  Left                                             Right  Left                                             Right  Left                                            Right  Left                                            Right  Left                                        MCA = Middle Cerebral Artery      OPHT = Opthalmic Artery     BASILAR = Basilar Artery   ACA = Anterior Cerebral Artery     SIPH = Carotid Siphon PCA = Posterior Cerebral Artery   VERT = Verterbral Artery                   Normal MCA = 62+\-12 ACA = 50+\-12 PCA = 42+\-23   (*) no window  - not insonated. Velva Harman Raaga Maeder, BS, RDMS, RVT

## 2019-09-02 NOTE — Procedures (Signed)
Central Venous Catheter Insertion Procedure Note Haley Mayo [id number] [birth year]  Procedure: Insertion of Central Venous Catheter Indications: Assessment of intravascular volume and Drug and/or fluid administration  Procedure Details Consent: Risks of procedure as well as the alternatives and risks of each were explained to the (patient/caregiver).  Consent for procedure obtained. Time Out: Verified patient identification, verified procedure, site/side was marked, verified correct patient position, special equipment/implants available, medications/allergies/relevent history reviewed, required imaging and test results available.  Performed  Maximum sterile technique was used including antiseptics, cap, gloves, gown, hand hygiene, mask and sheet. Skin prep: Chlorhexidine; local anesthetic administered A antimicrobial bonded/coated triple lumen catheter was placed in the left subclavian vein using the Seldinger technique.  21F 20cm catheter inserted to 18cm. 2 passes required. Ultrasound guidance used.      Evaluation Blood flow good Complications: No apparent complications Patient did tolerate procedure well. Chest X-ray ordered to verify placement.  CXR: normal.  Jeremia Groot 09/02/2019, 1:01 PM

## 2019-09-02 NOTE — Progress Notes (Signed)
  NEUROSURGERY PROGRESS NOTE   Pt doing well until early this am, found to be more lethargic, not following commands. CTA done.  EXAM:  BP (!) 120/50   Pulse 82   Temp 99.5 F (37.5 C) (Oral)   Resp 20   SpO2 94%   Arouses to stimulation Groans to pain but does not answer questions ?intermittently FC W/d BUE/BLE to pain  IMAGING: CT/CTA reviewed demonstrating continued severe spasm of bilateral A1/A2, M1/M2, and basilar arteries. Stent construct appears patent with spasm involving PCA as well. No new hemorrhage or HCP.  IMPRESSION:  69 y.o. female subacute SAH, likely d# 7 with fusiform basilar aneurysm s/p stent-coiling and symptomatic vasospasm PE on CTA chest without LE DVT - ?related to concurrent pneumonia  PLAN: - Will begin hyperdynamic therapy including IVF and vasopressor support. - Cont DAPT, with absence of DVT she does not appear to need a IVC filter and would not anticoagulate in addition to DAPT. - Cont Nimotop and TCD monitoring  I have reviewed the imaging personally and discussed with radiology. I have also reviewed the case with PCCM regarding the plan above and need for pressor support with likely need for central venous access and arterial line monitoring.

## 2019-09-02 NOTE — Progress Notes (Signed)
PT Cancellation Note  Patient Details Name: Haley Mayo MRN: [id number] DOB: 11-04-1950   Cancelled Treatment:    Reason Eval/Treat Not Completed: Patient not medically ready. Per RN, pt currently vasospasming. Acute PT to return as able, as appropriate.  [name], PT, DPT Acute Rehabilitation Services Pager #: [phone] Office #: [phone]    [name] 09/02/2019, 8:33 AM

## 2019-09-02 NOTE — Procedures (Signed)
Cortrak  Person Inserting Tube:  King, Felita Bump E, RD Tube Type:  Cortrak - 43 inches Tube Location:  Left nare Initial Placement:  Stomach Secured by: Bridle Technique Used to Measure Tube Placement:  Documented cm marking at nare/ corner of mouth Cortrak Secured At:  66 cm    Cortrak Tube Team Note:  Consult received to place a Cortrak feeding tube.   No x-ray is required. RN may begin using tube.   If the tube becomes dislodged please keep the tube and contact the Cortrak team at [email or web address] ([email or web address]) for replacement.  If after hours and replacement cannot be delayed, place a NG tube and confirm placement with an abdominal x-ray.   Letizia Hook King, MS, RD, LDN Pager number available on Amion 

## 2019-09-02 NOTE — Sedation Documentation (Signed)
Pt tolerating procedure very well. No sedation given at this time. Vitals remain stable.

## 2019-09-03 ENCOUNTER — Inpatient Hospital Stay (HOSPITAL_COMMUNITY): Payer: Medicare Other

## 2019-09-03 DIAGNOSIS — I2602 Saddle embolus of pulmonary artery with acute cor pulmonale: Secondary | ICD-10-CM

## 2019-09-03 LAB — BASIC METABOLIC PANEL
Anion gap: 9 (ref 5–15)
Anion gap: 12 (ref 5–15)
BUN: 9 mg/dL (ref 8–23)
BUN: 7 mg/dL — ABNORMAL LOW (ref 8–23)
CO2: 25 mmol/L (ref 22–32)
CO2: 25 mmol/L (ref 22–32)
Calcium: 7.6 mg/dL — ABNORMAL LOW (ref 8.9–10.3)
Calcium: 7.7 mg/dL — ABNORMAL LOW (ref 8.9–10.3)
Chloride: 108 mmol/L (ref 98–111)
Chloride: 103 mmol/L (ref 98–111)
Creatinine, Ser: 0.46 mg/dL (ref 0.44–1.00)
Creatinine, Ser: 0.4 mg/dL — ABNORMAL LOW (ref 0.44–1.00)
GFR calc Af Amer: >60 mL/min (ref >60)
GFR calc Af Amer: >60 mL/min (ref >60)
GFR calc non Af Amer: >60 mL/min (ref >60)
GFR calc non Af Amer: >60 mL/min (ref >60)
Glucose, Bld: 193 mg/dL — ABNORMAL HIGH (ref 70–99)
Glucose, Bld: 206 mg/dL — ABNORMAL HIGH (ref 70–99)
Potassium: 2.2 mmol/L — CL (ref 3.5–5.1)
Potassium: 3.1 mmol/L — ABNORMAL LOW (ref 3.5–5.1)
Sodium: 142 mmol/L (ref 135–145)
Sodium: 140 mmol/L (ref 135–145)

## 2019-09-03 LAB — MAGNESIUM
Magnesium: 1.7 mg/dL (ref 1.7–2.4)
Magnesium: 2.5 mg/dL — ABNORMAL HIGH (ref 1.7–2.4)

## 2019-09-03 LAB — GLUCOSE, CAPILLARY
Glucose-Capillary: 191 mg/dL — ABNORMAL HIGH (ref 70–99)
Glucose-Capillary: 164 mg/dL — ABNORMAL HIGH (ref 70–99)
Glucose-Capillary: 192 mg/dL — ABNORMAL HIGH (ref 70–99)
Glucose-Capillary: 176 mg/dL — ABNORMAL HIGH (ref 70–99)
Glucose-Capillary: 132 mg/dL — ABNORMAL HIGH (ref 70–99)
Glucose-Capillary: 176 mg/dL — ABNORMAL HIGH (ref 70–99)

## 2019-09-03 LAB — HEMOGLOBIN A1C
Hgb A1c MFr Bld: 6.7 % — ABNORMAL HIGH (ref 4.8–5.6)
Mean Plasma Glucose: 145.59 mg/dL

## 2019-09-03 LAB — PHOSPHORUS
Phosphorus: 1.6 mg/dL — ABNORMAL LOW (ref 2.5–4.6)
Phosphorus: 3.1 mg/dL (ref 2.5–4.6)

## 2019-09-03 LAB — ECHOCARDIOGRAM COMPLETE: Weight: 2574.97 oz

## 2019-09-03 MED ORDER — CHLORHEXIDINE GLUCONATE 0.12 % MT SOLN
15.0000 mL | Freq: Two times a day (BID) | OROMUCOSAL | Status: DC
  Administered 2019-09-03 – 2019-09-10 (×14): 15 mL via OROMUCOSAL
  Filled 2019-09-03 (×9): qty 15

## 2019-09-03 MED ORDER — POTASSIUM CHLORIDE 20 MEQ PO PACK: 40.0000 meq | PACK | ORAL | Status: DC

## 2019-09-03 MED ORDER — MAGNESIUM SULFATE 4 GM/100ML IV SOLN
4.0000 g | Freq: Once | INTRAVENOUS | Status: AC
  Administered 2019-09-03: 4 g via INTRAVENOUS
  Filled 2019-09-03: qty 100

## 2019-09-03 MED ORDER — POTASSIUM CHLORIDE 20 MEQ PO PACK
40.0000 meq | PACK | ORAL | Status: AC
  Administered 2019-09-03 (×2): 40 meq
  Filled 2019-09-03 (×2): qty 2

## 2019-09-03 MED ORDER — ORAL CARE MOUTH RINSE
15.0000 mL | Freq: Two times a day (BID) | OROMUCOSAL | Status: DC
  Administered 2019-09-03 – 2019-09-09 (×13): 15 mL via OROMUCOSAL

## 2019-09-03 MED ORDER — PHENYLEPHRINE CONCENTRATED 100MG/250ML (0.4 MG/ML) INFUSION SIMPLE
0.0000 ug/min | INTRAVENOUS | Status: DC
  Administered 2019-09-03: 100 ug/min via INTRAVENOUS
  Administered 2019-09-03: 380 ug/min via INTRAVENOUS
  Administered 2019-09-04: 190 ug/min via INTRAVENOUS
  Administered 2019-09-05: 80 ug/min via INTRAVENOUS
  Filled 2019-09-03 (×8): qty 250

## 2019-09-03 MED ORDER — POTASSIUM CHLORIDE IN NACL 20-0.9 MEQ/L-% IV SOLN
INTRAVENOUS | Status: DC
  Filled 2019-09-03: qty 1000
  Filled 2019-09-03: qty 2000
  Filled 2019-09-03 (×8): qty 1000

## 2019-09-03 MED ORDER — INSULIN ASPART 100 UNIT/ML ~~LOC~~ SOLN
0.0000 [IU] | SUBCUTANEOUS | Status: DC
  Administered 2019-09-03: 2 [IU] via SUBCUTANEOUS
  Administered 2019-09-03: 1 [IU] via SUBCUTANEOUS
  Administered 2019-09-03 – 2019-09-04 (×3): 2 [IU] via SUBCUTANEOUS
  Administered 2019-09-04: 1 [IU] via SUBCUTANEOUS
  Administered 2019-09-04 (×2): 2 [IU] via SUBCUTANEOUS
  Administered 2019-09-05: 1 [IU] via SUBCUTANEOUS
  Administered 2019-09-05 (×3): 2 [IU] via SUBCUTANEOUS
  Administered 2019-09-05: 1 [IU] via SUBCUTANEOUS
  Administered 2019-09-05: 2 [IU] via SUBCUTANEOUS
  Administered 2019-09-06: 1 [IU] via SUBCUTANEOUS
  Administered 2019-09-06: 3 [IU] via SUBCUTANEOUS
  Administered 2019-09-06: 2 [IU] via SUBCUTANEOUS
  Administered 2019-09-06 (×2): 1 [IU] via SUBCUTANEOUS
  Administered 2019-09-06: 2 [IU] via SUBCUTANEOUS
  Administered 2019-09-07: 1 [IU] via SUBCUTANEOUS
  Administered 2019-09-07: 2 [IU] via SUBCUTANEOUS
  Administered 2019-09-07 (×2): 1 [IU] via SUBCUTANEOUS
  Administered 2019-09-08: 3 [IU] via SUBCUTANEOUS
  Administered 2019-09-08: 2 [IU] via SUBCUTANEOUS
  Administered 2019-09-09 (×3): 1 [IU] via SUBCUTANEOUS
  Administered 2019-09-09: 2 [IU] via SUBCUTANEOUS
  Administered 2019-09-09 – 2019-09-10 (×4): 1 [IU] via SUBCUTANEOUS

## 2019-09-03 MED ORDER — POTASSIUM PHOSPHATES 15 MMOLE/5ML IV SOLN
30.0000 mmol | Freq: Once | INTRAVENOUS | Status: AC
  Administered 2019-09-03: 30 mmol via INTRAVENOUS
  Filled 2019-09-03: qty 10

## 2019-09-03 MED ORDER — INSULIN ASPART 100 UNIT/ML ~~LOC~~ SOLN: 0.0000 [IU] | Freq: Three times a day (TID) | SUBCUTANEOUS | Status: DC

## 2019-09-03 MED ORDER — PANTOPRAZOLE SODIUM 40 MG PO PACK
40.0000 mg | PACK | Freq: Every day | ORAL | Status: DC
  Administered 2019-09-03 – 2019-09-05 (×3): 40 mg
  Filled 2019-09-03 (×3): qty 20

## 2019-09-03 NOTE — Consult Note (Signed)
NAME:  Haley Mayo, MRN:  [id number], DOB:  1951/03/13, LOS: 3 ADMISSION DATE:  08/31/2019, CONSULTATION DATE:  09/01/2019 REFERRING MD:  Dr. Kathyrn Sheriff, CHIEF COMPLAINT:  SAH/ PE  Brief History   69 year old female initially admitted at [facility] for CAP on 5/12 who developed headache and diplopia found to have a [place].  Transferred to [facility] for further neurosurgical care.  Incidently on CTA found to have PE.    History of present illness    69 year old female with prior history of hypertension and tobacco abuse who presented initially to  [facility] on 5/12 after her son sent police for a welfare check.  Patient lives alone, is retired, and does not go out much.  No local family members.  Has two children, a son who lives in [place] and daughter who lives in [place].  Reports that she has some chronic shortness of breath.  She is very vague in her description and can not pinpoint how long ago it started, but thinks it has been several months. Denies chest pain, recent travel, sick exposures, weight loss, leg swelling or pain, or known family history of blood clots other than family history of strokes.  States she received her first dose of the [place] vaccine on 5/4 and started to feel bad three days later with progressive generalized weakness,  poor PO intake, and episode of nausea/ vomiting.  She was found on the floor.  She was noted to be initially hypertensive, hypoxic, with a leukocytosis and initial CXR concerning for bibasilar infiltrates vs atelectasis.  She was admitted and empiric CAP therapy. On 5/16, she developed fever and complained of a headache with diplopia.  CT head obtained which showed a subarachnoid hemorrhage over both convexities and extending into the ventricles and basal cisterns likely arising from a 5 mm basilar tip aneurysm.  CTA confirmed a 9x7 mm aneurysm of the tip of the basilar artery and incidentally showing a right upper lobe PE incompletely visualized.  She was then  transferred to [facility], Neurosurgery admitting. Plans for a stent-coiling today in Neuro IR.  PCCM consulted for further recommendations given pulmonary embolus.   Past Medical History  HTN. 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 5/19 - started hemodynamic 3 vessel vasospasm.  5/19 - IVC filter placement  Significant Diagnostic Tests:   5/16 CTH >>  Aneurysmal pattern subarachnoid hemorrhage over both convexities and extending into the ventricles and basal cisterns. 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 CTA of the head recommended for better characterization.  5/16 CTA head/ neck >> 1. Unchanged distribution of subarachnoid hemorrhage. 2. 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 3. Right upper lobar pulmonary artery embolus, incompletely visualized. 4. Aortic Atherosclerosis  2.3 cm right thyroid nodule  5/16 CTA PE >> 1. Filling defect within the proximal right upper lobe apical and posterior segmental arteries, consistent with pulmonary embolus. 2. Enlarged main pulmonary artery, consistent with pulmonary hypertension. 3. Moderate cardiomegaly. 4. Aortic Atherosclerosis (ICD10-I70.0). 5. 2.3 cm right thyroid heterogeneous nodule.   5/17 TCDs 5/17 b/l LE doppler study >> limited study, no evidence of DVT   5/18 CTA: continued severe spasm of bilateral A1/A2, M1/M2, and basilar arteries. Stent construct appears patent with spasm involving PCA as well. No new hemorrhage or HCP  Micro  Data:  5/12 SARS 2 >> neg 5/14 UC >> neg 5/14 BC [facility]) >>  5/16 BCx 2 >> ngtd  Antimicrobials:  5/12 azithro >> 5/16 5/12 ceftriaxone >> 5/15 5/16 cefepime >> 5/17 5/16 vanc  Interim history/subjective:  Still somnolent but now following commands predictably in all four extremities  Objective   Blood pressure (!) 153/65, pulse  79, temperature 99 F (37.2 C), temperature source Oral, resp. rate 20, weight 73 kg, SpO2 (!) 87 %. CVP:  [8 mmHg-16 mmHg] 12 mmHg      Intake/Output Summary (Last 24 hours) at 09/03/2019 1052 Last data filed at 09/03/2019 1009 Gross per 24 hour  Intake 8309.12 ml  Output 6175 ml  Net 2134.12 ml   Filed Weights   09/03/19 0500  Weight: 73 kg    Examination: General:  Older female lying in bed, somnolent. HEENT: MM pink/moist, pupils 3/reactive, slowed response Neuro: Follows 2 step commands, moves all extremities with equal strength. No verbal response to questioning. CV: rr, no murmur PULM:  Non labored, CTA, remains on 1L [place] GI: obese, soft, bs active  Extremities: warm/dry, no LE edema, no calf warmth/ tenderness Skin: no rashes   I performed a POC echocardiogram 5/19 TDS with limited acoustic windows. LV normal size and function with EF measured at 56%. Grade 1 diastolic dysfunction. Mild MR, trace TR. AV mildly calcified with normal excursion. RV free wall appears thickened with normal cavity size. RVSP estimated 30-35.  IVC distended with increased respiratory variation during exaggerated breathing.   Resolved Hospital Problem list    Assessment & Plan:   Critically ill due to cerebral vasospasm from aneurysmal SAH requiring initiation of vasopressors and fluid resuscitation  - Target hemodynamic augmentation to keep MAP 110-115 -CVP monitoring to maintain euvolemia.  At risk for cerebral salt wasting.  - Follow TCD - Enteral access for Nimodipine   RUL PE due to immobility following SAH. No clearly associated pneumonia.  - very sedentary at baseline, hx of tobacco abuse, and obesity are risk factors for provoked PE   - currently hemodynamically stable on 1L [place] - wean supplemental O2 for sat goal > 92% - Retrieve filter once able to anticoagulate and plan to treat for 3 months.  SAH with fusiform basilar aneurysm - status post coil and stent placement. - Continue  DAPT for stent  HTN  - hold all antihypertensive therapy for now.  Leukocytosis  - reactive and improving. - s/p treatment for CAP - remains afebrile, monitor   Incidental right thyroid nodule  - consider outpatient ultrasound for f/u   Hypokalemia - potassium supplementation and add K to IV fluids.   Daily Goals Checklist  Pain/Anxiety/Delirium protocol (if indicated): prn only Neuro vitals: every 1 hours AED's: none VAP protocol (if indicated): not intubated Respiratory support goals: HOB 45  Blood pressure target: MAP 110-115 as significant whip to arterial line makes Systolic less reliable.  DVT prophylaxis: heparin tid Nutrition Status: initiated tube feeds GI prophylaxis: protonix Fluid status goals: CVP monitoring, keep euvolemic Urinary catheter: Guide hemodynamic management Central lines: left [place] triple lumen Glucose control: SSI insulin.  Mobility/therapy needs: bedrest  Antibiotic de-escalation: complete 7 days for CAP vs Aspiration  Home medication reconciliation: on hold Daily labs: BMET bid, CBC daily Code Status: full Family Communication:  Updated son 5/19 Disposition: ICU   Labs   CBC: Recent Labs  Lab 08/28/19 0510 08/29/19 0439 08/30/19 0702 08/31/19 0321 09/02/19 0946  WBC 19.1* 18.2* 20.4* 21.5* 18.5*  HGB 13.5 13.8 13.9 13.7 10.9*  HCT 40.5 39.6 41.5 41.8 33.3*  MCV 82.0 80.8 81.4 83.6 84.1  PLT 262 251 266 283 [id number]    Basic Metabolic Panel: Recent Labs  Lab 08/29/19 0439 08/29/19 0439 08/29/19 1857 08/30/19 0702 08/31/19 0321 09/02/19 0946 09/02/19 1730 09/03/19 0650  NA 139  --   --  139 135  --  140 142  K 2.8*   < > 4.3 3.2* 3.4*  --  2.0* 2.2*  CL 106  --   --  104 103  --  105 108  CO2 24  --   --  25 24  --  24 25  GLUCOSE 151*  --   --  147* 199*  --  228* 193*  BUN 9  --   --  10 10  --  8 9  CREATININE 0.49   < >  --  0.41* 0.70 0.62 0.55 0.46  CALCIUM 8.3*  --   --  8.5* 8.8*  --  7.7* 7.6*  MG  --   --   --    --   --  1.5* 2.3 1.7  PHOS  --   --   --   --   --  2.2* 1.8* 1.6*   < > = values in this interval not displayed.   GFR: Estimated Creatinine Clearance: 60.9 mL/min (by C-G formula based on SCr of 0.46 mg/dL). Recent Labs  Lab 08/29/19 0439 08/30/19 0702 08/31/19 0321 09/02/19 0946  WBC 18.2* 20.4* 21.5* 18.5*    Liver Function Tests: Recent Labs  Lab 08/28/19 0510 08/30/19 0702  AST 30 21  ALT 45* 39  ALKPHOS 63 68  BILITOT 0.9 0.7  PROT 6.8 7.1  ALBUMIN 3.1* 3.1*   No results for input(s): LIPASE, AMYLASE in the last 168 hours. No results for input(s): AMMONIA in the last 168 hours.  ABG No results found for: PHART, PCO2ART, PO2ART, HCO3, TCO2, ACIDBASEDEF, O2SAT   Coagulation Profile: Recent Labs  Lab 08/31/19 0321  INR 1.1    Cardiac Enzymes: Recent Labs  Lab 08/28/19 0510 08/29/19 0439  CKTOTAL 711* 366*    HbA1C: Hgb A1c MFr Bld  Date/Time Value Ref Range Status  09/02/2019 09:46 AM 6.7 (H) 4.8 - 5.6 % Final    Comment:    (NOTE) Pre diabetes:          5.7%-6.4% Diabetes:              >6.4% Glycemic control for   <7.0% adults with diabetes   01/25/2016 10:20 AM 6.4 (H) 4.8 - 5.6 % Final    Comment:             Pre-diabetes: 5.7 - 6.4          Diabetes: >6.4          Glycemic control for adults with diabetes: <7.0     CBG: Recent Labs  Lab 09/02/19 1558 09/02/19 1946 09/02/19 2343 09/03/19 0319 09/03/19 0756  GLUCAP 260* 193* 168* 191* 164*   CRITICAL CARE Performed by: Kipp Brood   Total critical care time: 35 minutes  Critical care time was exclusive of separately billable procedures and treating other patients.  Critical care was necessary to treat or prevent imminent or life-threatening deterioration.  Critical care was time spent personally by me on the following activities: development of treatment plan with patient and/or surrogate as well as nursing, discussions with consultants, evaluation of patient's response  to treatment, examination of patient, obtaining history from patient or surrogate, ordering and performing treatments and interventions, ordering and review of laboratory studies, ordering and review of radiographic studies, pulse oximetry, re-evaluation of patient's condition and participation in multidisciplinary rounds.  Kipp Brood, MD [facility] ICU Physician [place]  Pager: [phone] Mobile: [phone] After hours: [phone].   09/03/2019, 10:52 AM

## 2019-09-03 NOTE — Progress Notes (Signed)
SLP Cancellation Note  Patient Details Name: Haley Mayo MRN: [id number] DOB: [birth year]   Cancelled treatment:       Reason Eval/Treat Not Completed: Medical issues which prohibited therapy. Discussed with RN. Note that pt has [place] - SLP does not currently have orders for swallowing (cognition only). Per RN she is not yet ready for swallow evaluation but may need one. Please considering ordering when ready.    Osie Bond., M.A. [place] [facility] Pager [phone] Office [phone]  09/03/2019, 9:40 AM

## 2019-09-03 NOTE — Progress Notes (Signed)
Inpatient Rehab Admissions Coordinator Note:   Per PT/OT recommendations, pt was screened for CIR candidacy by Shann Medal, PT, DPT.  At this time we are recommending CIR consult.  I will place an order per our protocol.  Please contact me with questions.   Shann Medal, PT, DPT [phone] 09/03/19 4:04 PM

## 2019-09-03 NOTE — Progress Notes (Signed)
  NEUROSURGERY PROGRESS NOTE   [place] started yesterday due to symptomatic vasospasm.  IVC filter placed by IR due to PE Patient more alert this am  EXAM:  BP (!) 145/62   Pulse 75   Temp 98.8 F (37.1 C) (Axillary)   Resp 18   Wt 73 kg   SpO2 97%   BMI 31.43 kg/m   Drowsy but easily awakened Speech slow, but answering appropriately Follows commands CN grossly intact MAEW, mild right quad weakness but appears pain mediated. Access site: c/d/i   IMPRESSION/PLAN 69 y.o. female subacute SAH, likely d#8 with fusiform basilar aneurysm s/p stent-coiling and symptomatic vasospasm. PE on CTA chest without LE DVT - ?related to concurrent pneumonia - Symptomatic vasospasm: improved compared to yesterday with [place]. Will continue. Nimotop. TCDs tomorrow. - PE: IVC placed yesterday by IR. Continue DAPT - Continue supportive care - Appreciate PCCM assistance with management

## 2019-09-03 NOTE — Evaluation (Signed)
Physical Therapy Evaluation Patient Details Name: Haley Mayo MRN: [id number] DOB: [birth year] Today's Date: 09/03/2019   History of Present Illness  Pt is 69 yo female that presented to ED for generalized weakness, pt stated she has been down on the floor at home since 5/7. Reported recent [place] vaccination (5/3). PMH of HLD, HTN. On 5/16 pt with c/o diplopia.CT of head showed SAH 25mm basilar tip aneurysm.  Pt transferred to [facility] for neurosurgery consult. Pt underwent diagnostic cerebral angiogram with stent-supported coil embolization of basila aneurysm on 5/18. Pt underwent IVC filter placement on 5/19.  Clinical Impression  PT presents to PT with deficits in functional mobility, gait, balance, endurance, strength, power, balance, coordination, and cognition. Pt with slowed processing throughout session, requiring increased time to respond to most questions and commands. Pt demonstrates significant functional weakness with knee buckling and impaired ability to maintain standing. Pt experiencing diplopia throughout session and demonstrates impaired coordination when assessed. Pt will continue to benefit from acute PT POC to reduce falls risk and improve mobility quality. PT currently recommending CIR at time of discharge as the pt was independent prior to admission and demonstrates the potential to make significant functional gains. Pt does however report limited family support available at time of discharge, may need to be confirmed with family.    Follow Up Recommendations CIR;Supervision/Assistance - 24 hour    Equipment Recommendations  Wheelchair (measurements PT);Wheelchair cushion (measurements PT);Hospital bed;Other (comment)(mechanical lift, if home today)    Recommendations for Other Services       Precautions / Restrictions Precautions Precautions: Fall Restrictions Weight Bearing Restrictions: No      Mobility  Bed Mobility Overal bed mobility: Needs Assistance Bed  Mobility: Supine to Sit     Supine to sit: Mod assist;HOB elevated        Transfers Overall transfer level: Needs assistance Equipment used: 1 person hand held assist;2 person hand held assist Transfers: Sit to/from [employer] Sit to Stand: Max assist;+2 physical assistance(maxA of 1 to come to incomplete stand at edge of recliner)   Squat pivot transfers: Max assist;+2 physical assistance     General transfer comment: pt unable to maintain knee extension bilaterally at this time, requires cues to fully extend knees  Ambulation/Gait                Stairs            Wheelchair Mobility    Modified Rankin (Stroke Patients Only) Modified Rankin (Stroke Patients Only) Pre-Morbid Rankin Score: No significant disability(difficult to reliable determine, no family present) Modified Rankin: Moderately severe disability     Balance Overall balance assessment: Needs assistance Sitting-balance support: Bilateral upper extremity supported;Feet unsupported Sitting balance-Leahy Scale: Poor Sitting balance - Comments: minA at edge of bed Postural control: Left lateral lean   Standing balance-Leahy Scale: Zero Standing balance comment: maxA to maintain standing                             Pertinent Vitals/Pain Pain Assessment: Faces Faces Pain Scale: Hurts even more Pain Location: LE intermittently with movement Pain Descriptors / Indicators: Moaning Pain Intervention(s): Monitored during session    Home Living Family/patient expects to be discharged to:: Private residence Living Arrangements: Alone Available Help at Discharge: Other (Comment) Type of Home: House Home Access: Stairs to enter Entrance Stairs-Rails: Can reach both Entrance Stairs-Number of Steps: 4 Home Layout: One level Home Equipment: Grab bars -  tub/shower Additional Comments: Pt reports she lives alone, and has limited support at discharge     Prior Function  Level of Independence: Independent         Comments: pt does not drive, uses [employer] transportation for groceries/doctors appointments. Otherwise indep for all ADL/IADL management.     Hand Dominance   Dominant Hand: Right    Extremity/Trunk Assessment   Upper Extremity Assessment Upper Extremity Assessment: Defer to OT evaluation    Lower Extremity Assessment Lower Extremity Assessment: Generalized weakness;RLE deficits/detail;LLE deficits/detail RLE Deficits / Details: 3+5 knee extension and hip extension during functional activity, 4-/5 PF/DF bilaterally RLE Sensation: WNL RLE Coordination: decreased gross motor(dysmetria, slow and deliberate finger to nose) LLE Deficits / Details: 3+5 knee extension and hip extension during functional activity, 4-/5 PF/DF bilaterally LLE Sensation: WNL LLE Coordination: decreased gross motor(dysmetria, slow and deliberate finger to nose)    Cervical / Trunk Assessment Cervical / Trunk Assessment: Normal  Communication   Communication: Expressive difficulties(delayed responses)  Cognition Arousal/Alertness: (letahrgic initially but awakening with mobility) Behavior During Therapy: Flat affect Overall Cognitive Status: Impaired/Different from baseline Area of Impairment: Attention;Following commands;Memory;Safety/judgement;Awareness;Problem solving;Orientation                 Orientation Level: Disoriented to;Time;Situation Current Attention Level: Sustained Memory: Decreased recall of precautions;Decreased short-term memory Following Commands: Follows one step commands with increased time(slowed responses) Safety/Judgement: Decreased awareness of safety;Decreased awareness of deficits Awareness: Emergent Problem Solving: Slow processing;Decreased initiation;Difficulty sequencing;Requires verbal cues;Requires tactile cues        General Comments General comments (skin integrity, edema, etc.): pt on 3L [place], BP pre-mobility 146/65  (90). BP sitting at edge of bed 134/61 (82), BP at end of session 127/55 (76). Pt with diplopia throughout session, her glasses do not improve her vision. Pt with significantly slowed processing.    Exercises     Assessment/Plan    PT Assessment Patient needs continued PT services  PT Problem List Decreased strength;Decreased activity tolerance;Decreased balance;Decreased mobility;Decreased coordination;Decreased knowledge of use of DME;Decreased cognition;Decreased safety awareness;Decreased knowledge of precautions;Cardiopulmonary status limiting activity;Pain       PT Treatment Interventions DME instruction;Gait training;Stair training;Functional mobility training;Therapeutic activities;Therapeutic exercise;Balance training;Neuromuscular re-education;Cognitive remediation;Patient/family education;Wheelchair mobility training    PT Goals (Current goals can be found in the Care Plan section)  Acute Rehab PT Goals Patient Stated Goal: to improve mobility PT Goal Formulation: With patient Time For Goal Achievement: 09/17/19 Potential to Achieve Goals: Good    Frequency Min 4X/week   Barriers to discharge        Co-evaluation PT/OT/SLP Co-Evaluation/Treatment: Yes Reason for Co-Treatment: Complexity of the patient's impairments (multi-system involvement);For patient/therapist safety;To address functional/ADL transfers PT goals addressed during session: Mobility/safety with mobility;Balance;Strengthening/ROM         AM-PAC PT "6 Clicks" Mobility  Outcome Measure Help needed turning from your back to your side while in a flat bed without using bedrails?: A Lot Help needed moving from lying on your back to sitting on the side of a flat bed without using bedrails?: A Lot Help needed moving to and from a bed to a chair (including a wheelchair)?: Total Help needed standing up from a chair using your arms (e.g., wheelchair or bedside chair)?: Total Help needed to walk in hospital  room?: Total Help needed climbing 3-5 steps with a railing? : Total 6 Click Score: 8    End of Session Equipment Utilized During Treatment: Gait belt;Oxygen Activity Tolerance: Patient tolerated treatment well Patient left: in chair;with  call bell/phone within reach;with chair alarm set Nurse Communication: Mobility status;Need for lift equipment PT Visit Diagnosis: Other abnormalities of gait and mobility (R26.89);Muscle weakness (generalized) (M62.81);Other symptoms and signs involving the nervous system (R29.898)    Time: 1302-1400 PT Time Calculation (min) (ACUTE ONLY): 58 min   Charges:   PT Evaluation $PT Eval Moderate Complexity: 1 Mod PT Treatments $Therapeutic Activity: 8-22 mins        Zenaida Niece, PT, DPT Acute Rehabilitation Pager: [phone]   Zenaida Niece 09/03/2019, 2:47 PM

## 2019-09-03 NOTE — Progress Notes (Signed)
Occupational Therapy Evaluation PTA, pt lived alone and was independent with ADL and mobility. Pt with significant functional decline requiring Max A +2 with mobility and Max A with ADL tasks. Intermittent complaints of diplopia - will further assess and use partial occlusion if needed. Pt will benefit from intense rehab at [facility], however unsure of level of caregiver support available. Will follow acutely.    09/03/19 1500  OT Visit Information  Last OT Received On 09/03/19  Assistance Needed +2  PT/OT/SLP Co-Evaluation/Treatment Yes  Reason for Co-Treatment Complexity of the patient's impairments (multi-system involvement);For patient/therapist safety;To address functional/ADL transfers;Necessary to address cognition/behavior during functional activity  OT goals addressed during session ADL's and self-care;Strengthening/ROM  History of Present Illness Pt is 69 yo female that presented to ED for generalized weakness, pt stated she has been down on the floor at home since 5/7. Reported recent [place] vaccination (5/3). PMH of HLD, HTN. 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  Pt transferred to [facility] for neurosurgery consult. Pt underwent diagnostic cerebral angiogram with stent-supported coil embolization of basila aneurysm on 5/18. +PE. Pt underwent IVC filter placement on 5/19.  Precautions  Precautions Fall  Restrictions  Weight Bearing Restrictions No  Home Living  Family/patient expects to be discharged to: Private residence  Living Arrangements Alone  Available Help at Discharge Other (Comment)  Type of [place] to enter  Entrance Stairs-Number of Steps 4  Entrance Stairs-Rails Can reach both  [place] One level  Bathroom Shower/Tub Tub/shower unit  [place] bars - tub/shower  Additional Comments Pt reports she lives alone, and has limited support at discharge   Prior Function  Level of  Independence Independent  Comments pt does not drive, uses [employer] transportation for groceries/doctors appointments. Otherwise indep for all ADL/IADL management. Retired from [employer] Expressive difficulties (delayed responses)  Pain Assessment  Pain Assessment Faces  Faces Pain Scale 6  Pain Location LE intermittently with movement  Pain Descriptors / Indicators Moaning  Pain Intervention(s) Limited activity within patient's tolerance  Cognition  Arousal/Alertness  (letahrgic initially but awakening with mobility)  Behavior During Therapy Flat affect  Overall Cognitive Status Impaired/Different from baseline  Area of Impairment Attention;Following commands;Memory;Safety/judgement;Awareness;Problem solving;Orientation  Orientation Level Disoriented to;Time;Situation  Current Attention Level Sustained  Memory Decreased recall of precautions;Decreased short-term memory  Following Commands Follows one step commands with increased time (slowed responses)  Safety/Judgement Decreased awareness of safety;Decreased awareness of deficits  Awareness Emergent  Problem Solving Slow processing;Decreased initiation;Difficulty sequencing;Requires verbal cues;Requires tactile cues  Upper Extremity Assessment  Upper Extremity Assessment RUE deficits/detail;LUE deficits/detail  RUE Deficits / Details generalized weakness; using functionally; edematous hand, most likley from [place] and immobilization; decreaed fine motor coordination - will further assess  RUE Coordination decreased fine motor  LUE Deficits / Details generalized weakness; using functionally; mild incoordination  LUE Coordination decreased fine motor  Lower Extremity Assessment  Lower Extremity Assessment Defer to PT evaluation  RLE Coordination  (dysmetria, slow and deliberate finger to nose)  LLE Coordination  (dysmetria, slow and deliberate finger to nose)  Cervical / Trunk Assessment  Cervical / Trunk  Assessment Normal  ADL  Overall ADL's  Needs assistance/impaired  Eating/Feeding NPO  Grooming Moderate assistance;Cueing for sequencing;Cueing for safety;Sitting  Grooming Details (indicate cue type and reason) On entry, pt asleep in bed with yonker in her mouth; Attentional deficits  Upper Body Bathing  Moderate assistance;Sitting  Lower Body Bathing Maximal assistance;Sit to/from stand  Upper Body Dressing  Maximal assistance;Sitting  Lower Body Dressing Total assistance;Sit to/from stand  Toileting- Forensic psychologist Total assistance  Toileting - Clothing Manipulation Details (indicate cue type and reason) incontinenent of BM during session  Functional mobility during ADLs +2 for physical assistance;Maximal assistance;Cueing for safety;Cueing for sequencing  General ADL Comments Unable to stand fully upright  Vision- History  Baseline Vision/History Wears glasses  Wears Glasses At all times  Vision- Assessment  Vision Assessment? Vision impaired- to be further tested in functional context  Depth Perception Overshoots;Undershoots  Additional Comments Pt complaining of diplopia at times; inconsistent; will further assess; depth perception imparied  Bed Mobility  Overal bed mobility Needs Assistance  Bed Mobility Supine to Sit  Supine to sit Mod assist;HOB elevated  General bed mobility comments difficulty with problem solving and sustaining attention during mobility  Transfers  Overall transfer level Needs assistance  Equipment used 1 person hand held assist;2 person hand held assist  Transfers Sit to/from [employer] Transfers  Sit to Stand Max assist;+2 physical assistance (maxA of 1 to come to incomplete stand at edge of recliner)  Squat pivot transfers Max assist;+2 physical assistance  General transfer comment pt unable to maintain knee extension bilaterally at this time, requires cues to fully extend knees  Balance  Overall balance assessment Needs  assistance  Sitting-balance support Bilateral upper extremity supported;Feet unsupported  Sitting balance-Leahy Scale Poor  Sitting balance - Comments minA at edge of bed  Postural control Left lateral lean  Standing balance-Leahy Scale Zero  Standing balance comment maxA to maintain standing  General Comments  General comments (skin integrity, edema, etc.) pt on 3L [place], BP pre-mobility 146/65 (90). BP sitting at edge of bed 134/61 (82), BP at end of session 127/55 (76). Pt with diplopia throughout session, her glasses do not improve her vision. Pt with significantly slowed processing.  OT - End of Session  Equipment Utilized During Treatment Gait belt;Oxygen  Activity Tolerance Patient tolerated treatment well  Patient left in chair;with call bell/phone within reach;with chair alarm set  Nurse Communication Mobility status;Need for lift equipment Mayo Ao)  OT Assessment  OT Recommendation/Assessment Patient needs continued OT Services  OT Visit Diagnosis Other abnormalities of gait and mobility (R26.89);Muscle weakness (generalized) (M62.81);Low vision, both eyes (H54.2);Other symptoms and signs involving cognitive function  Symptoms and signs involving cognitive functions Nontraumatic SAH  OT Problem List Decreased strength;Decreased activity tolerance;Impaired balance (sitting and/or standing);Impaired vision/perception;Decreased coordination;Decreased cognition;Decreased safety awareness;Decreased knowledge of use of DME or AE;Cardiopulmonary status limiting activity;Impaired UE functional use;Increased edema  OT Plan  OT Frequency (ACUTE ONLY) Min 2X/week  OT Treatment/Interventions (ACUTE ONLY) Self-care/ADL training;Therapeutic exercise;Neuromuscular education;DME and/or AE instruction;Therapeutic activities;Cognitive remediation/compensation;Visual/perceptual remediation/compensation;Patient/family education;Balance training  AM-PAC OT "6 Clicks" Daily Activity Outcome Measure  (Version 2)  Help from another person eating meals? 1  Help from another person taking care of personal grooming? 2  Help from another person toileting, which includes using toliet, bedpan, or urinal? 1  Help from another person bathing (including washing, rinsing, drying)? 2  Help from another person to put on and taking off regular upper body clothing? 2  Help from another person to put on and taking off regular lower body clothing? 1  6 Click Score 9  OT Recommendation  Recommendations for Other Services Rehab consult  Follow Up Recommendations CIR;Supervision/Assistance - 24 hour  OT Equipment 3 in 1 bedside commode  Individuals Consulted  Consulted  and Agree with Results and Recommendations Patient unable/family or caregiver not available  Acute Rehab OT Goals  Patient Stated Goal to get better  OT Goal Formulation Patient unable to participate in goal setting  Time For Goal Achievement 09/17/19  Potential to Achieve Goals Good  OT Time Calculation  OT Start Time (ACUTE ONLY) 1304  OT Stop Time (ACUTE ONLY) 1400  OT Time Calculation (min) 56 min  OT General Charges  $OT Visit 1 Visit  OT Evaluation  $OT Eval High Complexity 1 High  OT Treatments  $Self Care/Home Management  8-22 mins  Written Expression  Dominant Hand Right  Maurie Boettcher, OT/L   Acute OT Clinical Specialist [place] Pager [phone] Office [phone]

## 2019-09-03 NOTE — Progress Notes (Signed)
Noted that patient is on continuous tube feedings. Recommend changing Novolog correction scale to every 4 hours while NPO and on tube feedings.  Noted that patient's HgbA1C is 6.7% which is a diagnosis of diabetes by the American Diabetes Association.   Will continue to monitor blood sugars while in the hospital.   Harvel Ricks RN BSN CDE Diabetes Coordinator Pager: [phone]  8am-5pm

## 2019-09-03 NOTE — Progress Notes (Signed)
  Echocardiogram 2D Echocardiogram has been performed.  Haley Mayo 09/03/2019, 9:53 AM

## 2019-09-04 ENCOUNTER — Inpatient Hospital Stay (HOSPITAL_COMMUNITY): Payer: Medicare Other

## 2019-09-04 DIAGNOSIS — I609 Nontraumatic subarachnoid hemorrhage, unspecified: Secondary | ICD-10-CM

## 2019-09-04 LAB — CULTURE, BLOOD (ROUTINE X 2)
Culture: NO GROWTH
Culture: NO GROWTH
Special Requests: ADEQUATE
Special Requests: ADEQUATE

## 2019-09-04 LAB — BASIC METABOLIC PANEL
Anion gap: 10 (ref 5–15)
Anion gap: 8 (ref 5–15)
BUN: 7 mg/dL — ABNORMAL LOW (ref 8–23)
BUN: 8 mg/dL (ref 8–23)
CO2: 29 mmol/L (ref 22–32)
CO2: 28 mmol/L (ref 22–32)
Calcium: 8.3 mg/dL — ABNORMAL LOW (ref 8.9–10.3)
Calcium: 8.6 mg/dL — ABNORMAL LOW (ref 8.9–10.3)
Chloride: 100 mmol/L (ref 98–111)
Chloride: 100 mmol/L (ref 98–111)
Creatinine, Ser: 0.44 mg/dL (ref 0.44–1.00)
Creatinine, Ser: 0.41 mg/dL — ABNORMAL LOW (ref 0.44–1.00)
GFR calc Af Amer: >60 mL/min (ref >60)
GFR calc Af Amer: >60 mL/min (ref >60)
GFR calc non Af Amer: >60 mL/min (ref >60)
GFR calc non Af Amer: >60 mL/min (ref >60)
Glucose, Bld: 173 mg/dL — ABNORMAL HIGH (ref 70–99)
Glucose, Bld: 190 mg/dL — ABNORMAL HIGH (ref 70–99)
Potassium: 2.7 mmol/L — CL (ref 3.5–5.1)
Potassium: 4.1 mmol/L (ref 3.5–5.1)
Sodium: 139 mmol/L (ref 135–145)
Sodium: 136 mmol/L (ref 135–145)

## 2019-09-04 LAB — CBC
HCT: 36.2 % (ref 36.0–46.0)
Hemoglobin: 12.1 g/dL (ref 12.0–15.0)
MCH: 27.8 pg (ref 26.0–34.0)
MCHC: 33.4 g/dL (ref 30.0–36.0)
MCV: 83.2 fL (ref 80.0–100.0)
Platelets: 369 10*3/uL (ref 150–400)
RBC: 4.35 MIL/uL (ref 3.87–5.11)
RDW: 14.6 % (ref 11.5–15.5)
WBC: 23.7 10*3/uL — ABNORMAL HIGH (ref 4.0–10.5)
nRBC: 0 % (ref 0.0–0.2)

## 2019-09-04 LAB — GLUCOSE, CAPILLARY
Glucose-Capillary: 178 mg/dL — ABNORMAL HIGH (ref 70–99)
Glucose-Capillary: 178 mg/dL — ABNORMAL HIGH (ref 70–99)
Glucose-Capillary: 166 mg/dL — ABNORMAL HIGH (ref 70–99)
Glucose-Capillary: 137 mg/dL — ABNORMAL HIGH (ref 70–99)
Glucose-Capillary: 164 mg/dL — ABNORMAL HIGH (ref 70–99)
Glucose-Capillary: 180 mg/dL — ABNORMAL HIGH (ref 70–99)

## 2019-09-04 MED ORDER — POTASSIUM CHLORIDE 20 MEQ PO PACK
40.0000 meq | PACK | ORAL | Status: AC
  Administered 2019-09-04 (×3): 40 meq
  Filled 2019-09-04 (×3): qty 2

## 2019-09-04 MED ORDER — LOPERAMIDE HCL 2 MG PO CAPS
2.0000 mg | ORAL_CAPSULE | ORAL | Status: DC | PRN
  Administered 2019-09-04 – 2019-09-06 (×3): 2 mg via ORAL
  Filled 2019-09-04 (×4): qty 1

## 2019-09-04 MED ORDER — POTASSIUM CHLORIDE 20 MEQ/15ML (10%) PO SOLN
40.0000 meq | Freq: Once | ORAL | Status: AC
  Administered 2019-09-04: 40 meq
  Filled 2019-09-04: qty 30

## 2019-09-04 NOTE — Progress Notes (Signed)
Occupational Therapy Treatment Patient Details Name: Haley Mayo MRN: [id number] DOB: 1950/07/10 Today's Date: 09/04/2019    History of present illness Pt is 69 yo female that presented to ED for generalized weakness, pt stated she has been down on the floor at home since 5/7. Reported recent [place] vaccination (5/3). PMH of HLD, HTN. 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  Pt transferred to [facility] for neurosurgery consult. Pt underwent diagnostic cerebral angiogram with stent-supported coil embolization of basila aneurysm on 5/18. +PE. Pt underwent IVC filter placement on 5/19.   OT comments  Pt transferred from bed to chair this session. Pt reports the clock is very strange. Pt reports visual changes in R visual field . Pt describes the clock being two in R peripheral with second clock above the original clock. Pt with glasses Taped on L eye and noted to have monocular vision at this time in all fields. Pt requires extensive questions to further determine visual changes as pt will turn head for central vision as a compensatory method. Recommend OOB to chair for meals with total +2 max (A).   Follow Up Recommendations  CIR;Supervision/Assistance - 24 hour    Equipment Recommendations  3 in 1 bedside commode    Recommendations for Other Services Rehab consult    Precautions / Restrictions Precautions Precautions: Fall Precaution Comments: R visual field diplopia, improved with taped glasses Restrictions Weight Bearing Restrictions: No       Mobility Bed Mobility Overal bed mobility: Needs Assistance Bed Mobility: Supine to Sit     Supine to sit: Mod assist        Transfers Overall transfer level: Needs assistance Equipment used: Rolling walker (2 wheeled);2 person hand held assist Transfers: Sit to/from [employer] Transfers Sit to Stand: Max assist;+2 physical assistance(maxA x1 from recliner with RW on 2nd attempt) Stand  pivot transfers: Max assist;+2 physical assistance       General transfer comment: pt able to step toward chair with (A) of pad a hips     Balance Overall balance assessment: Needs assistance Sitting-balance support: Bilateral upper extremity supported Sitting balance-Leahy Scale: Poor Sitting balance - Comments: modA due to R lateral lean Postural control: Right lateral lean Standing balance support: Bilateral upper extremity supported Standing balance-Leahy Scale: Poor Standing balance comment: modA with BUE support of RW, maxA without use of RW                           ADL either performed or assessed with clinical judgement   ADL Overall ADL's : Needs assistance/impaired Eating/Feeding: NPO   Grooming: Wash/dry face;Moderate assistance                   Toilet Transfer: +2 for physical assistance;Maximal assistance Toilet Transfer Details (indicate cue type and reason): stand pivot to chair and unaware on incontinence of bowel Toileting- Clothing Manipulation and Hygiene: Total assistance Toileting - Clothing Manipulation Details (indicate cue type and reason): static standing for peri care       General ADL Comments: pt progressed from bed to chair this session with total (A) peri care due to incontinence. pt requires encouragement to stay in chair as patient is fatigued. pt noted to have skin break down at buttock and very loose stool     Vision       Perception     Praxis      Cognition Arousal/Alertness:  Awake/alert Behavior During Therapy: Flat affect Overall Cognitive Status: Impaired/Different from baseline Area of Impairment: Attention;Following commands;Memory;Safety/judgement;Awareness;Problem solving                   Current Attention Level: Sustained Memory: Decreased recall of precautions;Decreased short-term memory Following Commands: Follows one step commands consistently Safety/Judgement: Decreased awareness of  safety;Decreased awareness of deficits Awareness: Emergent Problem Solving: Slow processing;Decreased initiation;Requires verbal cues;Requires tactile cues General Comments: pt very surprised with IV in front of her while sitting eob and asking questions about equipment. But at other times in session shows some awareness by stating "can you image eating from this" pointing toward NG tube.         Exercises     Shoulder Instructions       General Comments VSS on 3L [place]    Pertinent Vitals/ Pain       Pain Assessment: Faces Faces Pain Scale: Hurts even more Pain Location: buttocks, skin breakdown Pain Descriptors / Indicators: Moaning Pain Intervention(s): Repositioned;Premedicated before session;Monitored during session  Home Living                                          Prior Functioning/Environment              Frequency  Min 2X/week        Progress Toward Goals  OT Goals(current goals can now be found in the care plan section)  Progress towards OT goals: Progressing toward goals  Acute Rehab OT Goals Patient Stated Goal: to take a nap OT Goal Formulation: Patient unable to participate in goal setting Time For Goal Achievement: 09/17/19 Potential to Achieve Goals: Good ADL Goals Pt Will Perform Grooming: with set-up;with supervision;sitting Pt Will Perform Upper Body Bathing: with set-up;with supervision;sitting Pt Will Perform Lower Body Bathing: with min assist;sit to/from stand;sitting/lateral leans Pt Will Perform Lower Body Dressing: with adaptive equipment;sit to/from stand;with set-up;with supervision Pt Will Transfer to Toilet: with mod assist;bedside commode;stand pivot transfer Pt Will Perform Toileting - Clothing Manipulation and hygiene: with set-up;with supervision;sit to/from stand;with adaptive equipment Additional ADL Goal #1: Pt will demonstrate selective attention during ADL task in nondistracting environment with minimal  redirectional cues  Plan Discharge plan remains appropriate    Co-evaluation    PT/OT/SLP Co-Evaluation/Treatment: Yes Reason for Co-Treatment: Complexity of the patient's impairments (multi-system involvement);For patient/therapist safety;To address functional/ADL transfers PT goals addressed during session: Mobility/safety with mobility;Balance;Proper use of DME;Strengthening/ROM OT goals addressed during session: Proper use of Adaptive equipment and DME;ADL's and self-care;Strengthening/ROM      AM-PAC OT "6 Clicks" Daily Activity     Outcome Measure   Help from another person eating meals?: Total Help from another person taking care of personal grooming?: A Lot Help from another person toileting, which includes using toliet, bedpan, or urinal?: Total Help from another person bathing (including washing, rinsing, drying)?: A Lot Help from another person to put on and taking off regular upper body clothing?: A Lot Help from another person to put on and taking off regular lower body clothing?: Total 6 Click Score: 9    End of Session Equipment Utilized During Treatment: Other (comment)  OT Visit Diagnosis: Other abnormalities of gait and mobility (R26.89);Muscle weakness (generalized) (M62.81);Low vision, both eyes (H54.2);Other symptoms and signs involving cognitive function Symptoms and signs involving cognitive functions: Nontraumatic SAH   Activity Tolerance Patient tolerated treatment well  Patient Left in chair;with call bell/phone within reach;with bed alarm set   Nurse Communication Mobility status;Precautions        Time: [id number] OT Time Calculation (min): 34 min  Charges: OT General Charges $OT Visit: 1 Visit OT Treatments $Self Care/Home Management : 8-22 mins   Brynn, OTR/L  Acute Rehabilitation Services Pager: [phone] Office: [phone] .\    Jeri Modena 09/04/2019, 2:42 PM

## 2019-09-04 NOTE — Progress Notes (Signed)
Physical Therapy Treatment Patient Details Name: Haley Mayo MRN: [id number] DOB: 03-Jun-1950 Today's Date: 09/04/2019    History of Present Illness Pt is 69 yo female that presented to ED for generalized weakness, pt stated she has been down on the floor at home since 5/7. Reported recent [place] vaccination (5/3). PMH of HLD, HTN. 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  Pt transferred to [facility] for neurosurgery consult. Pt underwent diagnostic cerebral angiogram with stent-supported coil embolization of basila aneurysm on 5/18. +PE. Pt underwent IVC filter placement on 5/19.    PT Comments    Pt tolerated treatment well. Pt continues to demonstrate slowed processing and is easily distracted by different objects present in the room. Pt continues to require significant physical assistance to mobilize, although is able to maintain knee extension better with use of RW in static standing than with only physical support of PT. Pt with diplopia in R visual field which appears to be mitigated with taping of medial half of L lens of glasses. Pt will benefit from continued acute PT POC to improve functional strength and mobility quality. Pt remains at a high falls risk and will benefit from high intensity inpatient PT services at [facility] to reduce falls risk and aide in a return to her prior level of function.   Follow Up Recommendations  CIR;Supervision/Assistance - 24 hour     Equipment Recommendations  Wheelchair (measurements PT);Wheelchair cushion (measurements PT);Hospital bed;Other (comment)    Recommendations for Other Services       Precautions / Restrictions Precautions Precautions: Fall Precaution Comments: R visual field diplopia, improved with taped glasses Restrictions Weight Bearing Restrictions: No    Mobility  Bed Mobility Overal bed mobility: Needs Assistance Bed Mobility: Supine to Sit     Supine to sit: Mod assist         Transfers Overall transfer level: Needs assistance Equipment used: Rolling walker (2 wheeled);2 person hand held assist Transfers: Sit to/from Stand;Stand Pivot Transfers Sit to Stand: Max assist;+2 physical assistance(maxA x1 from recliner with RW on 2nd attempt) Stand pivot transfers: Max assist;+2 physical assistance          Ambulation/Gait                 Stairs             Wheelchair Mobility    Modified Rankin (Stroke Patients Only) Modified Rankin (Stroke Patients Only) Pre-Morbid Rankin Score: No significant disability(unsure of baseline) Modified Rankin: Moderately severe disability     Balance Overall balance assessment: Needs assistance Sitting-balance support: Bilateral upper extremity supported Sitting balance-Leahy Scale: Poor Sitting balance - Comments: modA due to R lateral lean Postural control: Right lateral lean Standing balance support: Bilateral upper extremity supported Standing balance-Leahy Scale: Poor Standing balance comment: modA with BUE support of RW, maxA without use of RW                            Cognition Arousal/Alertness: Awake/alert Behavior During Therapy: Flat affect Overall Cognitive Status: Impaired/Different from baseline Area of Impairment: Attention;Following commands;Memory;Safety/judgement;Awareness;Problem solving                   Current Attention Level: Sustained Memory: Decreased recall of precautions;Decreased short-term memory Following Commands: Follows one step commands consistently Safety/Judgement: Decreased awareness of safety;Decreased awareness of deficits Awareness: Emergent Problem Solving: Slow processing;Decreased initiation;Requires verbal cues;Requires tactile cues  Exercises      General Comments General comments (skin integrity, edema, etc.): pt on 3L [place], VSS      Pertinent Vitals/Pain Pain Assessment: Faces Faces Pain Scale: Hurts even more Pain  Location: buttocks, skin breakdown Pain Descriptors / Indicators: Moaning Pain Intervention(s): Monitored during session    Home Living                      Prior Function            PT Goals (current goals can now be found in the care plan section) Acute Rehab PT Goals Patient Stated Goal: to get better Progress towards PT goals: Progressing toward goals    Frequency    Min 4X/week      PT Plan Current plan remains appropriate    Co-evaluation PT/OT/SLP Co-Evaluation/Treatment: Yes Reason for Co-Treatment: Complexity of the patient's impairments (multi-system involvement);Necessary to address cognition/behavior during functional activity;For patient/therapist safety;To address functional/ADL transfers PT goals addressed during session: Mobility/safety with mobility;Balance;Proper use of DME;Strengthening/ROM        AM-PAC PT "6 Clicks" Mobility   Outcome Measure  Help needed turning from your back to your side while in a flat bed without using bedrails?: A Lot Help needed moving from lying on your back to sitting on the side of a flat bed without using bedrails?: A Lot Help needed moving to and from a bed to a chair (including a wheelchair)?: Total Help needed standing up from a chair using your arms (e.g., wheelchair or bedside chair)?: Total Help needed to walk in hospital room?: Total Help needed climbing 3-5 steps with a railing? : Total 6 Click Score: 8    End of Session Equipment Utilized During Treatment: Oxygen Activity Tolerance: Patient tolerated treatment well Patient left: in chair;with call bell/phone within reach;with chair alarm set Nurse Communication: Mobility status;Need for lift equipment PT Visit Diagnosis: Other abnormalities of gait and mobility (R26.89);Muscle weakness (generalized) (M62.81);Other symptoms and signs involving the nervous system (R29.898)     Time: [id number] PT Time Calculation (min) (ACUTE ONLY): 31  min  Charges:  $Therapeutic Activity: 8-22 mins                     Zenaida Niece, PT, DPT Acute Rehabilitation Pager: [phone]    Zenaida Niece 09/04/2019, 12:34 PM

## 2019-09-04 NOTE — Progress Notes (Signed)
  NEUROSURGERY PROGRESS NOTE   Pt seen and examined. No issues overnight. No complaints this am.  EXAM: Temp:  [97.8 F (36.6 C)-98.7 F (37.1 C)] 98.7 F (37.1 C) (05/21 0400) Pulse Rate:  [62-88] 79 (05/21 0600) Resp:  [0-34] 19 (05/21 0700) BP: (96-169)/(52-81) 96/78 (05/21 0700) SpO2:  [87 %-100 %] 95 % (05/21 0600) Arterial Line BP: (135-232)/(51-84) 197/77 (05/21 0430) Weight:  [73 kg] 73 kg (05/21 0500) Intake/Output      05/20 0701 - 05/21 0700 05/21 0701 - 05/22 0700   P.O.     I.V. (mL/kg) 3595.9 (49.3)    NG/GT 2160    IV Piggyback 711.5    Total Intake(mL/kg) 6467.4 (88.6)    Urine (mL/kg/hr) 7150 (4.1)    Stool 0    Total Output 7150    Net -682.6         Stool Occurrence 6 x     Awake, alert, oriented to person, hospital, year, illness Speech is fluent CN grossly intact MAE good strength   LABS: Lab Results  Component Value Date   CREATININE 0.44 09/04/2019   BUN 7 (L) 09/04/2019   NA 139 09/04/2019   K 2.7 (LL) 09/04/2019   CL 100 09/04/2019   CO2 29 09/04/2019   Lab Results  Component Value Date   WBC 23.7 (H) 09/04/2019   HGB 12.1 09/04/2019   HCT 36.2 09/04/2019   MCV 83.2 09/04/2019   PLT 369 09/04/2019    IMPRESSION: - 69 y.o. female SAH d# 9 s/p stent-coil fusiform basilar aneurysm, severe symptomatic spasm is responding well clinically to hyperdynamic treatment - Small PE, asymptomatic. S/P IVC filter placement  PLAN: - Will cont IVF hydration and vasopressor support for MAP>110 or SBP > 162mmHg - Cont ASA/Plavix for stent construct - TCD today - Cont Nimotop - PT/OT - SLP eval today, if able to safely take PO can likely d/c coretrac tomorrow.

## 2019-09-04 NOTE — Consult Note (Addendum)
Physical Medicine and Rehabilitation Consult Reason for Consult: Weakness/headache/diplopia Referring Physician: Dr. Kathyrn Sheriff   HPI: Haley Mayo is a 69 y.o. right-handed female with history of hypertension as well as hyperlipidemia and tobacco abuse.  Per chart review patient lives alone.  Independent prior to admission.  1 level home 4 steps to entry.  Limited support at discharge.  She has 2 children 1 lives in [place] and one lives in [place].  Presented 08/26/2019 to [facility] after being found down for an extended time.  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  CT angiogram of the chest showed filling defect within the proximal right upper lobe apical and posterior segmental arteries consistent with pulmonary embolus.  Moderate cardiomegaly.  There was also 2.3 cm right thyroid heterogeneous nodule.  Venous Doppler studies lower extremity showed no DVT.  Admission chemistries showed a WBC of 27,100, hemoglobin 15.4, CK 3162, lactic acid 1.2, glucose 166, BUN 28, creatinine 0.81, troponin 23, SARS coronavirus negative.  Chest x-ray showed mild bibasilar atelectasis and/or infiltrate.  Placed on broad-spectrum antibiotics.  She was transferred from [facility] to [facility] 08/30/2019 for ongoing follow-up and care.  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 Kathyrn Sheriff.  EEG completed showing no seizure.  She underwent IVC filter placement 09/02/2019 per interventional radiology for pulmonary emboli.  She currently remains on subcutaneous heparin for DVT prophylaxis.  Aspirin and Plavix initiated after embolization.  She  is currently n.p.o. with alternative means of nutritional support.  Blood pressure monitored currently on Nimotop.  Therapy evaluations completed with recommendations of physical medicine rehab consult.  Review of Systems  Constitutional: Negative for chills and fever.  HENT: Negative for hearing loss.   Eyes: Positive for double vision.  Respiratory: Negative for cough and shortness of breath.   Cardiovascular: Negative for chest pain, palpitations and leg swelling.  Gastrointestinal: Positive for constipation. Negative for heartburn, nausea and vomiting.  Genitourinary: Negative for flank pain and hematuria.  Musculoskeletal: Positive for joint pain and myalgias.  Skin: Negative for rash.  Neurological: Positive for dizziness and headaches.  All other systems reviewed and are negative.  Past Medical History:  Diagnosis Date  . HLD (hyperlipidemia)   . HTN (hypertension)    controlled  . Leukocytosis    ?   Marland Kitchen Neoplasm of ear    left ear lobe  . Obesity   . Wears dentures    upper   Past Surgical History:  Procedure Laterality Date  . ABDOMINAL HYSTERECTOMY  Age 89  . COLONOSCOPY  09/2004   normal, rpt 10 yrs  . EAR CYST EXCISION Left 09/27/2017   Procedure: EXCISION EAR LOBE CYST;  Surgeon: Beverly Gust, MD;  Location: [place];  Service: ENT;  Laterality: Left;  LOCAL ONLY  . IR IVC FILTER PLMT / S&I /IMG GUID/MOD SED  09/02/2019  . OOPHORECTOMY    . RADIOLOGY WITH ANESTHESIA N/A 09/01/2019   Procedure: IR WITH ANESTHESIA;  Surgeon: Consuella Lose, MD;  Location: [place];  Service: Radiology;  Laterality: N/A;  . TOTAL VAGINAL HYSTERECTOMY  05/2001   heavy bleeding, fibroids   Family  History  Problem Relation Age of Onset  . Ulcers Father   . Pneumonia Mother   . Diabetes Sister   . Stroke Brother   . Lupus Sister   . Stroke Sister        hemorrhage after fall  . Lupus Sister   . Rheum arthritis Sister        ?  . Diabetes Sister   . Other Sister          peritoneal dialysis   Social History:  reports that she has been smoking cigarettes. She has a 40.00 pack-year smoking history. She uses smokeless tobacco. She reports that she does not drink alcohol or use drugs. Allergies: No Known Allergies Medications Prior to Admission  Medication Sig Dispense Refill  . acetaminophen (TYLENOL) 325 MG tablet Take 2 tablets (650 mg total) by mouth every 6 (six) hours as needed for mild pain, fever or headache.    Marland Kitchen amLODipine-benazepril (LOTREL) 10-20 MG capsule Take 1 capsule by mouth every evening.   3  . Coenzyme Q10-Omega 3 Fatty Acd (HEALTHY HEART) EMUL Take 1 capsule by mouth daily.      . Multiple Vitamins-Minerals (EMERGEN-C VITAMIN C PO) Take 1 tablet by mouth daily.     Marland Kitchen amLODipine (NORVASC) 10 MG tablet Take 1 tablet (10 mg total) by mouth daily. (Patient not taking: Reported on 08/31/2019)    . benazepril (LOTENSIN) 20 MG tablet Take 1 tablet (20 mg total) by mouth daily. (Patient not taking: Reported on 08/31/2019)    . ceFEPIme 2 g in sodium chloride 0.9 % 100 mL Inject 2 g into the vein every 8 (eight) hours. (Patient not taking: Reported on 08/31/2019)    . enoxaparin (LOVENOX) 40 MG/0.4ML injection Inject 0.4 mLs (40 mg total) into the skin daily. (Patient not taking: Reported on 08/31/2019) 0 mL   . niMODipine (NIMOTOP) 30 MG capsule Take 2 capsules (60 mg total) by mouth every 4 (four) hours. (Patient not taking: Reported on 08/31/2019)    . polyethylene glycol (MIRALAX / GLYCOLAX) 17 g packet Take 17 g by mouth daily. (Patient not taking: Reported on 08/31/2019) 14 each 0  . vancomycin (VANCOREADY) 1250 MG/250ML SOLN Inject 250 mLs (1,250 mg total) into the vein daily. (Patient not taking: Reported on 08/31/2019)      Home: Home Living Family/patient expects to be discharged to:: Private residence Living Arrangements: Alone Available Help at Discharge: Other (Comment) Type of Home: House Home Access: Stairs to enter [employer] of Steps: 4 Entrance Stairs-Rails: Can reach both Home Layout: One level Bathroom Shower/Tub: Chiropodist: Standard Home Equipment: Grab bars - tub/shower Additional Comments: Pt reports she lives alone, and has limited support at discharge   Functional History: Prior Function Level of Independence: Independent Comments: pt does not drive, uses [employer] transportation for groceries/doctors appointments. Otherwise indep for all ADL/IADL management. Retired from [employer]:  Mobility: Bed Mobility Overal bed mobility: Needs Assistance Bed Mobility: Supine to Sit Supine to sit: Mod assist, HOB elevated General bed mobility comments: difficulty with problem solving and sustaining attention during mobility Transfers Overall transfer level: Needs assistance Equipment used: 1 person hand held assist, 2 person hand held assist Transfers: Sit to/from Stand, Google Transfers Sit to Stand: Max assist, +2 physical assistance(maxA of 1 to come to incomplete stand at edge of recliner) Squat pivot transfers: Max assist, +2 physical assistance General transfer comment: pt unable to maintain knee extension bilaterally at this time,  requires cues to fully extend knees     ADL: ADL Overall ADL's : Needs assistance/impaired Eating/Feeding: NPO Grooming: Moderate assistance, Cueing for sequencing, Cueing for safety, Sitting Grooming Details (indicate cue type and reason): On entry, pt asleep in bed with yonker in her mouth; Attentional deficits Upper Body Bathing: Moderate assistance, Sitting Lower Body Bathing: Maximal assistance, Sit to/from stand Upper Body Dressing : Maximal assistance, Sitting Lower Body Dressing: Total assistance, Sit to/from stand Toileting- Water quality scientist and Hygiene: Total assistance Toileting - Clothing Manipulation Details (indicate cue type and reason): incontinenent of BM during session Functional mobility  during ADLs: +2 for physical assistance, Maximal assistance, Cueing for safety, Cueing for sequencing General ADL Comments: Unable to stand fully upright  Cognition: Cognition Overall Cognitive Status: Impaired/Different from baseline Arousal/Alertness: (drowsy) Orientation Level: Oriented X4 Attention: Selective Selective Attention: Impaired Selective Attention Impairment: Verbal basic Memory: Impaired Memory Impairment: Storage deficit Awareness: Impaired Awareness Impairment: Intellectual impairment Comments: delayed processing Cognition Arousal/Alertness: (letahrgic initially but awakening with mobility) Behavior During Therapy: Flat affect Overall Cognitive Status: Impaired/Different from baseline Area of Impairment: Attention, Following commands, Memory, Safety/judgement, Awareness, Problem solving, Orientation Orientation Level: Disoriented to, Time, Situation Current Attention Level: Sustained Memory: Decreased recall of precautions, Decreased short-term memory Following Commands: Follows one step commands with increased time(slowed responses) Safety/Judgement: Decreased awareness of safety, Decreased awareness of deficits Awareness: Emergent Problem Solving: Slow processing, Decreased initiation, Difficulty sequencing, Requires verbal cues, Requires tactile cues General Comments: Pt lethargic and demonstrates difficulty maintaining arousal.  she has periods where she is very conversant and follows commands well, to being very sluggish and difficult to engage   Blood pressure (!) 150/63, pulse 77, temperature 98.7 F (37.1 C), temperature source Oral, resp. rate 17, weight 73 kg, SpO2 95 %.  Physical Exam   General: Alert and oriented x 3 (few days off on date), No apparent distress, Patient is lethargic but arousable. HEENT: Head is normocephalic, atraumatic, PERRLA, EOMI, sclera anicteric, Nasogastric tube in place. Neck: Supple without JVD or lymphadenopathy Heart:  Reg rate and rhythm. No murmurs rubs or gallops Chest: CTA bilaterally without wheezes, rales, or rhonchi; no distress Abdomen: Soft, non-tender, non-distended, bowel sounds positive. Extremities: No clubbing, cyanosis, or edema. Pulses are 2+ Skin: Clean and intact without signs of breakdown Neuro/MSK: Makes eye contact with examiner. Motor function is grossly 4+/5, with the exception of 4-/5 in bilateral lower extremity KE, PF, and DF. Follows simple commands.  Psych: Pt's affect is appropriate. Pt is cooperative. Flat but pleasant and conversational at times  Results for orders placed or performed during the hospital encounter of 08/31/19 (from the past 24 hour(s))  Magnesium     Status: None   Collection Time: 09/03/19  6:50 AM  Result Value Ref Range   Magnesium 1.7 1.7 - 2.4 mg/dL  Phosphorus     Status: Abnormal   Collection Time: 09/03/19  6:50 AM  Result Value Ref Range   Phosphorus 1.6 (L) 2.5 - 4.6 mg/dL  Basic metabolic panel     Status: Abnormal   Collection Time: 09/03/19  6:50 AM  Result Value Ref Range   Sodium 142 135 - 145 mmol/L   Potassium 2.2 (LL) 3.5 - 5.1 mmol/L   Chloride 108 98 - 111 mmol/L   CO2 25 22 - 32 mmol/L   Glucose, Bld 193 (H) 70 - 99 mg/dL   BUN 9 8 - 23 mg/dL   Creatinine, Ser 0.46 0.44 - 1.00 mg/dL   Calcium 7.6 (L)  8.9 - 10.3 mg/dL   GFR calc non Af Amer >60 >60 mL/min   GFR calc Af Amer >60 >60 mL/min   Anion gap 9 5 - 15  Glucose, capillary     Status: Abnormal   Collection Time: 09/03/19  7:56 AM  Result Value Ref Range   Glucose-Capillary 164 (H) 70 - 99 mg/dL  Glucose, capillary     Status: Abnormal   Collection Time: 09/03/19 11:30 AM  Result Value Ref Range   Glucose-Capillary 192 (H) 70 - 99 mg/dL  Glucose, capillary     Status: Abnormal   Collection Time: 09/03/19  3:27 PM  Result Value Ref Range   Glucose-Capillary 176 (H) 70 - 99 mg/dL  Magnesium     Status: Abnormal   Collection Time: 09/03/19  3:45 PM  Result Value Ref  Range   Magnesium 2.5 (H) 1.7 - 2.4 mg/dL  Phosphorus     Status: None   Collection Time: 09/03/19  3:45 PM  Result Value Ref Range   Phosphorus 3.1 2.5 - 4.6 mg/dL  Basic metabolic panel     Status: Abnormal   Collection Time: 09/03/19  3:45 PM  Result Value Ref Range   Sodium 140 135 - 145 mmol/L   Potassium 3.1 (L) 3.5 - 5.1 mmol/L   Chloride 103 98 - 111 mmol/L   CO2 25 22 - 32 mmol/L   Glucose, Bld 206 (H) 70 - 99 mg/dL   BUN 7 (L) 8 - 23 mg/dL   Creatinine, Ser 0.40 (L) 0.44 - 1.00 mg/dL   Calcium 7.7 (L) 8.9 - 10.3 mg/dL   GFR calc non Af Amer >60 >60 mL/min   GFR calc Af Amer >60 >60 mL/min   Anion gap 12 5 - 15  Glucose, capillary     Status: Abnormal   Collection Time: 09/03/19  7:20 PM  Result Value Ref Range   Glucose-Capillary 132 (H) 70 - 99 mg/dL  Glucose, capillary     Status: Abnormal   Collection Time: 09/03/19 11:05 PM  Result Value Ref Range   Glucose-Capillary 176 (H) 70 - 99 mg/dL  Glucose, capillary     Status: Abnormal   Collection Time: 09/04/19  3:09 AM  Result Value Ref Range   Glucose-Capillary 178 (H) 70 - 99 mg/dL   CT ANGIO HEAD W OR WO CONTRAST  Addendum Date: 09/02/2019   ADDENDUM REPORT: 09/02/2019 07:47 ADDENDUM: These results were called by telephone at the time of interpretation on 09/02/2019 at 7:47 am to provider Dr. Kathyrn Sheriff , who verbally acknowledged these results. Electronically Signed   By: Kellie Simmering DO   On: 09/02/2019 07:47   Result Date: 09/02/2019 CLINICAL DATA:  Neuro deficit, acute, stroke suspected. EXAM: CT ANGIOGRAPHY HEAD TECHNIQUE: Multidetector CT imaging of the head was performed using the standard protocol during bolus administration of intravenous contrast. Multiplanar CT image reconstructions and MIPs were obtained to evaluate the vascular anatomy. CONTRAST:  13mL OMNIPAQUE IOHEXOL 350 MG/ML SOLN COMPARISON:  CT head/neck 08/30/2019, head CT 08/30/2019, FINDINGS: CT HEAD Brain: Multifocal subarachnoid hemorrhage has  not significantly changed in distribution or extent as compared to prior head CT examination 08/30/2019. As before, the subarachnoid hemorrhage is most notable overlying the bilateral cerebral convexities and within the posterior fossa. Redemonstrated small volume intraventricular hemorrhage within the occipital horns of the lateral ventricles. Also unchanged, there is thin subdural hemorrhage overlying the tentorium. No acute demarcated cortical infarct is identified. Redemonstrated chronic right cerebellar infarct. No evidence  of hydrocephalus at this time.  No midline shift. Vascular: Reported below. Skull: Normal. Negative for fracture or focal lesion. Sinuses: Frothy secretions within the right sphenoid sinus. No significant mastoid effusion Orbits: Visualized orbits show no acute finding. CTA HEAD Anterior circulation: The intracranial internal carotid arteries are patent. Mild calcified plaque within these vessels without significant stenosis. The M1 middle cerebral arteries are patent. There is severe narrowing of the M1 right middle cerebral artery (series 14, image 21). There is severe narrowing of the superior and inferior division proximal M2 left middle cerebral arteries (series 14, image 21). There is severe narrowing of the A1 and proximal A2 anterior cerebral arteries bilaterally. Posterior circulation: There are sites of moderate/severe narrowing within the V4 right vertebral artery. The V4 left vertebral artery is patent without significant stenosis. Apparent moderate narrowing within the mid to distal basilar artery, immediately adjacent to the stent marker (series 12, image 95). Severe narrowing of the proximal right posterior cerebral artery immediately beyond the stented portion. Severe narrowing of the stented portion of the proximal left posterior cerebral artery and beyond. Posterior communicating arteries are poorly delineated. Venous sinuses: Poorly assessed due to contrast timing.  Anatomic variants: As described IMPRESSION: CT head: 1. Subarachnoid hemorrhage, subdural hemorrhage and intraventricular hemorrhage has not significantly changed in extent or distribution as compared to head CT 08/30/2019. 2. No acute demarcated cortical infarct is identified. 3. No evidence of hydrocephalus. 4. Redemonstrated chronic right cerebellar infarct. CTA head: 1. Interval stent assisted coil embolization of a basilar tip aneurysm. 2. Multifocal severe narrowing of the proximal branch vessels of the circle-of-Willis consistent with severe basal spasm, likely with superimposed atherosclerotic disease. Most notably there is severe narrowing of the M1 right middle cerebral artery, proximal M2 left MCA branches, proximal anterior cerebral arteries and proximal posterior cerebral arteries. Additionally, there are sites of moderate/severe narrowing within the V4 right vertebral artery and focal moderate narrowing within the mid to distal basilar artery. Electronically Signed: By: Kellie Simmering DO On: 09/02/2019 07:37   IR IVC FILTER PLMT / S&I Burke Keels GUID/MOD SED  Result Date: 09/02/2019 INDICATION: 69 year old female with pulmonary embolism and inability to anticoagulate referred for filter placement EXAM: ULTRASOUND-GUIDED ACCESS RIGHT INTERNAL JUGULAR VEIN. PLACEMENT OF RETRIEVABLE IVC FILTER MEDICATIONS: None. ANESTHESIA/SEDATION: None. FLUOROSCOPY TIME:  Fluoroscopy Time: 0 minutes 30 seconds COMPLICATIONS: None PROCEDURE: The procedure, risks, benefits, and alternatives were explained to the patient. Specific risks discussed include bleeding, infection, contrast reaction, renal failure, IVC filter fracture, migration, iliocaval thrombus (3-4% incidence), need for further procedure, need for further surgery, pulmonary embolism, cardiopulmonary collapse, death. Questions regarding the procedure were encouraged and answered. The patient understands and consents to the procedure. Ultrasound survey was  performed with images stored and sent to PACs. The neck was prepped with chlorhexidine in a sterile fashion, and a sterile drape was applied covering the operative field. A sterile gown and sterile gloves were used for the procedure. Local anesthesia was provided with 1% Lidocaine. A micropuncture needle was used access the right internal jugular vein under ultrasound. With excellent venous blood flow returned, and an .018 micro wire was passed through the needle. Small incision was made with an 11 blade scalpel. The needle was removed, and a micropuncture sheath was placed over the wire. The inner dilator and wire were removed, and an 035 Bentson wire was advanced under fluoroscopy into the IVC. Serial dilation of the soft tissue tract was performed with an 8 [place] dilator and subsequently a 10  [place] dilator. The delivery sheath for a retrievable Bard Denali filter was passed over the Bentson wire into the IVC. The wire was removed and small contrast was used to confirm IVC location. IVC cavagram performed. Dilator was removed, and the IVC filter was then delivered, positioned below the lowest renal vein, with the tip of the filter projecting into the flow stream of the right renal vein. Repeat cavagram performed, and the catheter was removed. Manual pressure was used for hemostasis. Patient tolerated the procedure well and remained hemodynamically stable throughout. No complications were encountered and no significant blood loss was encounter. IMPRESSION: Status post ultrasound-guided access right internal jugular vein and placement of retrievable IVC filter. Signed, Dulcy Fanny. Dellia Nims, RPVI [facility] Radiology PLAN: This IVC filter is potentially retrievable. The patient can be assessed for assessed for filter retrieval by Interventional Radiology in approximately 8-12 weeks. Further recommendations regarding filter retrieval, continued surveillance or  declaration of device permanence, will be made at that time. Electronically Signed   By: Corrie Mckusick D.O.   On: 09/02/2019 17:14   DG CHEST PORT 1 VIEW  Result Date: 09/02/2019 CLINICAL DATA:  Central line placement EXAM: PORTABLE CHEST 1 VIEW COMPARISON:  None. FINDINGS: Bilateral interstitial thickening. No pleural effusion or pneumothorax. Stable cardiomegaly. Nasogastric tube coursing below the diaphragm. Left subclavian central venous catheter with the tip projecting over the SVC. IMPRESSION: Left subclavian central venous catheter with the tip projecting over the SVC. Cardiomegaly with pulmonary vascular congestion. Electronically Signed   By: Kathreen Devoid   On: 09/02/2019 12:43   EEG adult  Result Date: 09/02/2019 Alexis Goodell, MD     09/02/2019  6:21 PM ELECTROENCEPHALOGRAM REPORT Patient: Haley Mayo       Room #: 4N17C EEG No. ID: [id number] Age: 69 y.o.        Sex: female Requesting Physician: Kathyrn Sheriff Report Date:  09/02/2019       Interpreting Physician: Alexis Goodell History: EARLYN VIRRUETA is an 69 y.o. female with altered mental status Medications: ASA, Rocephin, Plavix, Nimotop, Levophed, Neosynephrine Conditions of Recording:  This is a 21 channel routine scalp EEG performed with bipolar and monopolar montages arranged in accordance to the international 10/20 system of electrode placement. One channel was dedicated to EKG recording. The patient is in the altered state. Description:  The background activity is slow and poorly organized.  It consists of low voltage activity in the delta theta continuum.  This activity is continuous and diffusely distributed. No epileptiform activity is noted. Hyperventilation and intermittent photic stimulation were not performed. IMPRESSION: This is an abnormal EEG secondary to general background slowing.  This finding may be seen with a diffuse disturbance that is etiologically nonspecific, but may include a metabolic encephalopathy, among  other possibilities.  No epileptiform activity was noted.  Alexis Goodell, MD Neurology [phone] 09/02/2019, 6:12 PM   ECHOCARDIOGRAM COMPLETE  Result Date: 09/03/2019    ECHOCARDIOGRAM REPORT   Patient Name:   Haley Mayo Date of Exam: 09/03/2019 Medical Rec #:  [id number]           Height:       60.0 in Accession #:    [id number]          Weight:       160.9 lb Date of Birth:  01-May-1950          BSA:          1.702 m Patient Age:  68 years            BP:           165/75 mmHg Patient Gender: F                   HR:           80 bpm. Exam Location:  Inpatient Procedure: 2D Echo, Cardiac Doppler and Color Doppler Indications:    I26.02 Pulmonary embolus  History:        Patient has prior history of Echocardiogram examinations, most                 recent 08/29/2019. Risk Factors:Hypertension and Dyslipidemia.  Sonographer:    Jonelle Sidle Dance Referring Phys: [id number] PAULA B SIMPSON  Sonographer Comments: Technically difficult study due to poor echo windows. IMPRESSIONS  1. Left ventricular ejection fraction, by estimation, is 65 to 70%. The left ventricle has normal function. The left ventricle has no regional wall motion abnormalities. Left ventricular diastolic parameters are consistent with Grade I diastolic dysfunction (impaired relaxation).  2. Right ventricular systolic function is normal. The right ventricular size is mildly enlarged. Tricuspid regurgitation signal is inadequate for assessing PA pressure.  3. The mitral valve is normal in structure. No evidence of mitral valve regurgitation. No evidence of mitral stenosis.  4. The aortic valve is grossly normal. Aortic valve regurgitation is not visualized. Mild aortic valve sclerosis is present, with no evidence of aortic valve stenosis.  5. Increased flow velocities across valves may be secondary to anemia, thyrotoxicosis, hyperdynamic ventricle or high flow state.  6. The inferior vena cava is dilated in size with >50% respiratory variability,  suggesting right atrial pressure of 8 mmHg. FINDINGS  Left Ventricle: Left ventricular ejection fraction, by estimation, is 65 to 70%. The left ventricle has normal function. The left ventricle has no regional wall motion abnormalities. The left ventricular internal cavity size was normal in size. There is  no left ventricular hypertrophy. Left ventricular diastolic parameters are consistent with Grade I diastolic dysfunction (impaired relaxation). Right Ventricle: The right ventricular size is mildly enlarged. No increase in right ventricular wall thickness. Right ventricular systolic function is normal. Tricuspid regurgitation signal is inadequate for assessing PA pressure. Left Atrium: Left atrial size was normal in size. Right Atrium: Right atrial size was normal in size. Pericardium: Trivial pericardial effusion is present. Mitral Valve: The mitral valve is normal in structure. Normal mobility of the mitral valve leaflets. No evidence of mitral valve regurgitation. No evidence of mitral valve stenosis. Tricuspid Valve: The tricuspid valve is normal in structure. Tricuspid valve regurgitation is trivial. No evidence of tricuspid stenosis. Aortic Valve: The aortic valve is grossly normal. Aortic valve regurgitation is not visualized. Mild aortic valve sclerosis is present, with no evidence of aortic valve stenosis. Aortic valve mean gradient measures 12.7 mmHg. Aortic valve peak gradient measures 23.6 mmHg. Aortic valve area, by VTI measures 2.47 cm. Pulmonic Valve: The pulmonic valve was normal in structure. Pulmonic valve regurgitation is not visualized. No evidence of pulmonic stenosis. Aorta: The aortic root is normal in size and structure. Venous: The inferior vena cava is dilated in size with greater than 50% respiratory variability, suggesting right atrial pressure of 8 mmHg. IAS/Shunts: No atrial level shunt detected by color flow Doppler.  LEFT VENTRICLE PLAX 2D LVIDd:         4.40 cm  Diastology LVIDs:          3.20 cm  LV e'  lateral:   6.64 cm/s LV PW:         1.20 cm  LV E/e' lateral: 15.2 LV IVS:        0.90 cm  LV e' medial:    5.55 cm/s LVOT diam:     2.10 cm  LV E/e' medial:  18.2 LV SV:         111 LV SV Index:   65 LVOT Area:     3.46 cm  RIGHT VENTRICLE             IVC RV Basal diam:  3.20 cm     IVC diam: 2.30 cm RV Mid diam:    2.40 cm RV S prime:     14.00 cm/s TAPSE (M-mode): 1.9 cm LEFT ATRIUM             Index LA diam:        3.20 cm 1.88 cm/m LA Vol (A2C):   51.0 ml 29.97 ml/m LA Vol (A4C):   45.8 ml 26.91 ml/m LA Biplane Vol: 48.7 ml 28.61 ml/m  AORTIC VALVE AV Area (Vmax):    2.30 cm AV Area (Vmean):   2.04 cm AV Area (VTI):     2.47 cm AV Vmax:           242.67 cm/s AV Vmean:          161.667 cm/s AV VTI:            0.449 m AV Peak Grad:      23.6 mmHg AV Mean Grad:      12.7 mmHg LVOT Vmax:         161.00 cm/s LVOT Vmean:        95.000 cm/s LVOT VTI:          0.320 m LVOT/AV VTI ratio: 0.71  AORTA Ao Root diam: 3.00 cm Ao Asc diam:  3.10 cm MITRAL VALVE MV Area (PHT): 2.80 cm     SHUNTS MV Decel Time: 271 msec     Systemic VTI:  0.32 m MV E velocity: 101.00 cm/s  Systemic Diam: 2.10 cm MV A velocity: 108.00 cm/s MV E/A ratio:  0.94 Cherlynn Kaiser MD Electronically signed by Cherlynn Kaiser MD Signature Date/Time: 09/03/2019/12:30:30 PM    Final    VAS [place] TRANSCRANIAL DOPPLER  Result Date: 09/03/2019  Transcranial Doppler Indications: Subarachnoid hemorrhage. History: Basilar artery aneurysm. History of hypertension, hyperlipidemia and smoking. Limitations for diagnostic windows: Unable to insonate right transtemporal window. Unable to insonate left transtemporal window. Performing Technologist: Oda Cogan RDMS, RVT  Examination Guidelines: A complete evaluation includes B-mode imaging, spectral Doppler, color Doppler, and power Doppler as needed of all accessible portions of each vessel. Bilateral testing is considered an integral part of a complete examination. Limited  examinations for reoccurring indications may be performed as noted.  +----------+-------------+----------+-----------+---------+ RIGHT TCD Right VM (cm)Depth (cm)Pulsatility Comment  +----------+-------------+----------+-----------+---------+ MCA                                         no window +----------+-------------+----------+-----------+---------+ ACA                                         no window +----------+-------------+----------+-----------+---------+ Term ICA  no window +----------+-------------+----------+-----------+---------+ PCA                                         no window +----------+-------------+----------+-----------+---------+ Opthalmic     65.00                  2.2              +----------+-------------+----------+-----------+---------+ ICA siphon    88.00                  101              +----------+-------------+----------+-----------+---------+ Vertebral    -55.00                                   +----------+-------------+----------+-----------+---------+  +----------+------------+----------+-----------+---------+ LEFT TCD  Left VM (cm)Depth (cm)Pulsatility Comment  +----------+------------+----------+-----------+---------+ MCA                                        no window +----------+------------+----------+-----------+---------+ ACA                                        no window +----------+------------+----------+-----------+---------+ Term ICA                                   no window +----------+------------+----------+-----------+---------+ PCA                                        no window +----------+------------+----------+-----------+---------+ Opthalmic    43.00                  2.2              +----------+------------+----------+-----------+---------+ ICA siphon   103.00     65.00       1.5               +----------+------------+----------+-----------+---------+ Vertebral    -31.00                                  +----------+------------+----------+-----------+---------+  +------------+-------+-------------+             VM cm/s   Comment    +------------+-------+-------------+ Prox Basilar-84.00               +------------+-------+-------------+ Dist Basilar       Not insonated +------------+-------+-------------+ Summary:  Limited study due to absent bitemporal and pooor suboccipital window. Elevated bilateral carotid siphon mean flow velocities may suggest mild vasospasm. Mildy elevatedl mean flow velocities in b both opthalmic artries of unclear significance *See table(s) above for TCD measurements and observations.  Diagnosing physician: Antony Contras MD Electronically signed by Antony Contras MD on 09/03/2019 at 8:20:42 AM.    Final    Assessment/Plan: Diagnosis: Subarachnoid hemorrhage 1. Does the need for close, 24 hr/day medical supervision in concert with the patient's rehab needs make it unreasonable for this patient to be served  in a less intensive setting? Yes 2. Co-Morbidities requiring supervision/potential complications: HLD, leukocytosis, essential HTN, smoking, headache, diplopia, dysphagia 3. Due to bladder management, bowel management, safety, skin/wound care, disease management, medication administration, pain management and patient education, does the patient require 24 hr/day rehab nursing? Yes 4. Does the patient require coordinated care of a physician, rehab nurse, therapy disciplines of PT, OT, SLP to address physical and functional deficits in the context of the above medical diagnosis(es)? Yes Addressing deficits in the following areas: balance, endurance, locomotion, strength, transferring, bowel/bladder control, bathing, dressing, feeding, grooming, toileting, cognition, speech, language, swallowing and psychosocial support 5. Can the patient actively participate  in an intensive therapy program of at least 3 hrs of therapy per day at least 5 days per week? Yes 6. The potential for patient to make measurable gains while on inpatient rehab is excellent 7. Anticipated functional outcomes upon discharge from inpatient rehab are min assist  with PT, min assist with OT, modified independent/Swith SLP. 8. Estimated rehab length of stay to reach the above functional goals is: 16-20 days 9. Anticipated discharge destination: Home 10. Overall Rehab/Functional Prognosis: excellent  RECOMMENDATIONS: This patient's condition is appropriate for continued rehabilitative care in the following setting: CIR Patient has agreed to participate in recommended program. Yes Note that insurance prior authorization may be required for reimbursement for recommended care.  Comment: Mrs. Langness would be an excellent CIR candidate once medically stable (currently requiring ICU monitoring and phenylephrine drip) and if family support can be confirmed (patient states she lives alone). Thank you for this consult. We will continue to follow in Mrs. Wetsel's care.   Lavon Paganini Angiulli, PA-C 09/04/2019   I have personally performed a face to face diagnostic evaluation, including, but not limited to relevant history and physical exam findings, of this patient and developed relevant assessment and plan.  Additionally, I have reviewed and concur with the physician assistant's documentation above.  Leeroy Cha, MD

## 2019-09-04 NOTE — Progress Notes (Signed)
  Speech Language Pathology Treatment: Cognitive-Linquistic  Patient Details Name: Haley Mayo MRN: [id number] DOB: 07/05/1950 Today's Date: 09/04/2019 Time: [id number] SLP Time Calculation (min) (ACUTE ONLY): 9 min  Assessment / Plan / Recommendation Clinical Impression  Pt is oriented x3, needing Min cues to redirect to basic situation. SLP also provided Min cues for simple, one-step commands. She has delayed processing, at times responding faster than others, with increased difficulty answering questions or following commands correctly the more time that passes from initial prompt. She has reduced working memory and simple problem solving. Recommend CIR-level follow up for cognitive goals if assist can be available at home after discharge.    HPI HPI: Pt is 69 yo female that presented to ED for generalized weakness, pt stated she has been down on the floor at home since 5/7. Reported recent [place] vaccination (5/3). PMH of HLD, HTN. On 5/16 pt with c/o diplopia. CT of head showed SAH 60mm basilar tip aneurysm.  Pt transferred to [facility] for neurosurgery consult. Pt underwent diagnostic cerebral angiogram with stent-supported coil embolization of basila aneurysm on 5/18. Pt underwent IVC filter placement on 5/19.      SLP Plan  Continue with current plan of care       Recommendations  Medication Administration: Whole meds with puree Compensations: Slow rate;Small sips/bites                Oral Care Recommendations: Oral care BID Follow up Recommendations: Inpatient Rehab SLP Visit Diagnosis: Cognitive communication deficit [id number]) Plan: Continue with current plan of care       GO                 Osie Bond., M.A. [place] [facility] Pager [phone] Office [phone]  09/04/2019, 11:03 AM

## 2019-09-04 NOTE — Evaluation (Signed)
Clinical/Bedside Swallow Evaluation Patient Details  Name: Haley Mayo MRN: [id number] Date of Birth: December 04, 1950  Today's Date: 09/04/2019 Time: SLP Start Time (ACUTE ONLY): 1034 SLP Stop Time (ACUTE ONLY): 1042 SLP Time Calculation (min) (ACUTE ONLY): 8 min  Past Medical History:  Past Medical History:  Diagnosis Date  . HLD (hyperlipidemia)   . HTN (hypertension)    controlled  . Leukocytosis    ?   Marland Kitchen Neoplasm of ear    left ear lobe  . Obesity   . Wears dentures    upper   Past Surgical History:  Past Surgical History:  Procedure Laterality Date  . ABDOMINAL HYSTERECTOMY  Age 83  . COLONOSCOPY  09/2004   normal, rpt 10 yrs  . EAR CYST EXCISION Left 09/27/2017   Procedure: EXCISION EAR LOBE CYST;  Surgeon: Beverly Gust, MD;  Location: [place];  Service: ENT;  Laterality: Left;  LOCAL ONLY  . IR IVC FILTER PLMT / S&I /IMG GUID/MOD SED  09/02/2019  . OOPHORECTOMY    . RADIOLOGY WITH ANESTHESIA N/A 09/01/2019   Procedure: IR WITH ANESTHESIA;  Surgeon: Consuella Lose, MD;  Location: [place];  Service: Radiology;  Laterality: N/A;  . TOTAL VAGINAL HYSTERECTOMY  05/2001   heavy bleeding, fibroids   HPI:  Pt is 69 yo female that presented to ED for generalized weakness, pt stated she has been down on the floor at home since 5/7. Reported recent [place] vaccination (5/3). PMH of HLD, HTN. On 5/16 pt with c/o diplopia. CT of head showed SAH 36mm basilar tip aneurysm.  Pt transferred to [facility] for neurosurgery consult. Pt underwent diagnostic cerebral angiogram with stent-supported coil embolization of basila aneurysm on 5/18. Pt underwent IVC filter placement on 5/19.   Assessment / Plan / Recommendation Clinical Impression   Pt has functional appearing swallow with the exception of slow mastication in light of missing dentition and altered mentation. She has generalized weakness but no overt asymmetry, and she clears her mouth well given extra time. There was a  single delayed cough noted across all PO trials, but otherwise no overt signs concerning for aspiration are noted. Recommend starting Dys 2 (chopped) diet and thin liquids. SLP will f/u for potential to advance as her mentation clears.    SLP Visit Diagnosis: Dysphagia, unspecified (R13.10)    Aspiration Risk  Mild aspiration risk    Diet Recommendation Dysphagia 2 (Fine chop);Thin liquid   Liquid Administration via: Cup;Straw Medication Administration: Whole meds with puree Supervision: Staff to assist with self feeding;Full supervision/cueing for compensatory strategies Compensations: Slow rate;Small sips/bites Postural Changes: Seated upright at 90 degrees    Other  Recommendations Oral Care Recommendations: Oral care BID   Follow up Recommendations Inpatient Rehab      Frequency and Duration min 2x/week  2 weeks       Prognosis Prognosis for Safe Diet Advancement: Good Barriers to Reach Goals: Cognitive deficits      Swallow Study   General HPI: Pt is 69 yo female that presented to ED for generalized weakness, pt stated she has been down on the floor at home since 5/7. Reported recent [place] vaccination (5/3). PMH of HLD, HTN. On 5/16 pt with c/o diplopia. CT of head showed SAH 23mm basilar tip aneurysm.  Pt transferred to [facility] for neurosurgery consult. Pt underwent diagnostic cerebral angiogram with stent-supported coil embolization of basila aneurysm on 5/18. Pt underwent IVC filter placement on 5/19. Type of Study: Bedside Swallow Evaluation Previous Swallow Assessment:  none in chart Diet Prior to this Study: NPO;NG Tube Temperature Spikes Noted: No Respiratory Status: Nasal cannula History of Recent Intubation: No Behavior/Cognition: Alert;Cooperative;Requires cueing Oral Cavity Assessment: Within Functional Limits Oral Care Completed by SLP: No Oral Cavity - Dentition: Missing dentition Vision: Functional for self-feeding Self-Feeding Abilities: Needs assist Patient  Positioning: Upright in bed Baseline Vocal Quality: Normal Volitional Cough: Weak Volitional Swallow: Able to elicit    Oral/Motor/Sensory Function Overall Oral Motor/Sensory Function: Generalized oral weakness   Ice Chips Ice chips: Not tested   Thin Liquid Thin Liquid: Impaired Presentation: Straw Pharyngeal  Phase Impairments: Cough - Delayed(x1)    Nectar Thick Nectar Thick Liquid: Not tested   Honey Thick Honey Thick Liquid: Not tested   Puree Puree: Within functional limits Presentation: Spoon   Solid     Solid: Impaired Oral Phase Functional Implications: Impaired mastication       Osie Bond., M.A. [place] [facility] Pager [phone] Office [phone]  09/04/2019,10:59 AM

## 2019-09-04 NOTE — Progress Notes (Addendum)
NAME:  Haley Mayo, MRN:  [id number], DOB:  [birth year], LOS: 4 ADMISSION DATE:  08/31/2019, CONSULTATION DATE:  09/01/2019 REFERRING MD:  Dr. Kathyrn Sheriff, CHIEF COMPLAINT:  SAH/ PE  Brief History   69 year old female initially admitted at [facility] for CAP on 5/12 who developed headache and diplopia found to have a [place].  Transferred to [facility] for further neurosurgical care.  Incidently on CTA found to have PE.      Past Medical History  HTN. Tobacco abuse   Significant Hospital Events   5/12 admitted [facility] with CAP 5/16 overnight transferred to [facility] 5/18 Stent and coiling of fusiform basilar aneurysm. 5/19 - started hemodynamic 3 vessel vasospasm.  5/19 - IVC filter placement 5/21 awake, oriented. Moving all ext. Doing well. Perhaps small right drift Significant Diagnostic Tests:   5/16 [facility] >>  Aneurysmal pattern subarachnoid hemorrhage over both convexities and extending into the ventricles and basal cisterns. 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 CTA of the head recommended for better characterization.  5/16 CTA head/ neck >> 1. Unchanged distribution of subarachnoid hemorrhage. 2. 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 3. Right upper lobar pulmonary artery embolus, incompletely visualized. 4. Aortic Atherosclerosis  2.3 cm right thyroid nodule  5/16 CTA PE >> 1. Filling defect within the proximal right upper lobe apical and posterior segmental arteries, consistent with pulmonary embolus. 2. Enlarged main pulmonary artery, consistent with pulmonary hypertension. 3. Moderate cardiomegaly. 4. Aortic Atherosclerosis (ICD10-I70.0). 5. 2.3 cm right thyroid heterogeneous nodule.   5/17 TCDs 5/17 b/l LE doppler study >> limited study, no evidence of DVT   5/18 CTA: continued severe spasm of bilateral A1/A2, M1/M2, and basilar arteries. Stent construct appears patent with spasm involving PCA as well. No new hemorrhage or HCP  Micro Data:    5/12 SARS 2 >> neg 5/14 UC >> neg 5/14 BC [facility]) >>  5/16 BCx 2 >> ngtd  Antimicrobials:  5/12 azithro >> 5/16 5/12 ceftriaxone >> 5/15 5/16 cefepime >> 5/17 5/16 vanc  Interim history/subjective:  Fully awake. No distress.   Objective   Blood pressure 96/78, pulse 79, temperature 98.7 F (37.1 C), temperature source Oral, resp. rate 19, weight 73 kg, SpO2 95 %. CVP:  [7 mmHg-16 mmHg] 7 mmHg      Intake/Output Summary (Last 24 hours) at 09/04/2019 0834 Last data filed at 09/04/2019 0700 Gross per 24 hour  Intake 5192.24 ml  Output 7150 ml  Net -1957.76 ml   Filed Weights   09/03/19 0500 09/04/19 0500  Weight: 73 kg 73 kg    Examination: General this is a 69 year old female. Resting in bed. No distress.  HENT NCAT no JVD MMM PERRL pulm scattered rhonchi. No accessory use currently on 4-5 liters Card RRR no MRG abd not tender + bowel sounds Ext brisk CR trace LE edema strong pulses Neuro awake oriented. Trace right drift  GU clear yellow   Resolved Hospital Problem list    Assessment & Plan:   Critically ill due to cerebral vasospasm from aneurysmal SAH requiring initiation of vasopressors and fluid resuscitation  Plan Target MAP 110-115 OR SBP >180 Swallow eval today  Cont nimodipine Serial TCDs Serial neuro checks  Keep euvolemic  Cont phenylephrine to assume care    RUL PE due to immobility following SAH. No clearly associated pneumonia.  - very sedentary at baseline, hx of tobacco abuse, and obesity are risk factors  for provoked PE   Plan Supplemental oxygen to keeps sats > 92% Pulse ox Mobilization  IS Has IVC filter. 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 - Continue DAPT for stent Plan Asa and plavix for stent  HTN  Plan Holding antihypertensives for [place] therapy   Leukocytosis  ? reactive no fever spikes; recent completion of CAP   Plan Trend cbc  Incidental right thyroid nodule  Plan Out pt [place] +/- FNA  Fluid and electrolyte imbalance: Hypokalemia Plan Cont to replace and recheck as indicated .  Hyperglycemia Plan ssi   Daily Goals Checklist  Pain/Anxiety/Delirium protocol (if indicated): prn only Neuro vitals: every 1 hours AED's: none VAP protocol (if indicated): not intubated Respiratory support goals: HOB 45  Blood pressure target: MAP 110-115 as significant whip to arterial line makes Systolic less reliable.  DVT prophylaxis: heparin tid Nutrition Status: initiated tube feeds GI prophylaxis: protonix Fluid status goals: CVP monitoring, keep euvolemic Urinary catheter: Guide hemodynamic management Central lines: left [place] triple lumen Glucose control: SSI insulin.  Mobility/therapy needs: bedrest  Antibiotic de-escalation: complete 7 days for CAP vs Aspiration  Home medication reconciliation: on hold Daily labs: BMET bid, CBC daily Code Status: full Family Communication:  Updated son 5/19 Disposition: ICU   Labs   CBC: Recent Labs  Lab 08/29/19 0439 08/30/19 0702 08/31/19 0321 09/02/19 0946 09/04/19 0710  WBC 18.2* 20.4* 21.5* 18.5* 23.7*  HGB 13.8 13.9 13.7 10.9* 12.1  HCT 39.6 41.5 41.8 33.3* 36.2  MCV 80.8 81.4 83.6 84.1 83.2  PLT 251 266 283 310 0000000    Basic Metabolic Panel: Recent Labs  Lab 08/31/19 0321 08/31/19 0321 09/02/19 0946 09/02/19 1730 09/03/19 0650 09/03/19 1545 09/04/19 0710  NA 135  --   --  140 142 140 139  K 3.4*  --   --  2.0* 2.2* 3.1* 2.7*  CL 103  --   --  105 108 103 100  CO2 24  --   --  24 25 25 29   GLUCOSE 199*  --   --  228* 193* 206* 173*  BUN 10  --   --  8 9 7* 7*  CREATININE 0.70   < > 0.62 0.55 0.46 0.40* 0.44  CALCIUM 8.8*  --   --  7.7* 7.6* 7.7* 8.3*  MG  --   --  1.5* 2.3 1.7 2.5*  --   PHOS  --   --  2.2* 1.8* 1.6* 3.1  --    < > = values in this interval not displayed.   GFR: Estimated Creatinine Clearance: 60.9 mL/min (by  C-G formula based on SCr of 0.44 mg/dL). Recent Labs  Lab 08/30/19 0702 08/31/19 0321 09/02/19 0946 09/04/19 0710  WBC 20.4* 21.5* 18.5* 23.7*    Liver Function Tests: Recent Labs  Lab 08/30/19 0702  AST 21  ALT 39  ALKPHOS 68  BILITOT 0.7  PROT 7.1  ALBUMIN 3.1*   No results for input(s): LIPASE, AMYLASE in the last 168 hours. No results for input(s): AMMONIA in the last 168 hours.  ABG No results found for: PHART, PCO2ART, PO2ART, HCO3, TCO2, ACIDBASEDEF, O2SAT   Coagulation Profile: Recent Labs  Lab 08/31/19 0321  INR 1.1    Cardiac Enzymes: Recent Labs  Lab 08/29/19 0439  CKTOTAL 366*    HbA1C: Hgb A1c MFr Bld  Date/Time Value Ref Range  Status  09/02/2019 09:46 AM 6.7 (H) 4.8 - 5.6 % Final    Comment:    (NOTE) Pre diabetes:          5.7%-6.4% Diabetes:              >6.4% Glycemic control for   <7.0% adults with diabetes   01/25/2016 10:20 AM 6.4 (H) 4.8 - 5.6 % Final    Comment:             Pre-diabetes: 5.7 - 6.4          Diabetes: >6.4          Glycemic control for adults with diabetes: <7.0     CBG: Recent Labs  Lab 09/03/19 1527 09/03/19 1920 09/03/19 2305 09/04/19 0309 09/04/19 0734  GLUCAP 176* 132* 176* 178* 178*   CRITICAL CARE  33 min Erick Colace ACNP-BC [place] Pager # [phone] OR # [phone] if no answer   09/04/2019, 8:34 AM

## 2019-09-04 NOTE — Progress Notes (Signed)
Transcranial Doppler  Date POD PCO2 HCT BP  MCA ACA PCA OPHT SIPH VERT Basilar  5/17 GC     Right  Left   *  *   *  *   *  *   12  25   32  55   -43  -29   *      5/19,rs     Right  Left   *  *   *  *   *  *   65  43   88  103   -55  -31   -84      5/21 GC     Right  Left   *  *   *  *   *  *   13  22   23  24    *  *   *            Right  Left                                             Right  Left                                            Right  Left                                            Right  Left                                        MCA = Middle Cerebral Artery      OPHT = Opthalmic Artery     BASILAR = Basilar Artery   ACA = Anterior Cerebral Artery     SIPH = Carotid Siphon PCA = Posterior Cerebral Artery   VERT = Verterbral Artery                   Normal MCA = 62+\-12 ACA = 50+\-12 PCA = 42+\-23   (*) no window  - not insonated.   09/04/19 1:28 PM Carlos Levering RVT

## 2019-09-04 NOTE — Progress Notes (Signed)
Discussed foley with CCM team. Order to keep foley obtained. Indications: retention, [place] therapy, fecal incontinence with stage two skin breakdown. Central line must be kept for quad strength neo.

## 2019-09-04 NOTE — Progress Notes (Signed)
K 2.7. MD Agarwala notified.

## 2019-09-05 DIAGNOSIS — L8990 Pressure ulcer of unspecified site, unspecified stage: Secondary | ICD-10-CM | POA: Insufficient documentation

## 2019-09-05 LAB — CBC
HCT: 37.7 % (ref 36.0–46.0)
Hemoglobin: 12.4 g/dL (ref 12.0–15.0)
MCH: 27.7 pg (ref 26.0–34.0)
MCHC: 32.9 g/dL (ref 30.0–36.0)
MCV: 84.3 fL (ref 80.0–100.0)
Platelets: 394 10*3/uL (ref 150–400)
RBC: 4.47 MIL/uL (ref 3.87–5.11)
RDW: 15 % (ref 11.5–15.5)
WBC: 26.7 10*3/uL — ABNORMAL HIGH (ref 4.0–10.5)
nRBC: 0 % (ref 0.0–0.2)

## 2019-09-05 LAB — GLUCOSE, CAPILLARY
Glucose-Capillary: 182 mg/dL — ABNORMAL HIGH (ref 70–99)
Glucose-Capillary: 163 mg/dL — ABNORMAL HIGH (ref 70–99)
Glucose-Capillary: 172 mg/dL — ABNORMAL HIGH (ref 70–99)
Glucose-Capillary: 148 mg/dL — ABNORMAL HIGH (ref 70–99)
Glucose-Capillary: 146 mg/dL — ABNORMAL HIGH (ref 70–99)
Glucose-Capillary: 203 mg/dL — ABNORMAL HIGH (ref 70–99)

## 2019-09-05 NOTE — Progress Notes (Signed)
Physical Therapy Treatment Patient Details Name: Haley Mayo MRN: [id number] DOB: 19-Apr-1950 Today's Date: 09/05/2019    History of Present Illness Pt is 69 yo female that presented to ED for generalized weakness, pt stated she has been down on the floor at home since 5/7. Reported recent Gage vaccination (5/3). PMH of HLD, HTN. 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  Pt transferred to [facility] for neurosurgery consult. Pt underwent diagnostic cerebral angiogram with stent-supported coil embolization of basila aneurysm on 5/18. +PE. Pt underwent IVC filter placement on 5/19.    PT Comments    Pt tolerated treatment well despite reports of dizziness after completing transfers, BP stable and possible related to diplopia. Pt continues to require significant assistance to perform all functional mobility, with general weakness and impaired sitting and standing balance placing her at a high falls risk. Pt is unable to scoot herself forward or backward in bed or recliner and requires maxA to complete these tasks. Pt's cognition remains slowed and the patient continues to require re-orientation to situation of being found down at home for an unknown period of time. Pt will continue to benefit from acute PT POC and aggressive mobilization to improve balance and functional mobility while also reducing falls risk. PT continues to recommend CIR at this time.  Follow Up Recommendations  CIR;Supervision/Assistance - 24 hour     Equipment Recommendations  Wheelchair (measurements PT);Wheelchair cushion (measurements PT);Hospital bed;Other (comment)    Recommendations for Other Services       Precautions / Restrictions Precautions Precautions: Fall Precaution Comments: R visual field diplopia, improved with taped glasses Restrictions Weight Bearing Restrictions: No    Mobility  Bed Mobility Overal bed mobility: Needs Assistance Bed Mobility: Supine to Sit      Supine to sit: Mod assist     General bed mobility comments: pt requires significant assistance to turn hips toward edge of bed, assistance to pull up through PT UE support. Pt requires significant assistance to also scoot hips toward back of recliner once completing transfer  Transfers Overall transfer level: Needs assistance Equipment used: 1 person hand held assist Transfers: Sit to/from [employer] Transfers Sit to Stand: Max assist Stand pivot transfers: Max assist          Ambulation/Gait                 Stairs             Wheelchair Mobility    Modified Rankin (Stroke Patients Only) Modified Rankin (Stroke Patients Only) Pre-Morbid Rankin Score: No significant disability(unsure of baseline) Modified Rankin: Moderately severe disability     Balance Overall balance assessment: Needs assistance Sitting-balance support: Bilateral upper extremity supported;Feet unsupported Sitting balance-Leahy Scale: Poor Sitting balance - Comments: modA due to posterior lean Postural control: Posterior lean Standing balance support: Bilateral upper extremity supported Standing balance-Leahy Scale: Zero Standing balance comment: maxA to maintain standing balance                            Cognition Arousal/Alertness: Awake/alert(intermittent lethargy) Behavior During Therapy: Flat affect Overall Cognitive Status: Impaired/Different from baseline Area of Impairment: Orientation;Attention;Memory;Following commands;Safety/judgement;Awareness;Problem solving                 Orientation Level: Disoriented to;Situation Current Attention Level: Sustained Memory: Decreased recall of precautions;Decreased short-term memory Following Commands: Follows one step commands with increased time Safety/Judgement: Decreased awareness of safety;Decreased  awareness of deficits Awareness: Emergent Problem Solving: Slow processing;Requires verbal  cues;Requires tactile cues        Exercises      General Comments General comments (skin integrity, edema, etc.): pt on RA upon arrival with nasal canula running next to her in bed, saturaton in low 90s. Pt BP stable although reporting dizziness after transfer to recliner. PT replaces nasal canula at end of session. RN made aware that patient with leakage from flexiseal prior to mobilizing.      Pertinent Vitals/Pain Pain Assessment: Faces Faces Pain Scale: Hurts whole lot Pain Location: buttocks Pain Descriptors / Indicators: Grimacing Pain Intervention(s): Monitored during session    Home Living                      Prior Function            PT Goals (current goals can now be found in the care plan section) Acute Rehab PT Goals Patient Stated Goal: to improve mobility Progress towards PT goals: Progressing toward goals(slowly)    Frequency    Min 4X/week      PT Plan Current plan remains appropriate    Co-evaluation              AM-PAC PT "6 Clicks" Mobility   Outcome Measure  Help needed turning from your back to your side while in a flat bed without using bedrails?: A Lot Help needed moving from lying on your back to sitting on the side of a flat bed without using bedrails?: A Lot Help needed moving to and from a bed to a chair (including a wheelchair)?: Total Help needed standing up from a chair using your arms (e.g., wheelchair or bedside chair)?: Total Help needed to walk in hospital room?: Total Help needed climbing 3-5 steps with a railing? : Total 6 Click Score: 8    End of Session Equipment Utilized During Treatment: Oxygen Activity Tolerance: Patient tolerated treatment well Patient left: in chair;with call bell/phone within reach;with chair alarm set Nurse Communication: Mobility status;Need for lift equipment PT Visit Diagnosis: Other abnormalities of gait and mobility (R26.89);Muscle weakness (generalized) (M62.81);Other symptoms  and signs involving the nervous system (R29.898)     Time: [id number] PT Time Calculation (min) (ACUTE ONLY): 38 min  Charges:  $Therapeutic Activity: 38-52 mins                     Zenaida Niece, PT, DPT Acute Rehabilitation Pager: [phone]    Zenaida Niece 09/05/2019, 3:33 PM

## 2019-09-05 NOTE — Progress Notes (Signed)
NAME:  Haley Mayo, MRN:  [id number], DOB:  1951/02/02, LOS: 5 ADMISSION DATE:  08/31/2019, CONSULTATION DATE:  09/01/2019 REFERRING MD:  Dr. Kathyrn Sheriff, CHIEF COMPLAINT:  SAH/ PE  Brief History   69 year old female initially admitted at [facility] for CAP on 5/12 who developed headache and diplopia found to have a [place].  Transferred to [facility] for further neurosurgical care.  Incidently on CTA found to have PE.    Past Medical History  HTN. 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 5/19 - started hemodynamic 3 vessel vasospasm.  5/19 - IVC filter placement 5/21 awake, oriented. Moving all ext. Doing well. Perhaps small right drift Significant Diagnostic Tests:   5/16 [facility] >>  Aneurysmal pattern subarachnoid hemorrhage over both convexities and extending into the ventricles and basal cisterns. 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 CTA of the head recommended for better characterization.  5/16 CTA head/ neck >> 1. Unchanged distribution of subarachnoid hemorrhage. 2. 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 3. Right upper lobar pulmonary artery embolus, incompletely visualized. 4. Aortic Atherosclerosis  2.3 cm right thyroid nodule  5/16 CTA PE >> 1. Filling defect within the proximal right upper lobe apical and posterior segmental arteries, consistent with pulmonary embolus. 2. Enlarged main pulmonary artery, consistent with pulmonary hypertension. 3. Moderate cardiomegaly. 4. Aortic Atherosclerosis (ICD10-I70.0). 5. 2.3 cm right thyroid heterogeneous nodule.   5/17 TCDs 5/17 b/l LE doppler study >> limited study, no evidence of DVT   5/18 CTA: continued severe spasm of bilateral A1/A2, M1/M2, and basilar arteries. Stent construct appears patent with spasm involving PCA as well. No new hemorrhage or HCP  Micro Data:  5/12  SARS 2 >> neg 5/14 UC >> neg 5/14 BC [facility]) >>  5/16 BCx 2 >> ngtd  Antimicrobials:  5/12 azithro >> 5/16 5/12 ceftriaxone >> 5/15 5/16 cefepime >> 5/17 5/16 vanc  Interim history/subjective:  Awake but responses are slow at times. No distress.   Objective   Blood pressure (!) 144/72, pulse 78, temperature 99.6 F (37.6 C), temperature source Oral, resp. rate (!) 23, weight 73 kg, SpO2 99 %. CVP:  [8 mmHg] 8 mmHg      Intake/Output Summary (Last 24 hours) at 09/05/2019 1152 Last data filed at 09/05/2019 1100 Gross per 24 hour  Intake 4236.9 ml  Output 7050 ml  Net -2813.1 ml   Filed Weights   09/03/19 0500 09/04/19 0500  Weight: 73 kg 73 kg    Examination: General this is a 69 year old female. Resting in bed. No distress.  HENT NCAT no JVD MMM PERRL pulm scattered rhonchi. No accessory use currently on 4-5 liters Card RRR no MRG abd not tender + bowel sounds Ext brisk CR trace LE edema strong pulses Neuro awake oriented. Trace right drift  GU clear yellow   Resolved Hospital Problem list    Assessment & Plan:   Critically ill due to cerebral vasospasm from aneurysmal SAH requiring initiation of vasopressors and fluid resuscitation  Plan Target systolic blood pressure [id number]   Swallow eval today  Cont nimodipine Serial TCDs Serial neuro checks  Keep euvolemic    RUL PE due to immobility following SAH. No clearly associated pneumonia.  - very sedentary at baseline, hx of tobacco abuse, and obesity are risk factors for provoked  PE   Plan Supplemental oxygen to keeps sats > 92% Pulse ox Mobilization  IS Has IVC filter. 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 - Continue DAPT for stent Plan Asa and plavix for stent  HTN  Plan Holding antihypertensives for [place] therapy   Leukocytosis  ? reactive no fever spikes; recent completion of CAP   Plan Trend cbc  Incidental right thyroid nodule  Plan Out pt [place] +/- FNA  Fluid and electrolyte imbalance: Hypokalemia Plan Cont to replace and recheck as indicated .  Hyperglycemia Plan ssi   Daily Goals Checklist  Pain/Anxiety/Delirium protocol (if indicated): prn only Neuro vitals: every 1 hours AED's: none VAP protocol (if indicated): not intubated Respiratory support goals: HOB 45  Blood pressure target: MAP 110-115 as significant whip to arterial line makes Systolic less reliable.  DVT prophylaxis: heparin tid Nutrition Status: initiated tube feeds GI prophylaxis: protonix Fluid status goals: CVP monitoring, keep euvolemic Urinary catheter: Guide hemodynamic management Central lines: left [place] triple lumen Glucose control: SSI insulin.  Mobility/therapy needs: bedrest  Antibiotic de-escalation: complete 7 days for CAP vs Aspiration  Home medication reconciliation: on hold Daily labs: BMET bid, CBC daily Code Status: full Family Communication:  Updated son 5/19 Disposition: ICU   Labs   CBC: Recent Labs  Lab 08/30/19 0702 08/31/19 0321 09/02/19 0946 09/04/19 0710 09/05/19 0613  WBC 20.4* 21.5* 18.5* 23.7* 26.7*  HGB 13.9 13.7 10.9* 12.1 12.4  HCT 41.5 41.8 33.3* 36.2 37.7  MCV 81.4 83.6 84.1 83.2 84.3  PLT 266 283 310 369 [id number]    Basic Metabolic Panel: Recent Labs  Lab 08/31/19 0321 09/02/19 0946 09/02/19 1730 09/03/19 0650 09/03/19 1545 09/04/19 0710 09/04/19 1755  NA   < >  --  140 142 140 139 136  K   < >  --  2.0* 2.2* 3.1* 2.7* 4.1  CL   < >  --  105 108 103 100 100  CO2   < >  --  24 25 25 29 28   GLUCOSE   < >  --  228* 193* 206* 173* 190*  BUN   < >  --  8 9 7* 7* 8  CREATININE   < > 0.62 0.55 0.46 0.40* 0.44 0.41*  CALCIUM   < >  --  7.7* 7.6* 7.7* 8.3* 8.6*  MG  --  1.5* 2.3 1.7 2.5*  --   --   PHOS  --  2.2* 1.8* 1.6* 3.1  --   --    < > = values in this interval not displayed.   GFR: Estimated Creatinine Clearance: 60.9 mL/min  (A) (by C-G formula based on SCr of 0.41 mg/dL (L)). Recent Labs  Lab 08/31/19 0321 09/02/19 0946 09/04/19 0710 09/05/19 0613  WBC 21.5* 18.5* 23.7* 26.7*    Liver Function Tests: Recent Labs  Lab 08/30/19 0702  AST 21  ALT 39  ALKPHOS 68  BILITOT 0.7  PROT 7.1  ALBUMIN 3.1*   No results for input(s): LIPASE, AMYLASE in the last 168 hours. No results for input(s): AMMONIA in the last 168 hours.  ABG No results found for: PHART, PCO2ART, PO2ART, HCO3, TCO2, ACIDBASEDEF, O2SAT   Coagulation Profile: Recent Labs  Lab 08/31/19 0321  INR 1.1    Cardiac Enzymes: No results for input(s): CKTOTAL, CKMB, CKMBINDEX, TROPONINI in the last 168 hours.  HbA1C:  Hgb A1c MFr Bld  Date/Time Value Ref Range Status  09/02/2019 09:46 AM 6.7 (H) 4.8 - 5.6 % Final    Comment:    (NOTE) Pre diabetes:          5.7%-6.4% Diabetes:              >6.4% Glycemic control for   <7.0% adults with diabetes   01/25/2016 10:20 AM 6.4 (H) 4.8 - 5.6 % Final    Comment:             Pre-diabetes: 5.7 - 6.4          Diabetes: >6.4          Glycemic control for adults with diabetes: <7.0     CBG: Recent Labs  Lab 09/04/19 1603 09/04/19 1922 09/04/19 2310 09/05/19 0329 09/05/19 0723  GLUCAP 137* 164* 180* 182* 163*   CRITICAL CARE Performed by: Kipp Brood   Total critical care time: 35 minutes  Critical care time was exclusive of separately billable procedures and treating other patients.  Critical care was necessary to treat or prevent imminent or life-threatening deterioration.  Critical care was time spent personally by me on the following activities: development of treatment plan with patient and/or surrogate as well as nursing, discussions with consultants, evaluation of patient's response to treatment, examination of patient, obtaining history from patient or surrogate, ordering and performing treatments and interventions, ordering and review of laboratory studies, ordering  and review of radiographic studies, pulse oximetry, re-evaluation of patient's condition and participation in multidisciplinary rounds.  Kipp Brood, MD [facility] ICU Physician [place]  Pager: [phone] Mobile: [phone] After hours: [phone].     09/05/2019, 11:52 AM

## 2019-09-05 NOTE — Progress Notes (Signed)
Patient ID: Haley Mayo, female   DOB: [birth year], 69 y.o.   MRN: [id number] BP (!) 152/73 (BP Location: Left Arm)   Pulse 81   Temp 99.6 F (37.6 C) (Oral)   Resp 19   Wt 73 kg   SpO2 97%   BMI 31.43 kg/m  Lethargic, following all commands Non fluent, whisper volume with voice Moving all extremities Perrl, full eom Stable exam. No new recommendations. Continuing to improve with time.

## 2019-09-06 LAB — CBC
HCT: 39 % (ref 36.0–46.0)
Hemoglobin: 12.8 g/dL (ref 12.0–15.0)
MCH: 28.1 pg (ref 26.0–34.0)
MCHC: 32.8 g/dL (ref 30.0–36.0)
MCV: 85.7 fL (ref 80.0–100.0)
Platelets: 468 10*3/uL — ABNORMAL HIGH (ref 150–400)
RBC: 4.55 MIL/uL (ref 3.87–5.11)
RDW: 15.1 % (ref 11.5–15.5)
WBC: 28.9 10*3/uL — ABNORMAL HIGH (ref 4.0–10.5)
nRBC: 0.1 % (ref 0.0–0.2)

## 2019-09-06 LAB — GLUCOSE, CAPILLARY
Glucose-Capillary: 125 mg/dL — ABNORMAL HIGH (ref 70–99)
Glucose-Capillary: 126 mg/dL — ABNORMAL HIGH (ref 70–99)
Glucose-Capillary: 134 mg/dL — ABNORMAL HIGH (ref 70–99)
Glucose-Capillary: 138 mg/dL — ABNORMAL HIGH (ref 70–99)
Glucose-Capillary: 143 mg/dL — ABNORMAL HIGH (ref 70–99)
Glucose-Capillary: 188 mg/dL — ABNORMAL HIGH (ref 70–99)

## 2019-09-06 LAB — BASIC METABOLIC PANEL
Anion gap: 8 (ref 5–15)
BUN: 14 mg/dL (ref 8–23)
CO2: 30 mmol/L (ref 22–32)
Calcium: 9.1 mg/dL (ref 8.9–10.3)
Chloride: 99 mmol/L (ref 98–111)
Creatinine, Ser: 0.4 mg/dL — ABNORMAL LOW (ref 0.44–1.00)
GFR calc Af Amer: >60 mL/min (ref >60)
GFR calc non Af Amer: >60 mL/min (ref >60)
Glucose, Bld: 155 mg/dL — ABNORMAL HIGH (ref 70–99)
Potassium: 3.5 mmol/L (ref 3.5–5.1)
Sodium: 137 mmol/L (ref 135–145)

## 2019-09-06 NOTE — Progress Notes (Signed)
Patient ID: Haley Mayo, female   DOB: 11-15-1950, 69 y.o.   MRN: [id number] Alert, following all commands Perrl, full eom No drift Moving all extremities improving

## 2019-09-06 NOTE — Progress Notes (Signed)
NAME:  Haley Mayo, MRN:  [id number], DOB:  June 11, 1950, LOS: 6 ADMISSION DATE:  08/31/2019, CONSULTATION DATE:  09/01/2019 REFERRING MD:  Dr. Kathyrn Sheriff, CHIEF COMPLAINT:  SAH/ PE  Brief History   69 year old female initially admitted at [facility] for CAP on 5/12 who developed headache and diplopia found to have a [place].  Transferred to [facility] for further neurosurgical care.  Incidently on CTA found to have PE.    Past Medical History  HTN. 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 5/19 - started hemodynamic 3 vessel vasospasm.  5/19 - IVC filter placement 5/21 awake, oriented. Moving all ext. Doing well. Perhaps small right drift Significant Diagnostic Tests:   5/16 [facility] >>  Aneurysmal pattern subarachnoid hemorrhage over both convexities and extending into the ventricles and basal cisterns. 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 CTA of the head recommended for better characterization.  5/16 CTA head/ neck >> 1. Unchanged distribution of subarachnoid hemorrhage. 2. 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 3. Right upper lobar pulmonary artery embolus, incompletely visualized. 4. Aortic Atherosclerosis  2.3 cm right thyroid nodule  5/16 CTA PE >> 1. Filling defect within the proximal right upper lobe apical and posterior segmental arteries, consistent with pulmonary embolus. 2. Enlarged main pulmonary artery, consistent with pulmonary hypertension. 3. Moderate cardiomegaly. 4. Aortic Atherosclerosis (ICD10-I70.0). 5. 2.3 cm right thyroid heterogeneous nodule.   5/17 TCDs 5/17 b/l LE doppler study >> limited study, no evidence of DVT   5/18 CTA: continued severe spasm of bilateral A1/A2, M1/M2, and basilar arteries. Stent construct appears patent with spasm involving PCA as well. No new hemorrhage or HCP  Micro Data:  5/12  SARS 2 >> neg 5/14 UC >> neg 5/14 BC [facility]) >>  5/16 BCx 2 >> ngtd  Antimicrobials:  5/12 azithro >> 5/16 5/12 ceftriaxone >> 5/15 5/16 cefepime >> 5/17 5/16 vanc  Interim history/subjective:  Increasingly brisk responses.  No voiced complaints.  Objective   Blood pressure 121/67, pulse 87, temperature 97.7 F (36.5 C), temperature source Axillary, resp. rate 18, weight 73 kg, SpO2 91 %.        Intake/Output Summary (Last 24 hours) at 09/06/2019 1820 Last data filed at 09/06/2019 1800 Gross per 24 hour  Intake 3181.79 ml  Output 2625 ml  Net 556.79 ml   Filed Weights   09/03/19 0500 09/04/19 0500  Weight: 73 kg 73 kg    Examination: General this is a 69 year old female. Resting in bed. No distress.  HENT NCAT no JVD MMM PERRL pulm chest clear.  I. No accessory muscle use Card RRR no MRG abd not tender + bowel sounds Ext brisk CR trace LE edema strong pulses Neuro awake, responds appropriate but disoriented.  Strength symmetric. GU clear yellow   Resolved Hospital Problem list    Assessment & Plan:   Critically ill due to cerebral vasospasm from aneurysmal SAH requiring initiation of vasopressors and fluid resuscitation.  Likely exiting vasospasm window. Target systolic blood pressure 0000000 Cont nimodipine Serial TCDs Serial neuro checks  Keep euvolemic   RUL PE due to immobility following SAH. No clearly associated pneumonia.  - very sedentary at baseline, hx of tobacco abuse, and obesity are risk factors for provoked PE   Supplemental oxygen to keeps sats > 92% Has IVC  filter. 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 - Continue DAPT for stent  HTN  Holding antihypertensives for HHH therapy   Leukocytosis  ? reactive  Plan Trend cbc  Incidental right thyroid nodule  Plan Out pt [place] +/- FNA  Fluid and electrolyte imbalance:  Hypokalemia Plan Cont to replace and recheck as indicated .  Hyperglycemia Plan ssi   Daily Goals Checklist  Pain/Anxiety/Delirium protocol (if indicated): prn only Neuro vitals: every 1 hours AED's: none VAP protocol (if indicated): not intubated Respiratory support goals: HOB 45  Blood pressure target: MAP 110-115 as significant whip to arterial line makes Systolic less reliable.  DVT prophylaxis: heparin tid Nutrition Status: Holding tube feeds to allow  patient's appetite to improve. GI prophylaxis: protonix Fluid status goals: CVP monitoring, keep euvolemic Urinary catheter: Guide hemodynamic management Central lines: left [place] triple lumen Glucose control: SSI insulin.  Mobility/therapy needs: bedrest  Antibiotic de-escalation: complete 7 days for CAP vs Aspiration  Home medication reconciliation: on hold Daily labs: BMET bid, CBC daily Code Status: full Family Communication:  Updated son 5/19 Disposition: ICU   Labs   CBC: Recent Labs  Lab 08/31/19 0321 09/02/19 0946 09/04/19 0710 09/05/19 0613 09/06/19 0618  WBC 21.5* 18.5* 23.7* 26.7* 28.9*  HGB 13.7 10.9* 12.1 12.4 12.8  HCT 41.8 33.3* 36.2 37.7 39.0  MCV 83.6 84.1 83.2 84.3 85.7  PLT 283 310 369 394 468*    Basic Metabolic Panel: Recent Labs  Lab 08/31/19 0321 09/02/19 0946 09/02/19 1730 09/02/19 1730 09/03/19 0650 09/03/19 1545 09/04/19 0710 09/04/19 1755 09/06/19 0618  NA   < >  --  140   < > 142 140 139 136 137  K   < >  --  2.0*   < > 2.2* 3.1* 2.7* 4.1 3.5  CL   < >  --  105   < > 108 103 100 100 99  CO2   < >  --  24   < > 25 25 29 28 30   GLUCOSE   < >  --  228*   < > 193* 206* 173* 190* 155*  BUN   < >  --  8   < > 9 7* 7* 8 14  CREATININE   < > 0.62 0.55   < > 0.46 0.40* 0.44 0.41* 0.40*  CALCIUM   < >  --  7.7*   < > 7.6* 7.7* 8.3* 8.6* 9.1  MG  --  1.5* 2.3  --  1.7 2.5*  --   --   --   PHOS  --  2.2* 1.8*  --  1.6* 3.1  --   --   --    < > = values in this interval not  displayed.   GFR: Estimated Creatinine Clearance: 60.9 mL/min (A) (by C-G formula based on SCr of 0.4 mg/dL (L)). Recent Labs  Lab 09/02/19 0946 09/04/19 0710 09/05/19 0613 09/06/19 0618  WBC 18.5* 23.7* 26.7* 28.9*    Liver Function Tests: No results for input(s): AST, ALT, ALKPHOS, BILITOT, PROT, ALBUMIN in the last 168 hours. No results for input(s): LIPASE, AMYLASE in the last 168 hours. No results for input(s): AMMONIA in the last 168 hours.  ABG No results found for: PHART, PCO2ART, PO2ART, HCO3, TCO2, ACIDBASEDEF, O2SAT   Coagulation Profile: Recent Labs  Lab 08/31/19 0321  INR 1.1  Cardiac Enzymes: No results for input(s): CKTOTAL, CKMB, CKMBINDEX, TROPONINI in the last 168 hours.  HbA1C: Hgb A1c MFr Bld  Date/Time Value Ref Range Status  09/02/2019 09:46 AM 6.7 (H) 4.8 - 5.6 % Final    Comment:    (NOTE) Pre diabetes:          5.7%-6.4% Diabetes:              >6.4% Glycemic control for   <7.0% adults with diabetes   01/25/2016 10:20 AM 6.4 (H) 4.8 - 5.6 % Final    Comment:             Pre-diabetes: 5.7 - 6.4          Diabetes: >6.4          Glycemic control for adults with diabetes: <7.0     CBG: Recent Labs  Lab 09/05/19 2310 09/06/19 0331 09/06/19 0713 09/06/19 1207 09/06/19 1557  GLUCAP 203* 188* 138* 125* 143*   CRITICAL CARE Performed by: Kipp Brood   Total critical care time: 35 minutes  Critical care time was exclusive of separately billable procedures and treating other patients.  Critical care was necessary to treat or prevent imminent or life-threatening deterioration.  Critical care was time spent personally by me on the following activities: development of treatment plan with patient and/or surrogate as well as nursing, discussions with consultants, evaluation of patient's response to treatment, examination of patient, obtaining history from patient or surrogate, ordering and performing treatments and interventions,  ordering and review of laboratory studies, ordering and review of radiographic studies, pulse oximetry, re-evaluation of patient's condition and participation in multidisciplinary rounds.  Kipp Brood, MD [facility] ICU Physician [place]  Pager: [phone] Mobile: [phone] After hours: [phone].     09/06/2019, 6:20 PM

## 2019-09-07 ENCOUNTER — Inpatient Hospital Stay (HOSPITAL_COMMUNITY): Payer: Medicare Other

## 2019-09-07 DIAGNOSIS — I609 Nontraumatic subarachnoid hemorrhage, unspecified: Secondary | ICD-10-CM

## 2019-09-07 LAB — CBC
HCT: 37 % (ref 36.0–46.0)
Hemoglobin: 12 g/dL (ref 12.0–15.0)
MCH: 27.8 pg (ref 26.0–34.0)
MCHC: 32.4 g/dL (ref 30.0–36.0)
MCV: 85.6 fL (ref 80.0–100.0)
Platelets: 444 10*3/uL — ABNORMAL HIGH (ref 150–400)
RBC: 4.32 MIL/uL (ref 3.87–5.11)
RDW: 15.5 % (ref 11.5–15.5)
WBC: 25.5 10*3/uL — ABNORMAL HIGH (ref 4.0–10.5)
nRBC: 0 % (ref 0.0–0.2)

## 2019-09-07 LAB — PHOSPHORUS: Phosphorus: 4.2 mg/dL (ref 2.5–4.6)

## 2019-09-07 LAB — GLUCOSE, CAPILLARY
Glucose-Capillary: 112 mg/dL — ABNORMAL HIGH (ref 70–99)
Glucose-Capillary: 118 mg/dL — ABNORMAL HIGH (ref 70–99)
Glucose-Capillary: 134 mg/dL — ABNORMAL HIGH (ref 70–99)
Glucose-Capillary: 146 mg/dL — ABNORMAL HIGH (ref 70–99)
Glucose-Capillary: 166 mg/dL — ABNORMAL HIGH (ref 70–99)
Glucose-Capillary: 212 mg/dL — ABNORMAL HIGH (ref 70–99)

## 2019-09-07 LAB — BASIC METABOLIC PANEL
Anion gap: 10 (ref 5–15)
BUN: 14 mg/dL (ref 8–23)
CO2: 27 mmol/L (ref 22–32)
Calcium: 9 mg/dL (ref 8.9–10.3)
Chloride: 101 mmol/L (ref 98–111)
Creatinine, Ser: 0.43 mg/dL — ABNORMAL LOW (ref 0.44–1.00)
GFR calc Af Amer: >60 mL/min (ref >60)
GFR calc non Af Amer: >60 mL/min (ref >60)
Glucose, Bld: 137 mg/dL — ABNORMAL HIGH (ref 70–99)
Potassium: 3.9 mmol/L (ref 3.5–5.1)
Sodium: 138 mmol/L (ref 135–145)

## 2019-09-07 LAB — MAGNESIUM: Magnesium: 1.8 mg/dL (ref 1.7–2.4)

## 2019-09-07 MED ORDER — NIMODIPINE 30 MG PO CAPS
60.0000 mg | ORAL_CAPSULE | ORAL | Status: DC
  Administered 2019-09-07 – 2019-09-10 (×18): 60 mg via ORAL
  Filled 2019-09-07 (×15): qty 2

## 2019-09-07 MED ORDER — CLOPIDOGREL BISULFATE 75 MG PO TABS
75.0000 mg | ORAL_TABLET | Freq: Every day | ORAL | Status: DC
  Administered 2019-09-08 – 2019-09-10 (×3): 75 mg via ORAL
  Filled 2019-09-07 (×3): qty 1

## 2019-09-07 MED ORDER — ENSURE ENLIVE PO LIQD
237.0000 mL | Freq: Three times a day (TID) | ORAL | Status: DC
  Administered 2019-09-07 – 2019-09-10 (×4): 237 mL via ORAL

## 2019-09-07 MED ORDER — OXYCODONE-ACETAMINOPHEN 5-325 MG PO TABS: 1.0000 | ORAL_TABLET | ORAL | Status: DC | PRN

## 2019-09-07 MED ORDER — ACETAMINOPHEN 650 MG RE SUPP: 650.0000 mg | Freq: Four times a day (QID) | RECTAL | Status: DC | PRN

## 2019-09-07 MED ORDER — ASPIRIN 325 MG PO TABS
325.0000 mg | ORAL_TABLET | Freq: Every day | ORAL | Status: DC
  Administered 2019-09-08 – 2019-09-10 (×3): 325 mg via ORAL
  Filled 2019-09-07 (×3): qty 1

## 2019-09-07 MED ORDER — NIMODIPINE 6 MG/ML PO SOLN
60.0000 mg | ORAL | Status: DC
  Filled 2019-09-07 (×3): qty 10

## 2019-09-07 MED ORDER — ACETAMINOPHEN 325 MG PO TABS
650.0000 mg | ORAL_TABLET | Freq: Four times a day (QID) | ORAL | Status: DC | PRN
  Administered 2019-09-07 – 2019-09-08 (×2): 650 mg via ORAL
  Filled 2019-09-07: qty 2

## 2019-09-07 MED ORDER — POLYETHYLENE GLYCOL 3350 17 G PO PACK
17.0000 g | PACK | Freq: Every day | ORAL | Status: DC
  Administered 2019-09-08: 17 g via ORAL
  Filled 2019-09-07: qty 1

## 2019-09-07 MED ORDER — LOPERAMIDE HCL 1 MG/7.5ML PO SUSP
2.0000 mg | ORAL | Status: DC | PRN
  Filled 2019-09-07: qty 15

## 2019-09-07 NOTE — Anesthesia Postprocedure Evaluation (Signed)
Anesthesia Post Note  Patient: Maree Erie  Procedure(s) Performed: IR WITH ANESTHESIA (N/A )     Patient location during evaluation: PACU Anesthesia Type: General Level of consciousness: patient cooperative Pain management: pain level controlled Vital Signs Assessment: post-procedure vital signs reviewed and stable Respiratory status: spontaneous breathing, nonlabored ventilation, respiratory function stable and patient connected to nasal cannula oxygen Cardiovascular status: blood pressure returned to baseline and stable Postop Assessment: no apparent nausea or vomiting Anesthetic complications: no    Last Vitals:  Vitals:   09/07/19 1000 09/07/19 1100  BP: 110/61 132/69  Pulse: 94 80  Resp: 16 18  Temp:    SpO2: 94% 98%    Last Pain:  Vitals:   09/07/19 0800  TempSrc:   PainSc: 0-No pain                 Suezette Lafave

## 2019-09-07 NOTE — Progress Notes (Addendum)
NAME:  Haley Mayo, MRN:  [id number], DOB:  1950/05/25, LOS: 7 ADMISSION DATE:  08/31/2019, CONSULTATION DATE:  09/01/2019 REFERRING MD:  Dr. Kathyrn Sheriff, CHIEF COMPLAINT:  SAH, PE   Brief History   69 year old female with PMHx of HTN, hyperlipidemia, and smoking history originally admitted at [facility] for CAP on 5/12 and developed headache and diplopia, found to have SAH.Transferred to [facility] for further neurosurgical care. Incidentally found to have PE on CTA.   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 5/19 - started hemodynamic 3 vessel vasospasm.  5/19 - IVC filter placement 5/21 awake, oriented. Moving all ext. Doing well. Perhaps small right drift 5/24 - awake, verbal. Nl neuro exam, strength symmetric, full extremity movement. Eating. On repeat AM exam, less alert and c/o HA.    Significant Diagnostic Tests:  5/16 [facility] >>  Aneurysmal pattern subarachnoid hemorrhage over both convexities and extending into the ventricles and basal cisterns. 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 CTA of the head recommended for better characterization.  5/16 CTA head/ neck >> 1. Unchanged distribution of subarachnoid hemorrhage. 2. 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 3. Right upper lobar pulmonary artery embolus, incompletely visualized. 4. Aortic Atherosclerosis  2.3 cm right thyroid nodule  5/16 CTA PE >> 1. Filling defect within the proximal right upper lobe apical and posterior segmental arteries, consistent with pulmonary embolus. 2. Enlarged main pulmonary artery, consistent with pulmonary hypertension. 3. Moderate cardiomegaly. 4. Aortic Atherosclerosis (ICD10-I70.0). 5. 2.3 cm right thyroid heterogeneous nodule.   5/17 TCDs 5/17 b/l LE doppler study >> limited study, no evidence of  DVT   5/18 CTA: continued severe spasm of bilateral A1/A2, M1/M2, and basilar arteries. Stent construct appears patent with spasm involving PCA as well. No new hemorrhage or HCP  5/24 CT H: pending  Micro Data:  5/12 SARS 2 >> neg 5/14 UC >> neg 5/14 BC [facility]) >>  5/16 BCx 2 >> ngtd  Antimicrobials:  5/12 azithro >> 5/16 5/12 ceftriaxone >> 5/15 5/16 cefepime >> 5/17 5/16 vanc  Interim history/subjective:  Pt reports that she is feeling well today. Denies HA, vision changes/blurry vision, CP, leg pain, dyspnea. Talking fluently and voices no concerns.   11:35AM: Patient reports that she is having a HA. Less alert and talkative compared to prior exam. She states that she feels "miserable" due to soiling herself.  Objective   Blood pressure (!) 151/95, pulse 78, temperature 97.9 F (36.6 C), temperature source Oral, resp. rate 19, weight 73 kg, SpO2 97 %.        Intake/Output Summary (Last 24 hours) at 09/07/2019 0931 Last data filed at 09/07/2019 0600 Gross per 24 hour  Intake 1833.07 ml  Output 3075 ml  Net -1241.93 ml   Filed Weights   09/03/19 0500 09/04/19 0500  Weight: 73 kg 73 kg    Examination: General: resting comfortably, eating small bites, no distress, talkative  HEENT:  Lungs: Clear lungs, normal respiratory effort, no accessory muscle use Cardiovascular: RRR, no m/r/g Abdomen: Non-tender, active bowel sounds, no rebound/guarding Extremities: Full ROM of upper and lower extremities, 2+ pulses, 2+ and symmetric strength of UE and LE  Neuro: Alert and oriented. Normal CN I-XII, no pronator drift GU: Clear yellow urine  Resolved Hospital  Problem list    Assessment & Plan:  Critically ill due to cerebral vasospasm from aneurysmal SAH requiring initiation of vasopressors and fluid resuscitation.  Patient appears to be clinically improving but with waxing and waning mentation and headache. Ddx includes hydrocephalus, encephalopathy, delirium, and  re-bleed/continued vasospasm though less likely given time course and pattern of sx. Likely exiting vasospasm window. Plan - Non-con CT head given new headache - Target systolic blood pressure 0000000 - Serial TCDs - Serial neuro checks. Will reduce to q4h if CT head negative and pending TCDs - Cont nimodipine - Discontinue Dilaudid; switch to Percocet PRN - Continue phenylephrine and downtitrate - Will gradually wean off of O2 - Neurosurg following, appreciate recs - PT/OT/speech consult  RUL PE due to immobility following SAH. No clearly associated pneumonia. Pt is sedentary at baseline with hx of tobacco abuse, and obesity as risk factors for provoked PE. Currently s/p IVC filter. Plan - Supplemental O2 by NS to keeps sats > 92%. 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 Plan Continue DAPT for stent  HTN  Plan Holding antihypertensives for [place] therapy   Leukocytosis ? Reactive: WBC downtrending at 25.5 from 28.9. Plan Continue to trend CBC  Incidental right thyroid nodule  Plan Outpt [place] +/- FNA  Fluid and electrolyte imbalance: Nl volume status on exam, electrolytes wnl. Plan - Continue to replace and recheck as indicated. - Remove NG tube  Hyperglycemia Plan SSI  Best practice:  Pain/Anxiety/Delirium protocol (if indicated): prn only Neuro vitals: every 1 hours AED's: none VAP protocol (if indicated): not intubated Respiratory support goals: HOB 45  Blood pressure target: MAP 110-115 as significant whip to arterial line makes Systolic less reliable.  DVT prophylaxis: heparin tid Nutrition Status: Discontinue tube feeds and encourage PO intake GI prophylaxis: protonix Fluid status goals: CVP monitoring, keep euvolemic Urinary catheter: Guide hemodynamic management Central lines: left [place] triple lumen Glucose control: SSI insulin.    Mobility/therapy needs: bedrest  Antibiotic de-escalation: complete 7 days for CAP vs Aspiration  Home medication reconciliation: on hold Daily labs: BMET bid, CBC daily Code Status: full Family Communication:  Updated son 5/24 Disposition: ICU  Labs   CBC: Recent Labs  Lab 09/02/19 0946 09/04/19 0710 09/05/19 0613 09/06/19 0618 09/07/19 0442  WBC 18.5* 23.7* 26.7* 28.9* 25.5*  HGB 10.9* 12.1 12.4 12.8 12.0  HCT 33.3* 36.2 37.7 39.0 37.0  MCV 84.1 83.2 84.3 85.7 85.6  PLT 310 369 394 468* 444*    Basic Metabolic Panel: Recent Labs  Lab 09/02/19 0946 09/02/19 0946 09/02/19 1730 09/02/19 1730 09/03/19 0650 09/03/19 0650 09/03/19 1545 09/04/19 0710 09/04/19 1755 09/06/19 0618 09/07/19 0442 09/07/19 0728  NA  --   --  140   < > 142   < > 140 139 136 137 138  --   K  --   --  2.0*   < > 2.2*   < > 3.1* 2.7* 4.1 3.5 3.9  --   CL  --   --  105   < > 108   < > 103 100 100 99 101  --   CO2  --   --  24   < > 25   < > 25 29 28 30 27   --   GLUCOSE  --   --  228*   < > 193*   < >  206* 173* 190* 155* 137*  --   BUN  --   --  8   < > 9   < > 7* 7* 8 14 14   --   CREATININE 0.62   < > 0.55   < > 0.46   < > 0.40* 0.44 0.41* 0.40* 0.43*  --   CALCIUM  --   --  7.7*   < > 7.6*   < > 7.7* 8.3* 8.6* 9.1 9.0  --   MG 1.5*  --  2.3  --  1.7  --  2.5*  --   --   --   --  1.8  PHOS 2.2*  --  1.8*  --  1.6*  --  3.1  --   --   --   --  4.2   < > = values in this interval not displayed.   GFR: Estimated Creatinine Clearance: 60.9 mL/min (A) (by C-G formula based on SCr of 0.43 mg/dL (L)). Recent Labs  Lab 09/04/19 0710 09/05/19 0613 09/06/19 0618 09/07/19 0442  WBC 23.7* 26.7* 28.9* 25.5*    Liver Function Tests: No results for input(s): AST, ALT, ALKPHOS, BILITOT, PROT, ALBUMIN in the last 168 hours. No results for input(s): LIPASE, AMYLASE in the last 168 hours. No results for input(s): AMMONIA in the last 168 hours.  ABG No results found for: PHART, PCO2ART, PO2ART,  HCO3, TCO2, ACIDBASEDEF, O2SAT   Coagulation Profile: No results for input(s): INR, PROTIME in the last 168 hours.  Cardiac Enzymes: No results for input(s): CKTOTAL, CKMB, CKMBINDEX, TROPONINI in the last 168 hours.  HbA1C: Hgb A1c MFr Bld  Date/Time Value Ref Range Status  09/02/2019 09:46 AM 6.7 (H) 4.8 - 5.6 % Final    Comment:    (NOTE) Pre diabetes:          5.7%-6.4% Diabetes:              >6.4% Glycemic control for   <7.0% adults with diabetes   01/25/2016 10:20 AM 6.4 (H) 4.8 - 5.6 % Final    Comment:             Pre-diabetes: 5.7 - 6.4          Diabetes: >6.4          Glycemic control for adults with diabetes: <7.0     CBG: Recent Labs  Lab 09/06/19 1557 09/06/19 2008 09/06/19 2346 09/07/19 0356 09/07/19 0756  GLUCAP 143* 126* 134* 134* 112*     Past Medical History  She,  has a past medical history of HLD (hyperlipidemia), HTN (hypertension), Leukocytosis, Neoplasm of ear, Obesity, and Wears dentures.   Surgical History    Past Surgical History:  Procedure Laterality Date  . ABDOMINAL HYSTERECTOMY  Age 67  . COLONOSCOPY  09/2004   normal, rpt 10 yrs  . EAR CYST EXCISION Left 09/27/2017   Procedure: EXCISION EAR LOBE CYST;  Surgeon: Beverly Gust, MD;  Location: [place];  Service: ENT;  Laterality: Left;  LOCAL ONLY  . IR IVC FILTER PLMT / S&I /IMG GUID/MOD SED  09/02/2019  . OOPHORECTOMY    . RADIOLOGY WITH ANESTHESIA N/A 09/01/2019   Procedure: IR WITH ANESTHESIA;  Surgeon: Consuella Lose, MD;  Location: [place];  Service: Radiology;  Laterality: N/A;  . TOTAL VAGINAL HYSTERECTOMY  05/2001   heavy bleeding, fibroids     Social History   reports that she has been smoking cigarettes. She has a 40.00 pack-year smoking  history. She uses smokeless tobacco. She reports that she does not drink alcohol or use drugs.   Family History   Her family history includes Diabetes in her sister and sister; Lupus in her sister and sister; Other in  her sister; Pneumonia in her mother; Rheum arthritis in her sister; Stroke in her brother and sister; Ulcers in her father.   Allergies No Known Allergies   Home Medications  Prior to Admission medications   Medication Sig Start Date End Date Taking? Authorizing Provider  acetaminophen (TYLENOL) 325 MG tablet Take 2 tablets (650 mg total) by mouth every 6 (six) hours as needed for mild pain, fever or headache. 08/30/19  Yes Athena Masse, MD  amLODipine-benazepril (LOTREL) 10-20 MG capsule Take 1 capsule by mouth every evening.    Yes [provider]  Coenzyme Q10-Omega 3 Fatty Acd (HEALTHY HEART) EMUL Take 1 capsule by mouth daily.     Yes [provider]  Multiple Vitamins-Minerals (EMERGEN-C VITAMIN C PO) Take 1 tablet by mouth daily.    Yes [provider]  amLODipine (NORVASC) 10 MG tablet Take 1 tablet (10 mg total) by mouth daily. Patient not taking: Reported on 08/31/2019 08/31/19   Athena Masse, MD  benazepril (LOTENSIN) 20 MG tablet Take 1 tablet (20 mg total) by mouth daily. Patient not taking: Reported on 08/31/2019 08/31/19   Athena Masse, MD  ceFEPIme 2 g in sodium chloride 0.9 % 100 mL Inject 2 g into the vein every 8 (eight) hours. Patient not taking: Reported on 08/31/2019 08/30/19   Athena Masse, MD  enoxaparin (LOVENOX) 40 MG/0.4ML injection Inject 0.4 mLs (40 mg total) into the skin daily. Patient not taking: Reported on 08/31/2019 08/31/19   Athena Masse, MD  niMODipine (NIMOTOP) 30 MG capsule Take 2 capsules (60 mg total) by mouth every 4 (four) hours. Patient not taking: Reported on 08/31/2019 08/30/19   Athena Masse, MD  polyethylene glycol (MIRALAX / GLYCOLAX) 17 g packet Take 17 g by mouth daily. Patient not taking: Reported on 08/31/2019 08/31/19   Athena Masse, MD  vancomycin (VANCOREADY) 1250 MG/250ML SOLN Inject 250 mLs (1,250 mg total) into the vein daily. Patient not taking: Reported on 08/31/2019 08/30/19   Athena Masse, MD      Critical care time:

## 2019-09-07 NOTE — Progress Notes (Signed)
  NEUROSURGERY PROGRESS NOTE   No issues overnight.  Much more awake and alert today  EXAM:  BP 137/85   Pulse 67   Temp 97.9 F (36.6 C) (Oral)   Resp 13   Wt 73 kg   SpO2 98%   BMI 31.43 kg/m   Awake, alert, oriented to self, location, year Speech slow, Somewhat confused at times  CN grossly intact  MAEW, seemingly nonfocal exam Mild right drift  IMPRESSION/PLAN 69 y.o. female SAH d#12 s/p stent-coil fusiform basilar aneurysm, severe symptomatic spasm is responding well clinically to hyperdynamic treatment, BP goals lowered and patient remains neurologically stable. Small PE, asymptomatic. S/P IVC filter placement - continue ASA/Plavix for stent - TCDs today - continue supportive care, nimotop - PT/OT/SLP

## 2019-09-07 NOTE — Progress Notes (Signed)
  Speech Language Pathology Treatment: Dysphagia  Patient Details Name: Haley Mayo MRN: [id number] DOB: 08-21-1950 Today's Date: 09/07/2019 Time: [id number] SLP Time Calculation (min) (ACUTE ONLY): 13 min  Assessment / Plan / Recommendation Clinical Impression  Pt is more alert but still confused, thinking she is in [place] and needing Min-Mod cues to recall information within tx session. Min cues were provided for sustained attention to self-feeding. Pt gravitated toward mostly the pureed foods on her tray and could not label the foods on her breakfast tray outside of her ice cream. She tried a single bite of chopped sausage that elicited a cough, as pt was eating during mastication. No further signs of aspiration were noted with other consistencies. Recommend continuing Dys 2 diet and thin liquids for now in light of dentition and altered mentation but with good prognosis for advancement.    HPI HPI: Pt is 69 yo female that presented to ED for generalized weakness, pt stated she has been down on the floor at home since 5/7. Reported recent [place] vaccination (5/3). PMH of HLD, HTN. On 5/16 pt with c/o diplopia. CT of head showed SAH 108mm basilar tip aneurysm.  Pt transferred to [facility] for neurosurgery consult. Pt underwent diagnostic cerebral angiogram with stent-supported coil embolization of basila aneurysm on 5/18. Pt underwent IVC filter placement on 5/19.      SLP Plan  Continue with current plan of care       Recommendations  Diet recommendations: Dysphagia 2 (fine chop);Thin liquid Liquids provided via: Cup;Straw Medication Administration: Whole meds with puree Supervision: Patient able to self feed;Intermittent supervision to cue for compensatory strategies Compensations: Slow rate;Small sips/bites Postural Changes and/or Swallow Maneuvers: Seated upright 90 degrees                Oral Care Recommendations: Oral care BID Follow up Recommendations: Inpatient  Rehab SLP Visit Diagnosis: Dysphagia, unspecified (R13.10) Plan: Continue with current plan of care       GO                 Haley Mayo., M.A. [place] [facility] Pager [phone] Office [phone]  09/07/2019, 8:58 AM

## 2019-09-07 NOTE — Progress Notes (Signed)
Nutrition Follow-up  DOCUMENTATION CODES:   Not applicable  INTERVENTION:   Ensure Enlive po BID, each supplement provides 350 kcal and 20 grams of protein  Magic cup TID with meals, each supplement provides 290 kcal and 9 grams of protein  Encourage PO intake  If PO intake remains </= 50% of her meals recommend continuing cortrak and initiating nocturnal feedings.    NUTRITION DIAGNOSIS:   Inadequate oral intake related to decreased appetite, dysphagia as evidenced by meal completion < 50%. Ongoing.   GOAL:   Patient will meet greater than or equal to 90% of their needs Progressing.   MONITOR:   PO intake, Supplement acceptance  REASON FOR ASSESSMENT:   Consult Enteral/tube feeding initiation and management  ASSESSMENT:   Pt with PMH of HTN and tobacco abuse who was admitted to [facility] 5/12 with CAP after feeling bad at home since 5/7. Pt lives alone and does not go out much.    5/16 tx to [facility] for [place]; RUL PE also found 5/18 s/p stent and coiling  5/19 cortrak placement  5/24 TF d/c'ed, cortrak remains; adding supplements  Pt discussed during ICU rounds and with RN. Per RN pt not eating much. MD d/c'ed TF in hopes of increasing PO intake.      Medications reviewed and include: SSI, nimotop, miralax (held) Neosynephrine @ 40 mcg  Labs reviewed: PO4: 2.2 (L), Magnesium: 1.5 (L), K: 3.4 (L) CBG's: [id number]    Diet Order:   Diet Order            DIET DYS 2 Room service appropriate? Yes with Assist; Fluid consistency: Thin  Diet effective now              EDUCATION NEEDS:   No education needs have been identified at this time  Skin:  Skin Assessment: Skin Integrity Issues: Skin Integrity Issues:: Stage II Stage II: coccyx  Last BM:  200 via rectal tube  Height:   Ht Readings from Last 1 Encounters:  08/30/19 5' (1.524 m)    Weight:   Wt Readings from Last 1 Encounters:  09/04/19 73 kg    Ideal Body Weight:  45.4 kg  BMI:  Body mass  index is 31.43 kg/m.  Estimated Nutritional Needs:   Kcal:  1700-1900  Protein:  100-120 grams  Fluid:  > 1.7 L/day  Lockie Pares., RD, LDN, CNSC See AMiON for contact information

## 2019-09-07 NOTE — Progress Notes (Signed)
Physical Therapy Treatment Patient Details Name: Haley Mayo MRN: [id number] DOB: May 03, 1950 Today's Date: 09/07/2019    History of Present Illness Pt is 69 yo female that presented to ED for generalized weakness, pt stated she has been down on the floor at home since 5/7. Reported recent [place] vaccination (5/3). PMH of HLD, HTN. On 5/16 pt with c/o diplopia.CT of head showed SAH 8mm basilar tip aneurysm.  Pt transferred to [facility] for neurosurgery consult. Pt underwent diagnostic cerebral angiogram with stent-supported coil embolization of basila aneurysm on 5/18. +PE. Pt underwent IVC filter placement on 5/19.    PT Comments    Worked with OT today and focused on EOB balance and posture and sequencing stepping for std pvt transfers. Pt remains to required max directional verbal cues and maximal physical assist to complete OOB mobility. Pt remains to have impaired sequencing, processing, memory, and co-ordination. Cont to recommend CIR Upon d/c for maximal functional recovery.    Follow Up Recommendations  CIR;Supervision/Assistance - 24 hour     Equipment Recommendations  Wheelchair (measurements PT);Wheelchair cushion (measurements PT);Hospital bed;Other (comment)    Recommendations for Other Services       Precautions / Restrictions Precautions Precautions: Fall Precaution Comments: R visual field diplopia on eval - pt reports no diplopia today and without taped glasses  Restrictions Weight Bearing Restrictions: No    Mobility  Bed Mobility Overal bed mobility: Needs Assistance Bed Mobility: Supine to Sit Rolling: Mod assist(MOD A L+R rolling - holds position c MIN A )   Supine to sit: Max assist;+2 for safety/equipment     General bed mobility comments: max multimodal cues for pt to self-initiate. pt able to bring LEs towards EOB but requiring assist to maintain position, heavy assist for trunk elevation with use of bedrail. Supine>sit from flat bed    Transfers Overall transfer level: Needs assistance Equipment used: Rolling walker (2 wheeled) Transfers: Sit to/from [facility] Sit to Stand: Mod assist;+2 physical assistance Stand pivot transfers: Max assist;+2 physical assistance;+2 safety/equipment       General transfer comment: pt requiring max directional and verbal cues to complete std pvt transfer, maxA posterior hip tactile cues/support required to maintain upright posture, pt with minimal foot clearance, dependent on RW and physical assist  Ambulation/Gait                 Stairs             Wheelchair Mobility    Modified Rankin (Stroke Patients Only) Modified Rankin (Stroke Patients Only) Pre-Morbid Rankin Score: No significant disability(unsure of baseline) Modified Rankin: Moderately severe disability     Balance Overall balance assessment: Needs assistance Sitting-balance support: Bilateral upper extremity supported;Feet unsupported Sitting balance-Leahy Scale: Poor Sitting balance - Comments: pt initially retropulsive however progess to sitting EOB with minimal UE support for short periods of time. Postural control: Posterior lean;Right lateral lean Standing balance support: Bilateral upper extremity supported Standing balance-Leahy Scale: Poor Standing balance comment: initially bilat knees blocked in RW, max verbal and tactile cues to straighten knees and contract gluts, dependent on RW                            Cognition Arousal/Alertness: Awake/alert(intermittent lethargy) Behavior During Therapy: Flat affect Overall Cognitive Status: Impaired/Different from baseline Area of Impairment: Orientation;Attention;Memory;Following commands;Safety/judgement;Awareness;Problem solving                 Orientation Level: Disoriented to;Situation;Place Current  Attention Level: Sustained Memory: Decreased recall of precautions;Decreased short-term  memory Following Commands: Follows one step commands with increased time Safety/Judgement: Decreased awareness of safety;Decreased awareness of deficits Awareness: Emergent Problem Solving: Slow processing;Requires verbal cues;Requires tactile cues General Comments: pt didn't believe it was morning, pt initially not oriented to hospital, end of session pt able to recall, max directional verbal and tactile cues required to complete task asked      Exercises      General Comments General comments (skin integrity, edema, etc.): VSS throughout on 3L, denies dizzines, BP 110/61      Pertinent Vitals/Pain Pain Assessment: Faces Faces Pain Scale: Hurts even more Pain Location: R flank and bilat underarms with touch Pain Descriptors / Indicators: Grimacing;Discomfort;Sore;Sharp Pain Intervention(s): Limited activity within patient's tolerance    Home Living                      Prior Function            PT Goals (current goals can now be found in the care plan section) Acute Rehab PT Goals Patient Stated Goal: to improve mobility Progress towards PT goals: Progressing toward goals    Frequency    Min 4X/week      PT Plan Current plan remains appropriate    Co-evaluation PT/OT/SLP Co-Evaluation/Treatment: Yes Reason for Co-Treatment: Complexity of the patient's impairments (multi-system involvement) PT goals addressed during session: Mobility/safety with mobility OT goals addressed during session: ADL's and self-care      AM-PAC PT "6 Clicks" Mobility   Outcome Measure  Help needed turning from your back to your side while in a flat bed without using bedrails?: A Lot Help needed moving from lying on your back to sitting on the side of a flat bed without using bedrails?: A Lot Help needed moving to and from a bed to a chair (including a wheelchair)?: Total Help needed standing up from a chair using your arms (e.g., wheelchair or bedside chair)?: Total Help  needed to walk in hospital room?: Total Help needed climbing 3-5 steps with a railing? : Total 6 Click Score: 8    End of Session Equipment Utilized During Treatment: Oxygen Activity Tolerance: Patient tolerated treatment well Patient left: in chair;with call bell/phone within reach;with chair alarm set Nurse Communication: Mobility status PT Visit Diagnosis: Other abnormalities of gait and mobility (R26.89);Muscle weakness (generalized) (M62.81);Other symptoms and signs involving the nervous system (R29.898)     Time: [id number] PT Time Calculation (min) (ACUTE ONLY): 26 min  Charges:  $Neuromuscular Re-education: 8-22 mins                     [name], PT, DPT Acute Rehabilitation Services Pager #: [phone] Office #: [phone]    [name] 09/07/2019, 10:46 AM

## 2019-09-07 NOTE — Progress Notes (Signed)
Transcranial Doppler  Date POD PCO2 HCT BP  MCA ACA PCA OPHT SIPH VERT Basilar  5/17 GC     Right  Left   *  *   *  *   *  *   12  25   32  55   -43  -29   *      5/19,rs     Right  Left   *  *   *  *   *  *   65  43   88  103   -55  -31   -84      5/21 GC     Right  Left   *  *   *  *   *  *   13  22   23  24    *  *   *      5/24 GC      Right  Left   *  *   *  *   *  *   13  16   21  27    *  *   *            Right  Left                                            Right  Left                                            Right  Left                                        MCA = Middle Cerebral Artery      OPHT = Opthalmic Artery     BASILAR = Basilar Artery   ACA = Anterior Cerebral Artery     SIPH = Carotid Siphon PCA = Posterior Cerebral Artery   VERT = Verterbral Artery                   Normal MCA = 62+\-12 ACA = 50+\-12 PCA = 42+\-23   (*) no window  - not insonated.   09/07/19 12:00 PM Carlos Levering RVT

## 2019-09-07 NOTE — Progress Notes (Signed)
OT Treatment:  Pt making gradual progress towards OT goals. She continues to require two person assist for safe completion of mobility tasks, tolerating OOB to recliner using RW today. Pt overall requiring maxA for LB ADL and up to minA for UB ADL. Pt with flat affect and poor cognition at this time, requiring questioning cues for orientation questions and multimodal cues for command following throughout. Feel she remains appropriate for CIR level therapies at time of discharge. Will continue to follow while acutely admitted.     09/07/19 1008  OT Visit Information  Last OT Received On 09/07/19  Assistance Needed +2  PT/OT/SLP Co-Evaluation/Treatment Yes  Reason for Co-Treatment Complexity of the patient's impairments (multi-system involvement);Necessary to address cognition/behavior during functional activity;For patient/therapist safety;To address functional/ADL transfers  OT goals addressed during session ADL's and self-care  History of Present Illness Pt is 69 yo female that presented to ED for generalized weakness, pt stated she has been down on the floor at home since 5/7. Reported recent [place] vaccination (5/3). PMH of HLD, HTN. 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  Pt transferred to [facility] for neurosurgery consult. Pt underwent diagnostic cerebral angiogram with stent-supported coil embolization of basila aneurysm on 5/18. +PE. Pt underwent IVC filter placement on 5/19.  Precautions  Precautions Fall  Precaution Comments R visual field diplopia on eval - pt reports no diplopia today and without taped glasses   Pain Assessment  Pain Assessment Faces  Faces Pain Scale 6  Pain Location underarms with touch, LEs intermittently with certain positions/movements, buttocks  Pain Descriptors / Indicators Grimacing;Discomfort;Sore;Sharp  Pain Intervention(s) Limited activity within patient's tolerance;Monitored during session;Repositioned  Cognition   Arousal/Alertness Awake/alert (intermittent lethargy)  Behavior During Therapy Flat affect  Overall Cognitive Status Impaired/Different from baseline  Area of Impairment Orientation;Attention;Memory;Following commands;Safety/judgement;Awareness;Problem solving  Orientation Level Disoriented to;Situation;Place  Current Attention Level Sustained  Memory Decreased recall of precautions;Decreased short-term memory  Following Commands Follows one step commands with increased time  Safety/Judgement Decreased awareness of safety;Decreased awareness of deficits  Awareness Emergent  Problem Solving Slow processing;Requires verbal cues;Requires tactile cues  General Comments pt didn't believe it was morning, pt initially not oriented to hospital, end of session pt able to recall, max directional verbal and tactile cues required to complete task asked  ADL  Overall ADL's  Needs assistance/impaired  Grooming Wash/dry face;Set up;Supervision/safety;Sitting  Grooming Details (indicate cue type and reason) performed after transfer to recliner   Lower Body Dressing Maximal assistance;+2 for physical assistance;Sit to/from stand;Sitting/lateral leans  Lower Body Dressing Details (indicate cue type and reason) attempted to have pt don her own socks but pt unable   Toileting - Clothing Manipulation Details (indicate cue type and reason) pt currently with flexi/foley  Functional mobility during ADLs Moderate assistance;Maximal assistance;+2 for physical assistance;Rolling walker (sit<>stand)  General ADL Comments pt continues to present with weakness, impaired cognition, poor sitting/standing balance and mobility status   Bed Mobility  Overal bed mobility Needs Assistance  Bed Mobility Supine to Sit  Supine to sit Max assist;+2 for safety/equipment  General bed mobility comments max multimodal cues for pt to self-initiate. pt able to bring LEs towards EOB but requiring assist to maintain position, heavy  assist for trunk elevation with use of bedrail. Supine>sit from flat bed   Balance  Overall balance assessment Needs assistance  Sitting-balance support Bilateral upper extremity supported;Feet unsupported  Sitting balance-Leahy Scale Poor  Sitting balance - Comments able to maintain static  balance for short periods with close minguard assist, overall requiring at least minA for balance  Postural control Posterior lean;Right lateral lean  Standing balance support Bilateral upper extremity supported  Standing balance-Leahy Scale Poor  Standing balance comment +2 assist and UE support for standing balance   Restrictions  Weight Bearing Restrictions No  Transfers  Overall transfer level Needs assistance  Equipment used Rolling walker (2 wheeled)  Transfers Sit to/from Stand;Stand Pivot Transfers  Sit to Stand Mod assist;+2 physical assistance  Stand pivot transfers Max assist;+2 physical assistance;+2 safety/equipment  General transfer comment pt requiring boosting and steadying assist to RW, VCs/assist for upright posture. Attempting steps in place with assist for knee block and wt shifting. completed x2 sit<>stand from EOB. pt with significant difficulty sequencing LE/RW movements with transition to recliner, requiring assist for advancing LEs/RW throughout  General Comments  General comments (skin integrity, edema, etc.) Pt on 3L throughout, denies dizzines, BP 110/61  OT - End of Session  Equipment Utilized During Treatment Gait belt;Rolling walker;Oxygen  Activity Tolerance Patient tolerated treatment well  Patient left in chair;with call bell/phone within reach;with chair alarm set  Nurse Communication Mobility status  OT Assessment/Plan  OT Plan Discharge plan remains appropriate  OT Visit Diagnosis Other abnormalities of gait and mobility (R26.89);Muscle weakness (generalized) (M62.81);Low vision, both eyes (H54.2);Other symptoms and signs involving cognitive function  Symptoms and  signs involving cognitive functions Nontraumatic SAH  OT Frequency (ACUTE ONLY) Min 2X/week  Follow Up Recommendations CIR;Supervision/Assistance - 24 hour  OT Equipment 3 in 1 bedside commode  AM-PAC OT "6 Clicks" Daily Activity Outcome Measure (Version 2)  Help from another person eating meals? 2  Help from another person taking care of personal grooming? 2  Help from another person toileting, which includes using toliet, bedpan, or urinal? 1  Help from another person bathing (including washing, rinsing, drying)? 2  Help from another person to put on and taking off regular upper body clothing? 2  Help from another person to put on and taking off regular lower body clothing? 1  6 Click Score 10  OT Goal Progression  Progress towards OT goals Progressing toward goals  Acute Rehab OT Goals  Patient Stated Goal to improve mobility  OT Goal Formulation With patient  Time For Goal Achievement 09/17/19  Potential to Achieve Goals Good  ADL Goals  Pt Will Perform Grooming with set-up;with supervision;sitting  Pt Will Perform Lower Body Dressing with adaptive equipment;sit to/from stand;with set-up;with supervision  Pt Will Transfer to Toilet with mod assist;bedside commode;stand pivot transfer  Pt Will Perform Toileting - Clothing Manipulation and hygiene with set-up;with supervision;sit to/from stand;with adaptive equipment  Pt Will Perform Upper Body Bathing with set-up;with supervision;sitting  Pt Will Perform Lower Body Bathing with min assist;sit to/from stand;sitting/lateral leans  Additional ADL Goal #1 Pt will demonstrate selective attention during ADL task in nondistracting environment with minimal redirectional cues  OT Time Calculation  OT Start Time (ACUTE ONLY) 0939  OT Stop Time (ACUTE ONLY) 1006  OT Time Calculation (min) 27 min  OT General Charges  $OT Visit 1 Visit  OT Treatments  $Self Care/Home Management  8-22 mins    Lou Cal, OT Acute Rehabilitation  Services Pager [phone] Office [phone]

## 2019-09-08 LAB — GLUCOSE, CAPILLARY
Glucose-Capillary: 120 mg/dL — ABNORMAL HIGH (ref 70–99)
Glucose-Capillary: 130 mg/dL — ABNORMAL HIGH (ref 70–99)
Glucose-Capillary: 144 mg/dL — ABNORMAL HIGH (ref 70–99)
Glucose-Capillary: 151 mg/dL — ABNORMAL HIGH (ref 70–99)
Glucose-Capillary: 96 mg/dL (ref 70–99)
Glucose-Capillary: 99 mg/dL (ref 70–99)

## 2019-09-08 LAB — BASIC METABOLIC PANEL
Anion gap: 8 (ref 5–15)
BUN: 13 mg/dL (ref 8–23)
CO2: 25 mmol/L (ref 22–32)
Calcium: 9.2 mg/dL (ref 8.9–10.3)
Chloride: 103 mmol/L (ref 98–111)
Creatinine, Ser: 0.41 mg/dL — ABNORMAL LOW (ref 0.44–1.00)
GFR calc Af Amer: >60 mL/min (ref >60)
GFR calc non Af Amer: >60 mL/min (ref >60)
Glucose, Bld: 126 mg/dL — ABNORMAL HIGH (ref 70–99)
Potassium: 4.2 mmol/L (ref 3.5–5.1)
Sodium: 136 mmol/L (ref 135–145)

## 2019-09-08 LAB — CBC
HCT: 33.9 % — ABNORMAL LOW (ref 36.0–46.0)
Hemoglobin: 10.9 g/dL — ABNORMAL LOW (ref 12.0–15.0)
MCH: 27.3 pg (ref 26.0–34.0)
MCHC: 32.2 g/dL (ref 30.0–36.0)
MCV: 85 fL (ref 80.0–100.0)
Platelets: 402 10*3/uL — ABNORMAL HIGH (ref 150–400)
RBC: 3.99 MIL/uL (ref 3.87–5.11)
RDW: 15.5 % (ref 11.5–15.5)
WBC: 18.7 10*3/uL — ABNORMAL HIGH (ref 4.0–10.5)
nRBC: 0 % (ref 0.0–0.2)

## 2019-09-08 LAB — PHOSPHORUS: Phosphorus: 3.7 mg/dL (ref 2.5–4.6)

## 2019-09-08 LAB — MAGNESIUM: Magnesium: 1.7 mg/dL (ref 1.7–2.4)

## 2019-09-08 NOTE — Progress Notes (Signed)
Inpatient Rehab Admissions Coordinator:   Spoke with pt's son over the phone.  He is planning to come to [facility] following his mother's rehabilitation to provide initial 24/7 support at discharge.  Will look to possibly admit pt in the next few days pending bed availability.   Shann Medal, PT, DPT Admissions Coordinator [phone] 09/08/19  3:15 PM

## 2019-09-08 NOTE — Progress Notes (Signed)
Nutrition Follow-up  DOCUMENTATION CODES:   Not applicable  INTERVENTION:   Ensure Enlive po BID, each supplement provides 350 kcal and 20 grams of protein  Magic cup TID with meals, each supplement provides 290 kcal and 9 grams of protein  Encourage PO intake   NUTRITION DIAGNOSIS:   Inadequate oral intake related to decreased appetite, dysphagia as evidenced by meal completion < 50%. Ongoing.   GOAL:   Patient will meet greater than or equal to 90% of their needs Progressing.   MONITOR:   PO intake, Supplement acceptance  REASON FOR ASSESSMENT:   Consult Enteral/tube feeding initiation and management  ASSESSMENT:   Pt with PMH of HTN and tobacco abuse who was admitted to [facility] 5/12 with CAP after feeling bad at home since 5/7. Pt lives alone and does not go out much.    5/16 tx to [facility] for [place]; RUL PE also found 5/18 s/p stent and coiling  5/19 cortrak placement  5/24 TF d/c'ed, cortrak remains; adding supplements  Pt discussed during ICU rounds and with RN. PO intake has improved today; meal completion at Breakfast 50%. Lunch just delivered. Pt states that she can feed herself and does like the magic cups.    Off pressors. Pt complains that her butt hurts.   Medications reviewed and include: SSI, nimotop, miralax   Labs reviewed: CBG's: [id number]    Diet Order:   Diet Order            DIET DYS 2 Room service appropriate? Yes with Assist; Fluid consistency: Thin  Diet effective now              EDUCATION NEEDS:   No education needs have been identified at this time  Skin:  Skin Assessment: Skin Integrity Issues: Skin Integrity Issues:: Stage II Stage II: coccyx  Last BM:  5/24  Height:   Ht Readings from Last 1 Encounters:  08/30/19 5' (1.524 m)    Weight:   Wt Readings from Last 1 Encounters:  09/08/19 73.1 kg    Ideal Body Weight:  45.4 kg  BMI:  Body mass index is 31.47 kg/m.  Estimated Nutritional Needs:   Kcal:   1700-1900  Protein:  100-120 grams  Fluid:  > 1.7 L/day  Lockie Pares., RD, LDN, CNSC See AMiON for contact information

## 2019-09-08 NOTE — Progress Notes (Signed)
Physical Therapy Treatment Patient Details Name: Haley Mayo MRN: [id number] DOB: 1951/02/06 Today's Date: 09/08/2019    History of Present Illness Pt is 69 yo female that presented to ED for generalized weakness, pt stated she has been down on the floor at home since 5/7. Reported recent [place] vaccination (5/3). PMH of HLD, HTN. 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  Pt transferred to [facility] for neurosurgery consult. Pt underwent diagnostic cerebral angiogram with stent-supported coil embolization of basila aneurysm on 5/18. +PE. Pt underwent IVC filter placement on 5/19.    PT Comments    Pt remains to have impaired processing, impaired sequencing, decreased insight to deficits and safety, confusion, and tangential speech. Pt was able to tolerate ambulation today x 15' with RW and maxAx2 plus a 3rd person for chair follow. Pt remains very deconditioned however is showing improvement. Acute PT to continue to follow.    Follow Up Recommendations  CIR;Supervision/Assistance - 24 hour     Equipment Recommendations  Wheelchair (measurements PT);Wheelchair cushion (measurements PT);Hospital bed;Other (comment)    Recommendations for Other Services OT consult     Precautions / Restrictions Precautions Precautions: Fall Precaution Comments: R visual field diplopia on eval - pt reports no diplopia today and without taped glasses  Restrictions Weight Bearing Restrictions: No    Mobility  Bed Mobility Overal bed mobility: Needs Assistance Bed Mobility: Rolling;Sidelying to Sit Rolling: Mod assist Sidelying to sit: Mod assist       General bed mobility comments: max directional verbal cues, modA for trunk elevation and to obstain balance at EOB  Transfers Overall transfer level: Needs assistance Equipment used: Rolling walker (2 wheeled) Transfers: Sit to/from Stand Sit to Stand: Mod assist;+2 physical assistance         General  transfer comment: pt requiring max directional and verbal cues to complete std pvt transfer, maxA posterior hip tactile cues/support required to maintain upright posture, pt with minimal foot clearance, dependent on RW and physical assist  Ambulation/Gait Ambulation/Gait assistance: Max assist;+2 physical assistance;+2 safety/equipment Gait Distance (Feet): 15 Feet Assistive device: Rolling walker (2 wheeled) Gait Pattern/deviations: Decreased stride length;Shuffle Gait velocity: slow Gait velocity interpretation: <1.8 ft/sec, indicate of risk for recurrent falls General Gait Details: pt requring maxA at pelvis and underarm to offweight body to allow for foot advancement, 2nd person for lines, and 3rd person for walker management, pt with minimal step height and max directional verbal cues to stay in walker, pt required continuous assist for forward movement and max verbal cues to not push walker to far in front   Stairs             Wheelchair Mobility    Modified Rankin (Stroke Patients Only) Modified Rankin (Stroke Patients Only) Pre-Morbid Rankin Score: No significant disability Modified Rankin: Moderately severe disability     Balance Overall balance assessment: Needs assistance Sitting-balance support: Bilateral upper extremity supported;Feet unsupported Sitting balance-Leahy Scale: Poor Sitting balance - Comments: pt with R lateral lean   Standing balance support: Bilateral upper extremity supported Standing balance-Leahy Scale: Poor Standing balance comment: dependent on physical assist and RW                            Cognition Arousal/Alertness: Awake/alert Behavior During Therapy: Flat affect Overall Cognitive Status: Impaired/Different from baseline Area of Impairment: Orientation;Attention;Memory;Following commands;Safety/judgement;Awareness;Problem solving  Orientation Level: Disoriented to(time of day and day of the  week) Current Attention Level: Sustained Memory: Decreased short-term memory Following Commands: Follows one step commands with increased time;Follows one step commands inconsistently Safety/Judgement: Decreased awareness of safety;Decreased awareness of deficits Awareness: Emergent Problem Solving: Slow processing;Requires verbal cues;Requires tactile cues General Comments: pt with periods of confusion and tangential speech or speech that is non-relative to the conversation or task asked. Pt continues to demo impaired processing and sequencing      Exercises General Exercises - Lower Extremity Ankle Circles/Pumps: AROM;Both;10 reps;Seated Quad Sets: AROM;Both;10 reps;Seated Long Arc Quad: AROM;Both;10 reps;Seated Hip Flexion/Marching: AROM;Both;10 reps;Seated    General Comments General comments (skin integrity, edema, etc.): vss, RN just finished In and Out cathing patient      Pertinent Vitals/Pain Pain Assessment: Faces Faces Pain Scale: Hurts even more Pain Location: "my ass hurts" Pain Descriptors / Indicators: Restless Pain Intervention(s): Monitored during session    Home Living                      Prior Function            PT Goals (current goals can now be found in the care plan section) Progress towards PT goals: Progressing toward goals    Frequency    Min 4X/week      PT Plan Current plan remains appropriate    Co-evaluation              AM-PAC PT "6 Clicks" Mobility   Outcome Measure  Help needed turning from your back to your side while in a flat bed without using bedrails?: A Lot Help needed moving from lying on your back to sitting on the side of a flat bed without using bedrails?: A Lot Help needed moving to and from a bed to a chair (including a wheelchair)?: Total Help needed standing up from a chair using your arms (e.g., wheelchair or bedside chair)?: Total Help needed to walk in hospital room?: Total Help needed climbing  3-5 steps with a railing? : Total 6 Click Score: 8    End of Session   Activity Tolerance: Patient tolerated treatment well Patient left: in chair;with call bell/phone within reach;with chair alarm set Nurse Communication: Mobility status PT Visit Diagnosis: Other abnormalities of gait and mobility (R26.89);Muscle weakness (generalized) (M62.81);Other symptoms and signs involving the nervous system (R29.898)     Time: [id number] PT Time Calculation (min) (ACUTE ONLY): 21 min  Charges:  $Gait Training: 8-22 mins                     [name], PT, DPT Acute Rehabilitation Services Pager #: [phone] Office #: [phone]    [name] 09/08/2019, 11:13 AM

## 2019-09-08 NOTE — Progress Notes (Addendum)
NAME:  Haley Mayo, MRN:  [id number], DOB:  1951/02/23, LOS: 8 ADMISSION DATE:  08/31/2019, CONSULTATION DATE:  09/01/2019 REFERRING MD:  Dr. Kathyrn Sheriff, CHIEF COMPLAINT:  SAH, PE   Brief History   69 year old female with PMHx of HTN, hyperlipidemia, and smoking history originally admitted at [facility] for CAP on 5/12 and developed headache and diplopia, found to have SAH.Transferred to [facility] for further neurosurgical care. Incidentally found to have PE on CTA. As of 5/25, patient is clinically improved and stable and appropriate for transfer to floor.  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 5/19 - started hemodynamic 3 vessel vasospasm.  5/19 - IVC filter placement 5/21 awake, oriented. Moving all ext. Doing well. Perhaps small right drift 5/24 - awake, verbal. On repeat AM exam, less alert and c/o HA. Unremarkable CT head. 5/25 - transfer to floor    Significant Diagnostic Tests:  5/16 [facility] >>  Aneurysmal pattern subarachnoid hemorrhage over both convexities and extending into the ventricles and basal cisterns. 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 CTA of the head recommended for better characterization.  5/16 CTA head/ neck >> 1. Unchanged distribution of subarachnoid hemorrhage. 2. 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 3. Right upper lobar pulmonary artery embolus, incompletely visualized. 4. Aortic Atherosclerosis  2.3 cm right thyroid nodule  5/16 CTA PE >> 1. Filling defect within the proximal right upper lobe apical and posterior segmental arteries, consistent with pulmonary embolus. 2. Enlarged main pulmonary artery, consistent with pulmonary hypertension. 3. Moderate cardiomegaly. 4. Aortic Atherosclerosis (ICD10-I70.0). 5. 2.3 cm right thyroid heterogeneous nodule.     5/17 TCDs 5/17 b/l LE doppler study >> limited study, no evidence of DVT   5/18 CTA: continued severe spasm of bilateral A1/A2, M1/M2, and basilar arteries. Stent construct appears patent with spasm involving PCA as well. No new hemorrhage or HCP  5/24 CT H:  1. Resolving subarachnoid hemorrhage.  No hydrocephalus 2. Interval stenting and coiling of basilar tip aneurysm. No new hemorrhage or infarction.  Micro Data:  5/12 SARS 2 >> neg 5/14 UC >> neg 5/14 BC [facility]) >>  5/16 BCx 2 >> ngtd  Antimicrobials:  5/12 azithro >> 5/16 5/12 ceftriaxone >> 5/15 5/16 cefepime >> 5/17 5/16 vanc  Interim history/subjective:  Pt reports that she is feeling "OK" today. She endorses continued headache that is located at the crown and is 7.5/10 intensity. Describes constant feeling of HA rather than acute worsening. She denies new vision changes, abdominal pain, chest pain, dyspnea. Notes soreness in buttocks and legs from lying in bed in the same position.   Objective   Blood pressure 135/73, pulse 78, temperature 98 F (36.7 C), temperature source Oral, resp. rate (!) 23, weight 73.1 kg, SpO2 94 %.        Intake/Output Summary (Last 24 hours) at 09/08/2019 0901 Last data filed at 09/08/2019 0800 Gross per 24 hour  Intake 1806.67 ml  Output 1090 ml  Net 716.67 ml   Filed Weights   09/03/19 0500 09/04/19 0500 09/08/19 0500  Weight: 73 kg 73 kg 73.1 kg    Examination: General: resting comfortably, no acute distress, somewhat sleepy and less alert than yesterday HEENT: no lymphadenopathy, anicteric sclerae Lungs: Clear lungs, normal respiratory effort, no accessory  muscle use Cardiovascular: RRR, no m/r/g Abdomen: Non-tender, active bowel sounds, no rebound/guarding Extremities: Full ROM of upper and lower extremities, 2+ pulses.  Neuro: Alert and oriented x3. Normal CN I-XII. 5/5 strength of LE and 4/5 strength of UE. 2+ reflexes and normal sensation throughout. No pronator  drift GU: Deferred  Resolved Hospital Problem list    Assessment & Plan:  Critically ill due to cerebral vasospasm from aneurysmal SAH requiring initiation of vasopressors and fluid resuscitation.  Patient appears to be clinically improving but with waxing and waning mentation and headache, which in the setting of unchanged CT head is suggestive of delirium and/or encephalopathy 2/2 SAH. Likely exiting vasospasm window. Plan - Target systolic blood pressure 0000000 - Serial TCDs - q4h neuro checks - Cont nimodipine - Tylenol, percocet PRNs for pain - Phenylephrine discontinued 5/24 - Neurosurg following, appreciate recs - PT/OT/speech consult  RUL PE due to immobility following SAH. No clearly associated pneumonia. Pt is sedentary at baseline with hx of tobacco abuse, and obesity as risk factors for provoked PE. Currently s/p IVC filter. Plan - Weaned off of supplemental O2 by NS with goal sats > 92%. 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 Plan Continue DAPT for stent  HTN  Plan - Labetalol PRN  Leukocytosis ? Reactive: WBC downtrending at 18.7 from 25.5 Hb 10.9 (<--12). Plan Continue to trend CBC  Incidental right thyroid nodule  Plan Outpt [place] +/- FNA  Fluid and electrolyte imbalance: Nl volume status on exam, electrolytes wnl. Plan - Continue to replace and recheck as indicated. - Discontinue IVF  Nutrition:  Plan - Per Nutrition, Enlive PO BID, Magic cup TID with meals. - Encourage PO intake  Hyperglycemia Plan SSI  Best practice:  Pain/Anxiety/Delirium protocol (if indicated): prn only Neuro vitals: every 4 hours AED's: none VAP protocol (if indicated): not intubated Respiratory support goals: HOB 45  Blood pressure target: MAP 110-115 as significant whip to arterial line makes Systolic less reliable.  DVT  prophylaxis: heparin tid Nutrition Status: Encourage PO intake; nutrition consulted GI prophylaxis: protonix Fluid status goals: CVP monitoring, keep euvolemic Urinary catheter: Guide hemodynamic management Central lines: left [place] triple lumen Glucose control: SSI insulin.  Mobility/therapy needs: bedrest  Antibiotic de-escalation: complete 7 days for CAP vs Aspiration  Home medication reconciliation: on hold Daily labs: BMET bid, CBC daily Code Status: full Family Communication:  Updated son 5/24 Disposition: ICU  Labs   CBC: Recent Labs  Lab 09/04/19 0710 09/05/19 [id number] 09/06/19 0618 09/07/19 0442 09/08/19 0722  WBC 23.7* 26.7* 28.9* 25.5* 18.7*  HGB 12.1 12.4 12.8 12.0 10.9*  HCT 36.2 37.7 39.0 37.0 33.9*  MCV 83.2 84.3 85.7 85.6 85.0  PLT 369 394 468* 444* 402*    Basic Metabolic Panel: Recent Labs  Lab 09/02/19 1730 09/02/19 1730 09/03/19 0650 09/03/19 0650 09/03/19 1545 09/03/19 1545 09/04/19 0710 09/04/19 1755 09/06/19 0618 09/07/19 0442 09/07/19 0728 09/08/19 0722  NA 140   < > 142   < > 140   < > 139 136 137 138  --  136  K 2.0*   < > 2.2*   < > 3.1*   < > 2.7* 4.1 3.5 3.9  --  4.2  CL 105   < > 108   < > 103   < > 100 100 99 101  --  103  CO2 24   < > 25   < > 25   < > 29 28 30 27   --  25  GLUCOSE 228*   < > 193*   < > 206*   < > 173* 190* 155* 137*  --  126*  BUN 8   < > 9   < > 7*   < > 7* 8 14 14   --  13  CREATININE 0.55   < > 0.46   < > 0.40*   < > 0.44 0.41* 0.40* 0.43*  --  0.41*  CALCIUM 7.7*   < > 7.6*   < > 7.7*   < > 8.3* 8.6* 9.1 9.0  --  9.2  MG 2.3  --  1.7  --  2.5*  --   --   --   --   --  1.8 1.7  PHOS 1.8*  --  1.6*  --  3.1  --   --   --   --   --  4.2 3.7   < > = values in this interval not displayed.   GFR: Estimated Creatinine Clearance: 60.9 mL/min (A) (by C-G formula based on SCr of 0.41 mg/dL (L)). Recent Labs  Lab 09/05/19 0613 09/06/19 0618 09/07/19 0442 09/08/19 0722  WBC 26.7* 28.9* 25.5* 18.7*    Liver Function  Tests: No results for input(s): AST, ALT, ALKPHOS, BILITOT, PROT, ALBUMIN in the last 168 hours. No results for input(s): LIPASE, AMYLASE in the last 168 hours. No results for input(s): AMMONIA in the last 168 hours.  ABG No results found for: PHART, PCO2ART, PO2ART, HCO3, TCO2, ACIDBASEDEF, O2SAT   Coagulation Profile: No results for input(s): INR, PROTIME in the last 168 hours.  Cardiac Enzymes: No results for input(s): CKTOTAL, CKMB, CKMBINDEX, TROPONINI in the last 168 hours.  HbA1C: Hgb A1c MFr Bld  Date/Time Value Ref Range Status  09/02/2019 09:46 AM 6.7 (H) 4.8 - 5.6 % Final    Comment:    (NOTE) Pre diabetes:          5.7%-6.4% Diabetes:              >6.4% Glycemic control for   <7.0% adults with diabetes   01/25/2016 10:20 AM 6.4 (H) 4.8 - 5.6 % Final    Comment:             Pre-diabetes: 5.7 - 6.4          Diabetes: >6.4          Glycemic control for adults with diabetes: <7.0     CBG: Recent Labs  Lab 09/07/19 1543 09/07/19 1958 09/07/19 2352 09/08/19 0405 09/08/19 0806  GLUCAP 146* 118* 212* 96 120*     Past Medical History  She,  has a past medical history of HLD (hyperlipidemia), HTN (hypertension), Leukocytosis, Neoplasm of ear, Obesity, and Wears dentures.   Surgical History    Past Surgical History:  Procedure Laterality Date  . ABDOMINAL HYSTERECTOMY  Age 29  . COLONOSCOPY  09/2004   normal, rpt 10 yrs  . EAR CYST EXCISION Left 09/27/2017   Procedure: EXCISION EAR LOBE CYST;  Surgeon: Beverly Gust, MD;  Location: [facility];  Service: ENT;  Laterality: Left;  LOCAL ONLY  . IR IVC FILTER PLMT / S&I /IMG GUID/MOD SED  09/02/2019  . OOPHORECTOMY    . RADIOLOGY WITH ANESTHESIA N/A 09/01/2019   Procedure: IR WITH ANESTHESIA;  Surgeon: Consuella Lose, MD;  Location:  [facility];  Service: Radiology;  Laterality: N/A;  . TOTAL VAGINAL HYSTERECTOMY  05/2001   heavy bleeding, fibroids     Social History   reports that she has been  smoking cigarettes. She has a 40.00 pack-year smoking history. She uses smokeless tobacco. She reports that she does not drink alcohol or use drugs.   Family History   Her family history includes Diabetes in her sister and sister; Lupus in her sister and sister; Other in her sister; Pneumonia in her mother; Rheum arthritis in her sister; Stroke in her brother and sister; Ulcers in her father.   Allergies No Known Allergies   Home Medications  Prior to Admission medications   Medication Sig Start Date End Date Taking? Authorizing Provider  acetaminophen (TYLENOL) 325 MG tablet Take 2 tablets (650 mg total) by mouth every 6 (six) hours as needed for mild pain, fever or headache. 08/30/19  Yes Athena Masse, MD  amLODipine-benazepril (LOTREL) 10-20 MG capsule Take 1 capsule by mouth every evening.    Yes [provider]  Coenzyme Q10-Omega 3 Fatty Acd (HEALTHY HEART) EMUL Take 1 capsule by mouth daily.     Yes [provider]  Multiple Vitamins-Minerals (EMERGEN-C VITAMIN C PO) Take 1 tablet by mouth daily.    Yes [provider]  amLODipine (NORVASC) 10 MG tablet Take 1 tablet (10 mg total) by mouth daily. Patient not taking: Reported on 08/31/2019 08/31/19   Athena Masse, MD  benazepril (LOTENSIN) 20 MG tablet Take 1 tablet (20 mg total) by mouth daily. Patient not taking: Reported on 08/31/2019 08/31/19   Athena Masse, MD  ceFEPIme 2 g in sodium chloride 0.9 % 100 mL Inject 2 g into the vein every 8 (eight) hours. Patient not taking: Reported on 08/31/2019 08/30/19   Athena Masse, MD  enoxaparin (LOVENOX) 40 MG/0.4ML injection Inject 0.4 mLs (40 mg total) into the skin daily. Patient not taking: Reported on 08/31/2019 08/31/19   Athena Masse, MD  niMODipine (NIMOTOP) 30 MG capsule Take 2 capsules (60 mg total) by mouth every 4 (four) hours. Patient not taking: Reported on 08/31/2019 08/30/19   Athena Masse, MD  polyethylene glycol (MIRALAX / GLYCOLAX) 17 g  packet Take 17 g by mouth daily. Patient not taking: Reported on 08/31/2019 08/31/19   Athena Masse, MD  vancomycin (VANCOREADY) 1250 MG/250ML SOLN Inject 250 mLs (1,250 mg total) into the vein daily. Patient not taking: Reported on 08/31/2019 08/30/19   Athena Masse, MD     Critical care time:

## 2019-09-09 ENCOUNTER — Inpatient Hospital Stay (HOSPITAL_COMMUNITY): Payer: Medicare Other

## 2019-09-09 DIAGNOSIS — I609 Nontraumatic subarachnoid hemorrhage, unspecified: Secondary | ICD-10-CM

## 2019-09-09 LAB — BASIC METABOLIC PANEL
Anion gap: 12 (ref 5–15)
BUN: 13 mg/dL (ref 8–23)
CO2: 25 mmol/L (ref 22–32)
Calcium: 9.3 mg/dL (ref 8.9–10.3)
Chloride: 99 mmol/L (ref 98–111)
Creatinine, Ser: 0.53 mg/dL (ref 0.44–1.00)
GFR calc Af Amer: >60 mL/min (ref >60)
GFR calc non Af Amer: >60 mL/min (ref >60)
Glucose, Bld: 133 mg/dL — ABNORMAL HIGH (ref 70–99)
Potassium: 4.2 mmol/L (ref 3.5–5.1)
Sodium: 136 mmol/L (ref 135–145)

## 2019-09-09 LAB — GLUCOSE, CAPILLARY
Glucose-Capillary: 134 mg/dL — ABNORMAL HIGH (ref 70–99)
Glucose-Capillary: 128 mg/dL — ABNORMAL HIGH (ref 70–99)
Glucose-Capillary: 152 mg/dL — ABNORMAL HIGH (ref 70–99)
Glucose-Capillary: 130 mg/dL — ABNORMAL HIGH (ref 70–99)

## 2019-09-09 LAB — CBC
HCT: 35.5 % — ABNORMAL LOW (ref 36.0–46.0)
Hemoglobin: 11.5 g/dL — ABNORMAL LOW (ref 12.0–15.0)
MCH: 27.6 pg (ref 26.0–34.0)
MCHC: 32.4 g/dL (ref 30.0–36.0)
MCV: 85.3 fL (ref 80.0–100.0)
Platelets: 442 10*3/uL — ABNORMAL HIGH (ref 150–400)
RBC: 4.16 MIL/uL (ref 3.87–5.11)
RDW: 15.9 % — ABNORMAL HIGH (ref 11.5–15.5)
WBC: 16.7 10*3/uL — ABNORMAL HIGH (ref 4.0–10.5)
nRBC: 0 % (ref 0.0–0.2)

## 2019-09-09 MED ORDER — SODIUM CHLORIDE 0.9 % IV SOLN: INTRAVENOUS | Status: DC
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
## 2019-09-09 NOTE — Progress Notes (Signed)
NAME:  Haley Mayo, MRN:  [id number], DOB:  02/14/1951, LOS: 9 ADMISSION DATE:  08/31/2019, CONSULTATION DATE:  09/01/2019 REFERRING MD:  Dr. Kathyrn Sheriff, CHIEF COMPLAINT:  SAH, PE   Brief History   69 year old female with PMHx of HTN, hyperlipidemia, and smoking history originally admitted at [facility] for CAP on 5/12 and developed headache and diplopia, found to have SAH.Transferred to [facility] for further neurosurgical care. Incidentally found to have PE on CTA. As of 5/25, patient is clinically improving and stable, and appropriate for transfer to floor.  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 5/19 - started hemodynamic 3 vessel vasospasm.  5/19 - IVC filter placement 5/21 awake, oriented. Moving all ext. Doing well. Perhaps small right drift 5/24 - awake, verbal. On repeat AM exam, less alert and c/o HA. Unremarkable CT head. 5/26 - facilitating transfer to floor    Significant Diagnostic Tests:  5/16 [facility] >>  Aneurysmal pattern subarachnoid hemorrhage over both convexities and extending into the ventricles and basal cisterns. 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 CTA of the head recommended for better characterization.  5/16 CTA head/ neck >> 1. Unchanged distribution of subarachnoid hemorrhage. 2. 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 3. Right upper lobar pulmonary artery embolus, incompletely visualized. 4. Aortic Atherosclerosis  2.3 cm right thyroid nodule  5/16 CTA PE >> 1. Filling defect within the proximal right upper lobe apical and posterior segmental arteries, consistent with pulmonary embolus. 2. Enlarged main pulmonary artery, consistent with pulmonary hypertension. 3. Moderate cardiomegaly. 4. Aortic Atherosclerosis (ICD10-I70.0). 5. 2.3 cm right thyroid  heterogeneous nodule.   5/17 TCDs 5/17 b/l LE doppler study >> limited study, no evidence of DVT   5/18 CTA: continued severe spasm of bilateral A1/A2, M1/M2, and basilar arteries. Stent construct appears patent with spasm involving PCA as well. No new hemorrhage or HCP  5/24 CT H:  1. Resolving subarachnoid hemorrhage.  No hydrocephalus 2. Interval stenting and coiling of basilar tip aneurysm. No new hemorrhage or infarction.  Micro Data:  5/12 SARS 2 >> neg 5/14 UC >> neg 5/14 BC [facility]) >>  5/16 BCx 2 >> ngtd  Antimicrobials:  5/12 azithro >> 5/16 5/12 ceftriaxone >> 5/15 5/16 cefepime >> 5/17 5/16 vanc  Interim history/subjective:  Pt reports that she is feeling "fine" today. She denies pain, discomfort, HA, chest pain, dyspnea, nausea/vomiting. States that she has been eating breakfast and wondering about her plan of care. Continues to occasionally say unusual statements, such as "What is my carbo count?" and "[facility] is everywhere in [place]."  Objective   Blood pressure 135/76, pulse 75, temperature 98.3 F (36.8 C), temperature source Oral, resp. rate 16, weight 73 kg, SpO2 96 %.        Intake/Output Summary (Last 24 hours) at 09/09/2019 1048 Last data filed at 09/09/2019 0800 Gross per 24 hour  Intake 1470.71 ml  Output 2425 ml  Net -954.29 ml   Filed Weights   09/04/19 0500 09/08/19 0500 09/09/19 0500  Weight: 73 kg 73.1 kg 73 kg    Examination: General: resting comfortably, no acute distress, alert, sitting upright and eating breakfast HEENT: no lymphadenopathy, anicteric sclerae Lungs: Clear lungs, normal respiratory effort, no accessory muscle use Cardiovascular: RRR, no m/r/g Abdomen: Non-tender,  active bowel sounds, no rebound/guarding Extremities: Full ROM of upper and lower extremities, 2+ pulses.  Neuro: Alert and oriented x3. Normal CN I-XII. 5/5 strength of LE and 4/5 strength of UE. 2+ reflexes and normal sensation throughout. No pronator  drift GU: Deferred  Resolved Hospital Problem list    Assessment & Plan:  Critically ill due to cerebral vasospasm from aneurysmal SAH requiring initiation of vasopressors and fluid resuscitation.  Patient appears to be clinically improving with waxing and waning mentation and headache, which in the setting of unchanged CT head is suggestive of delirium and/or encephalopathy 2/2 SAH. Plan - Target systolic blood pressure 0000000 - Serial TCDs - q4h neuro checks - Cont nimodipine - Tylenol, percocet PRNs for pain - Neurosurg following, appreciate recs - PT/OT/speech consult  RUL PE due to immobility following SAH. No clearly associated pneumonia. Pt is sedentary at baseline with hx of tobacco abuse, and obesity as risk factors for provoked PE. Currently s/p IVC filter. Plan - Weaned off of supplemental O2 by NS with goal sats > 92%. 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 Plan Continue DAPT for stent  HTN  Plan - Labetalol PRN  Leukocytosis ? Reactive: WBC downtrending at 16.7 (<--18.7 <-- 25.5). Plan Continue to trend CBC  Incidental right thyroid nodule  Plan Outpt [place] +/- FNA  Fluid and electrolyte imbalance: Nl volume status on exam, electrolytes wnl. Plan - Continue to replace and recheck as indicated. - Foley in place due to failed ToV  Nutrition:  Plan - Per Nutrition, Enlive PO BID, Magic cup TID with meals. - Encourage PO intake  Hyperglycemia Plan SSI  Best practice:  Pain/Anxiety/Delirium protocol (if indicated): prn only Neuro vitals: every 4 hours AED's: none VAP protocol (if indicated): not intubated Respiratory support goals: HOB 45  Blood pressure target: MAP 110-115 as significant whip to arterial line makes Systolic less reliable.  DVT prophylaxis: heparin tid Nutrition Status: Encourage PO intake;  nutrition consulted GI prophylaxis: protonix Fluid status goals: CVP monitoring, keep euvolemic Urinary catheter: Guide hemodynamic management Central lines: left [place] triple lumen Glucose control: SSI insulin.  Mobility/therapy needs: bedrest  Antibiotic de-escalation: complete 7 days for CAP vs Aspiration  Home medication reconciliation: on hold Daily labs: BMET bid, CBC daily Code Status: full Family Communication:  Updated son 5/24 Disposition: ICU  Labs   CBC: Recent Labs  Lab 09/05/19 0613 09/06/19 0618 09/07/19 0442 09/08/19 0722 09/09/19 0614  WBC 26.7* 28.9* 25.5* 18.7* 16.7*  HGB 12.4 12.8 12.0 10.9* 11.5*  HCT 37.7 39.0 37.0 33.9* 35.5*  MCV 84.3 85.7 85.6 85.0 85.3  PLT 394 468* 444* 402* 442*    Basic Metabolic Panel: Recent Labs  Lab 09/02/19 1730 09/02/19 1730 09/03/19 0650 09/03/19 0650 09/03/19 1545 09/04/19 0710 09/04/19 1755 09/06/19 0618 09/07/19 0442 09/07/19 0728 09/08/19 0722 09/09/19 0723  NA 140   < > 142   < > 140   < > 136 137 138  --  136 136  K 2.0*   < > 2.2*   < > 3.1*   < > 4.1 3.5 3.9  --  4.2 4.2  CL 105   < > 108   < > 103   < > 100 99 101  --  103 99  CO2 24   < > 25   < > 25   < >  28 30 27   --  25 25  GLUCOSE 228*   < > 193*   < > 206*   < > 190* 155* 137*  --  126* 133*  BUN 8   < > 9   < > 7*   < > 8 14 14   --  13 13  CREATININE 0.55   < > 0.46   < > 0.40*   < > 0.41* 0.40* 0.43*  --  0.41* 0.53  CALCIUM 7.7*   < > 7.6*   < > 7.7*   < > 8.6* 9.1 9.0  --  9.2 9.3  MG 2.3  --  1.7  --  2.5*  --   --   --   --  1.8 1.7  --   PHOS 1.8*  --  1.6*  --  3.1  --   --   --   --  4.2 3.7  --    < > = values in this interval not displayed.   GFR: Estimated Creatinine Clearance: 60.9 mL/min (by C-G formula based on SCr of 0.53 mg/dL). Recent Labs  Lab 09/06/19 0618 09/07/19 0442 09/08/19 0722 09/09/19 0614  WBC 28.9* 25.5* 18.7* 16.7*    Liver Function Tests: No results for input(s): AST, ALT, ALKPHOS, BILITOT, PROT,  ALBUMIN in the last 168 hours. No results for input(s): LIPASE, AMYLASE in the last 168 hours. No results for input(s): AMMONIA in the last 168 hours.  ABG No results found for: PHART, PCO2ART, PO2ART, HCO3, TCO2, ACIDBASEDEF, O2SAT   Coagulation Profile: No results for input(s): INR, PROTIME in the last 168 hours.  Cardiac Enzymes: No results for input(s): CKTOTAL, CKMB, CKMBINDEX, TROPONINI in the last 168 hours.  HbA1C: Hgb A1c MFr Bld  Date/Time Value Ref Range Status  09/02/2019 09:46 AM 6.7 (H) 4.8 - 5.6 % Final    Comment:    (NOTE) Pre diabetes:          5.7%-6.4% Diabetes:              >6.4% Glycemic control for   <7.0% adults with diabetes   01/25/2016 10:20 AM 6.4 (H) 4.8 - 5.6 % Final    Comment:             Pre-diabetes: 5.7 - 6.4          Diabetes: >6.4          Glycemic control for adults with diabetes: <7.0     CBG: Recent Labs  Lab 09/08/19 1521 09/08/19 1910 09/08/19 2335 09/09/19 0306 09/09/19 0741  GLUCAP 151* 99 130* 134* 128*     Past Medical History  She,  has a past medical history of HLD (hyperlipidemia), HTN (hypertension), Leukocytosis, Neoplasm of ear, Obesity, and Wears dentures.   Surgical History    Past Surgical History:  Procedure Laterality Date  . ABDOMINAL HYSTERECTOMY  Age 79  . COLONOSCOPY  09/2004   normal, rpt 10 yrs  . EAR CYST EXCISION Left 09/27/2017   Procedure: EXCISION EAR LOBE CYST;  Surgeon: Beverly Gust, MD;  Location: [place];  Service: ENT;  Laterality: Left;  LOCAL ONLY  . IR ANGIO VERTEBRAL SEL VERTEBRAL UNI L MOD SED  09/01/2019  . IR ANGIOGRAM FOLLOW UP STUDY  09/01/2019  . IR ANGIOGRAM FOLLOW UP STUDY  09/01/2019  . IR ANGIOGRAM FOLLOW UP STUDY  09/01/2019  . IR ANGIOGRAM FOLLOW UP STUDY  09/01/2019  . IR ANGIOGRAM FOLLOW UP STUDY  09/01/2019  .  IR IVC FILTER PLMT / S&I /IMG GUID/MOD SED  09/02/2019  . IR NEURO EACH ADD'L AFTER BASIC UNI LEFT (MS)  09/01/2019  . IR TRANSCATH/EMBOLIZ   09/01/2019  . OOPHORECTOMY    . RADIOLOGY WITH ANESTHESIA N/A 09/01/2019   Procedure: IR WITH ANESTHESIA;  Surgeon: Consuella Lose, MD;  Location: [place];  Service: Radiology;  Laterality: N/A;  . TOTAL VAGINAL HYSTERECTOMY  05/2001   heavy bleeding, fibroids     Social History   reports that she has been smoking cigarettes. She has a 40.00 pack-year smoking history. She uses smokeless tobacco. She reports that she does not drink alcohol or use drugs.   Family History   Her family history includes Diabetes in her sister and sister; Lupus in her sister and sister; Other in her sister; Pneumonia in her mother; Rheum arthritis in her sister; Stroke in her brother and sister; Ulcers in her father.   Allergies No Known Allergies   Home Medications  Prior to Admission medications   Medication Sig Start Date End Date Taking? Authorizing Provider  acetaminophen (TYLENOL) 325 MG tablet Take 2 tablets (650 mg total) by mouth every 6 (six) hours as needed for mild pain, fever or headache. 08/30/19  Yes Athena Masse, MD  amLODipine-benazepril (LOTREL) 10-20 MG capsule Take 1 capsule by mouth every evening.    Yes [provider]  Coenzyme Q10-Omega 3 Fatty Acd (HEALTHY HEART) EMUL Take 1 capsule by mouth daily.     Yes [provider]  Multiple Vitamins-Minerals (EMERGEN-C VITAMIN C PO) Take 1 tablet by mouth daily.    Yes [provider]  amLODipine (NORVASC) 10 MG tablet Take 1 tablet (10 mg total) by mouth daily. Patient not taking: Reported on 08/31/2019 08/31/19   Athena Masse, MD  benazepril (LOTENSIN) 20 MG tablet Take 1 tablet (20 mg total) by mouth daily. Patient not taking: Reported on 08/31/2019 08/31/19   Athena Masse, MD  ceFEPIme 2 g in sodium chloride 0.9 % 100 mL Inject 2 g into the vein every 8 (eight) hours. Patient not taking: Reported on 08/31/2019 08/30/19   Athena Masse, MD  enoxaparin (LOVENOX) 40 MG/0.4ML injection Inject 0.4 mLs (40 mg total)  into the skin daily. Patient not taking: Reported on 08/31/2019 08/31/19   Athena Masse, MD  niMODipine (NIMOTOP) 30 MG capsule Take 2 capsules (60 mg total) by mouth every 4 (four) hours. Patient not taking: Reported on 08/31/2019 08/30/19   Athena Masse, MD  polyethylene glycol (MIRALAX / GLYCOLAX) 17 g packet Take 17 g by mouth daily. Patient not taking: Reported on 08/31/2019 08/31/19   Athena Masse, MD  vancomycin (VANCOREADY) 1250 MG/250ML SOLN Inject 250 mLs (1,250 mg total) into the vein daily. Patient not taking: Reported on 08/31/2019 08/30/19   Athena Masse, MD     Critical care time:

## 2019-09-09 NOTE — Progress Notes (Signed)
Inpatient Rehab Admissions Coordinator:   I have no beds available for this patient to admit to CIR today.  Will continue to follow for timing of potential admission pending bed availability.   Shann Medal, PT, DPT Admissions Coordinator [phone] 09/09/19  9:50 AM

## 2019-09-09 NOTE — Progress Notes (Signed)
Transcranial Doppler  Date POD PCO2 HCT BP  MCA ACA PCA OPHT SIPH VERT Basilar  5/17 GC     Right  Left   *  *   *  *   *  *   12  25   32  55   -43  -29   *      5/19,rs     Right  Left   *  *   *  *   *  *   65  43   88  103   -55  -31   -84      5/21 GC     Right  Left   *  *   *  *   *  *   13  22   23  24    *  *   *      5/24 GC      Right  Left   *  *   *  *   *  *   13  16   21  27    *  *   *      5/26 JP      Right  Left   *  *   *  *   *  *   27  26   41  36   *     *           Right  Left                                            Right  Left                                        MCA = Middle Cerebral Artery      OPHT = Opthalmic Artery     BASILAR = Basilar Artery   ACA = Anterior Cerebral Artery     SIPH = Carotid Siphon PCA = Posterior Cerebral Artery   VERT = Verterbral Artery                   Normal MCA = 62+\-12 ACA = 50+\-12 PCA = 42+\-23   (*) no window  - not insonated.   09/09/19 12:34 PM
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
## 2019-09-09 NOTE — Progress Notes (Signed)
  Speech Language Pathology Treatment: Dysphagia;Cognitive-Linquistic  Patient Details Name: Haley Mayo MRN: [id number] DOB: 1951/02/27 Today's Date: 09/09/2019 Time: [id number] SLP Time Calculation (min) (ACUTE ONLY): 23 min  Assessment / Plan / Recommendation Clinical Impression  Pt was seen OOB in her chair today, distracted internally by pain (RN aware). Despite missing dentition, she masticated soft solids well without the presence of any oral residue when given extra time. No overt s/s of aspiration noted. Recommend advancement to mechanical soft solids, continuing thin liquids. She will benefit from set-up assist at meal times, followed by intermittent supervision as she eats.   Cognitively she needs Mod cues for sustained attention and simple verbal problem solving related to functional money management. Mod cues were also used for sustained attention to topic of conversation. Mod-Max cues were given for working memory and recall of daily events. She will benefit from CIR-level therapy to maximize cognition and safety.    HPI HPI: Pt is 69 yo female that presented to ED for generalized weakness, pt stated she has been down on the floor at home since 5/7. Reported recent [employer] [place] vaccination (5/3). PMH of HLD, HTN. On 5/16 pt with c/o diplopia. CT of head showed SAH 51mm basilar tip aneurysm.  Pt transferred to [place] [facility] for neurosurgery consult. Pt underwent diagnostic cerebral angiogram with stent-supported coil embolization of basila aneurysm on 5/18. Pt underwent IVC filter placement on 5/19.      SLP Plan  Continue with current plan of care       Recommendations  Diet recommendations: Dysphagia 3 (mechanical soft);Thin liquid Liquids provided via: Cup;Straw Medication Administration: Whole meds with puree Supervision: Patient able to self feed;Intermittent supervision to cue for compensatory strategies Compensations: Slow rate;Small sips/bites Postural Changes and/or Swallow  Maneuvers: Seated upright 90 degrees                Oral Care Recommendations: Oral care BID Follow up Recommendations: Inpatient Rehab SLP Visit Diagnosis: Dysphagia, unspecified (R13.10);Cognitive communication deficit (R41.841) Plan: Continue with current plan of care       GO                 Osie Bond., M.A. [place] Acute Rehabilitation Services Pager [phone] Office [phone]  09/09/2019, 12:49 PM

## 2019-09-09 NOTE — Progress Notes (Signed)
  NEUROSURGERY PROGRESS NOTE   No issues overnight.  No concerns this am Excited her son is coming in from [place] to help care for her  EXAM:  BP (!) 142/81 (BP Location: Left Arm)   Pulse 81   Temp 98.4 F (36.9 C) (Oral)   Resp (!) 24   Wt 73 kg   SpO2 96%   BMI 31.43 kg/m   Awake, alert, oriented to self, location, year Speech slow, Somewhat confused at times  CN grossly intact  MAEW, seemingly nonfocal exam Mild right drift  IMPRESSION/PLAN 69 y.o. female SAH d#14 s/p stent-coil fusiform basilar aneurysm, severe symptomatic spasm now resolved. Small PE, asymptomatic. S/P IVC filter placement - continue ASA/Plavix for stent - continue supportive care, nimotop - PT/OT/SLP - Dispo planning

## 2019-09-10 ENCOUNTER — Inpatient Hospital Stay (HOSPITAL_COMMUNITY)
Admission: RE | Admit: 2019-09-10 | Discharge: 2019-10-01 | DRG: 056 | Disposition: A | Discharge status: Home / Self Care | Payer: Medicare Other | Source: Intra-hospital | Attending: Physical Medicine & Rehabilitation | Admitting: Physical Medicine & Rehabilitation

## 2019-09-10 ENCOUNTER — Inpatient Hospital Stay (HOSPITAL_COMMUNITY): Payer: Medicare Other

## 2019-09-10 ENCOUNTER — Encounter (HOSPITAL_COMMUNITY): Payer: Self-pay | Admitting: Physical Medicine & Rehabilitation

## 2019-09-10 DIAGNOSIS — R339 Retention of urine, unspecified: Secondary | ICD-10-CM | POA: Diagnosis present

## 2019-09-10 DIAGNOSIS — Z90710 Acquired absence of both cervix and uterus: Secondary | ICD-10-CM | POA: Diagnosis not present

## 2019-09-10 DIAGNOSIS — G441 Vascular headache, not elsewhere classified: Secondary | ICD-10-CM

## 2019-09-10 DIAGNOSIS — I69091 Dysphagia following nontraumatic subarachnoid hemorrhage: Secondary | ICD-10-CM

## 2019-09-10 DIAGNOSIS — L89152 Pressure ulcer of sacral region, stage 2: Secondary | ICD-10-CM | POA: Diagnosis present

## 2019-09-10 DIAGNOSIS — I69098 Other sequelae following nontraumatic subarachnoid hemorrhage: Principal | ICD-10-CM

## 2019-09-10 DIAGNOSIS — R1310 Dysphagia, unspecified: Secondary | ICD-10-CM | POA: Diagnosis present

## 2019-09-10 DIAGNOSIS — I69391 Dysphagia following cerebral infarction: Secondary | ICD-10-CM

## 2019-09-10 DIAGNOSIS — I67848 Other cerebrovascular vasospasm and vasoconstriction: Secondary | ICD-10-CM | POA: Diagnosis present

## 2019-09-10 DIAGNOSIS — Z8261 Family history of arthritis: Secondary | ICD-10-CM | POA: Diagnosis not present

## 2019-09-10 DIAGNOSIS — I609 Nontraumatic subarachnoid hemorrhage, unspecified: Secondary | ICD-10-CM | POA: Diagnosis not present

## 2019-09-10 DIAGNOSIS — I119 Hypertensive heart disease without heart failure: Secondary | ICD-10-CM | POA: Diagnosis present

## 2019-09-10 DIAGNOSIS — F17210 Nicotine dependence, cigarettes, uncomplicated: Secondary | ICD-10-CM | POA: Diagnosis present

## 2019-09-10 DIAGNOSIS — F05 Delirium due to known physiological condition: Secondary | ICD-10-CM | POA: Diagnosis not present

## 2019-09-10 DIAGNOSIS — D72829 Elevated white blood cell count, unspecified: Secondary | ICD-10-CM

## 2019-09-10 DIAGNOSIS — Z823 Family history of stroke: Secondary | ICD-10-CM

## 2019-09-10 DIAGNOSIS — Z95828 Presence of other vascular implants and grafts: Secondary | ICD-10-CM | POA: Diagnosis not present

## 2019-09-10 DIAGNOSIS — Z79899 Other long term (current) drug therapy: Secondary | ICD-10-CM

## 2019-09-10 DIAGNOSIS — Z832 Family history of diseases of the blood and blood-forming organs and certain disorders involving the immune mechanism: Secondary | ICD-10-CM

## 2019-09-10 DIAGNOSIS — R410 Disorientation, unspecified: Secondary | ICD-10-CM | POA: Diagnosis not present

## 2019-09-10 DIAGNOSIS — I952 Hypotension due to drugs: Secondary | ICD-10-CM

## 2019-09-10 DIAGNOSIS — Z833 Family history of diabetes mellitus: Secondary | ICD-10-CM | POA: Diagnosis not present

## 2019-09-10 DIAGNOSIS — I2699 Other pulmonary embolism without acute cor pulmonale: Secondary | ICD-10-CM | POA: Diagnosis present

## 2019-09-10 DIAGNOSIS — E785 Hyperlipidemia, unspecified: Secondary | ICD-10-CM | POA: Diagnosis present

## 2019-09-10 LAB — GLUCOSE, CAPILLARY
Glucose-Capillary: 112 mg/dL — ABNORMAL HIGH (ref 70–99)
Glucose-Capillary: 125 mg/dL — ABNORMAL HIGH (ref 70–99)
Glucose-Capillary: 127 mg/dL — ABNORMAL HIGH (ref 70–99)
Glucose-Capillary: 131 mg/dL — ABNORMAL HIGH (ref 70–99)
Glucose-Capillary: 131 mg/dL — ABNORMAL HIGH (ref 70–99)
Glucose-Capillary: 136 mg/dL — ABNORMAL HIGH (ref 70–99)
Glucose-Capillary: 159 mg/dL — ABNORMAL HIGH (ref 70–99)

## 2019-09-10 LAB — CBC
HCT: 37 % (ref 36.0–46.0)
Hemoglobin: 11.9 g/dL — ABNORMAL LOW (ref 12.0–15.0)
MCH: 27.8 pg (ref 26.0–34.0)
MCHC: 32.2 g/dL (ref 30.0–36.0)
MCV: 86.4 fL (ref 80.0–100.0)
Platelets: 470 10*3/uL — ABNORMAL HIGH (ref 150–400)
RBC: 4.28 MIL/uL (ref 3.87–5.11)
RDW: 16.5 % — ABNORMAL HIGH (ref 11.5–15.5)
WBC: 16.8 10*3/uL — ABNORMAL HIGH (ref 4.0–10.5)
nRBC: 0 % (ref 0.0–0.2)

## 2019-09-10 MED ORDER — INSULIN ASPART 100 UNIT/ML ~~LOC~~ SOLN: 0.0000 [IU] | Freq: Three times a day (TID) | SUBCUTANEOUS | Status: DC

## 2019-09-10 MED ORDER — NIMODIPINE 30 MG PO CAPS
60.0000 mg | ORAL_CAPSULE | ORAL | Status: DC
  Administered 2019-09-10 – 2019-09-19 (×23): 60 mg via ORAL
  Filled 2019-09-10 (×24): qty 2

## 2019-09-10 MED ORDER — POLYETHYLENE GLYCOL 3350 17 G PO PACK
17.0000 g | PACK | Freq: Every day | ORAL | Status: DC
  Administered 2019-09-10 – 2019-10-01 (×21): 17 g via ORAL
  Filled 2019-09-10 (×22): qty 1

## 2019-09-10 MED ORDER — HEPARIN SODIUM (PORCINE) 5000 UNIT/ML IJ SOLN: 5000.0000 [IU] | Freq: Three times a day (TID) | INTRAMUSCULAR | Status: DC

## 2019-09-10 MED ORDER — BETHANECHOL CHLORIDE 10 MG PO TABS
10.0000 mg | ORAL_TABLET | Freq: Three times a day (TID) | ORAL | Status: DC
  Administered 2019-09-10 – 2019-09-14 (×11): 10 mg via ORAL
  Filled 2019-09-10 (×11): qty 1

## 2019-09-10 MED ORDER — SORBITOL 70 % SOLN
30.0000 mL | Freq: Every day | Status: DC | PRN
  Administered 2019-09-26: 30 mL via ORAL
  Filled 2019-09-10: qty 30

## 2019-09-10 MED ORDER — NIMODIPINE 30 MG PO CAPS: 60.0000 mg | ORAL_CAPSULE | ORAL | Status: DC

## 2019-09-10 MED ORDER — HEPARIN SODIUM (PORCINE) 5000 UNIT/ML IJ SOLN
5000.0000 [IU] | Freq: Three times a day (TID) | INTRAMUSCULAR | Status: DC
  Administered 2019-09-10 – 2019-09-21 (×31): 5000 [IU] via SUBCUTANEOUS
  Filled 2019-09-10 (×32): qty 1

## 2019-09-10 MED ORDER — ASPIRIN 325 MG PO TABS: 325.0000 mg | ORAL_TABLET | Freq: Every day | ORAL | 3 refills | Status: DC

## 2019-09-10 MED ORDER — BETHANECHOL CHLORIDE 10 MG PO TABS
10.0000 mg | ORAL_TABLET | Freq: Three times a day (TID) | ORAL | Status: DC
  Administered 2019-09-10: 10 mg via ORAL
  Filled 2019-09-10: qty 1

## 2019-09-10 MED ORDER — INSULIN ASPART 100 UNIT/ML ~~LOC~~ SOLN
0.0000 [IU] | Freq: Three times a day (TID) | SUBCUTANEOUS | Status: DC
  Administered 2019-09-10 – 2019-09-16 (×2): 1 [IU] via SUBCUTANEOUS

## 2019-09-10 MED ORDER — ENSURE ENLIVE PO LIQD
237.0000 mL | Freq: Three times a day (TID) | ORAL | Status: DC
  Administered 2019-09-10 – 2019-09-15 (×10): 237 mL via ORAL

## 2019-09-10 MED ORDER — CLOPIDOGREL BISULFATE 75 MG PO TABS: 75.0000 mg | ORAL_TABLET | Freq: Every day | ORAL | 3 refills | Status: DC

## 2019-09-10 MED ORDER — ASPIRIN 325 MG PO TABS
325.0000 mg | ORAL_TABLET | Freq: Every day | ORAL | Status: DC
  Administered 2019-09-11 – 2019-10-01 (×21): 325 mg via ORAL
  Filled 2019-09-10 (×21): qty 1

## 2019-09-10 MED ORDER — NIMODIPINE 6 MG/ML PO SOLN
60.0000 mg | ORAL | Status: DC
  Administered 2019-09-11 – 2019-09-19 (×23): 60 mg via ORAL
  Filled 2019-09-10 (×53): qty 10

## 2019-09-10 MED ORDER — ACETAMINOPHEN 650 MG RE SUPP: 650.0000 mg | Freq: Four times a day (QID) | RECTAL | Status: DC | PRN

## 2019-09-10 MED ORDER — ACETAMINOPHEN 325 MG PO TABS
650.0000 mg | ORAL_TABLET | Freq: Four times a day (QID) | ORAL | Status: DC | PRN
  Administered 2019-09-11 – 2019-09-14 (×3): 650 mg via ORAL
  Filled 2019-09-10 (×3): qty 2

## 2019-09-10 MED ORDER — CLOPIDOGREL BISULFATE 75 MG PO TABS
75.0000 mg | ORAL_TABLET | Freq: Every day | ORAL | Status: DC
  Administered 2019-09-11 – 2019-10-01 (×21): 75 mg via ORAL
  Filled 2019-09-10 (×21): qty 1

## 2019-09-10 MED ORDER — OXYCODONE-ACETAMINOPHEN 5-325 MG PO TABS
1.0000 | ORAL_TABLET | ORAL | Status: DC | PRN
  Administered 2019-09-12 – 2019-09-13 (×2): 1 via ORAL
  Administered 2019-09-16: 2 via ORAL
  Filled 2019-09-10: qty 1
  Filled 2019-09-10: qty 2
  Filled 2019-09-10: qty 1

## 2019-09-10 NOTE — H&P (Addendum)
Physical Medicine and Rehabilitation Admission H&P     HPI: Haley Mayo is a 69 year old right-handed female with history of hypertension as well as hyperlipidemia and tobacco abuse.  Per chart review lives alone independent prior to admission.  1 level home 4 steps to entry.  She has 2 children 1 lives in [place] 1 lives in [place].Son is to provide assist on discharge.  Presented 08/26/2019 to [facility] after being found down for an extended time.  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  CT angiogram of the chest showed a filling defect within the proximal right upper lobe apical and posterior segmental arteries consistent with pulmonary embolus.  Moderate cardiomegaly.  There was also a 2.3 cm right thyroid heterogeneous nodule.  Venous Doppler studies lower extremity showed no DVT.  Admission chemistries showed a WBC of 27,100, hemoglobin 15.4, CK 3162, lactic acid 1.2, glucose 166, BUN 28, creatinine 0.81, troponin 23, SARS coronavirus negative.  Chest x-ray showed mild basilar atelectasis and/or infiltrate.  Placed on broad-spectrum antibiotics.  She was transferred to [facility] 08/30/2019 for ongoing follow-up and care.  Underwent diagnostic cerebral angiogram stent support coil embolization of basilar aneurysm 09/01/2019 per Dr. Kathyrn Sheriff.  EEG completed showing no seizure.  She underwent IVC filter placement 09/02/2019 per interventional radiology for pulmonary emboli.  She did remain on low-dose subcutaneous heparin for DVT prophylaxis.  Aspirin and Plavix later initiated after embolization.  Blood pressure controlled with Nimotop.  Her diet was slowly advanced to a dysphagia #3 thin  liquid diet with alternative means of nutrition for nutritional support.  There was some documentation on urinary retention placed on low-dose Urecholine.  Therapy evaluations completed and patient was admitted for a comprehensive rehab program.  Pt reports she feels uncomfortable- she thinks from foley. Has foley for acute urinary retention. Just has BM this afternoon. After lunch.  Ate a LOT for lunch, per pt.     Review of Systems  Constitutional: Negative for chills and fever.  HENT: Negative for hearing loss.   Eyes: Positive for double vision.  Respiratory: Positive for cough. Negative for shortness of breath.   Cardiovascular: Positive for leg swelling. Negative for chest pain and palpitations.  Gastrointestinal: Positive for constipation. Negative for heartburn, nausea and vomiting.  Genitourinary: Negative for dysuria, flank pain and hematuria.  Musculoskeletal: Positive for joint pain and myalgias.  Neurological: Positive for dizziness and headaches.  All other systems reviewed and are negative.  Past Medical History:  Diagnosis Date  . HLD (hyperlipidemia)   . HTN (hypertension)    controlled  . Leukocytosis    ?   Marland Kitchen Neoplasm of ear    left ear lobe  . Obesity   . Wears dentures    upper   Past Surgical History:  Procedure Laterality Date  . ABDOMINAL HYSTERECTOMY  Age 23  . COLONOSCOPY  09/2004   normal, rpt 10 yrs  . EAR CYST EXCISION Left 09/27/2017   Procedure: EXCISION EAR LOBE CYST;  Surgeon: Beverly Gust, MD;  Location: [facility];  Service: ENT;  Laterality: Left;  LOCAL ONLY  . IR ANGIO VERTEBRAL SEL VERTEBRAL UNI L MOD SED  09/01/2019  .  IR ANGIOGRAM FOLLOW UP STUDY  09/01/2019  . IR ANGIOGRAM FOLLOW UP STUDY  09/01/2019  . IR ANGIOGRAM FOLLOW UP STUDY  09/01/2019  . IR ANGIOGRAM FOLLOW UP STUDY  09/01/2019  . IR ANGIOGRAM FOLLOW UP STUDY  09/01/2019  . IR IVC FILTER PLMT / S&I /IMG GUID/MOD SED  09/02/2019  . IR NEURO EACH ADD'L AFTER BASIC UNI  LEFT (MS)  09/01/2019  . IR TRANSCATH/EMBOLIZ  09/01/2019  . OOPHORECTOMY    . RADIOLOGY WITH ANESTHESIA N/A 09/01/2019   Procedure: IR WITH ANESTHESIA;  Surgeon: Consuella Lose, MD;  Location: [facility];  Service: Radiology;  Laterality: N/A;  . TOTAL VAGINAL HYSTERECTOMY  05/2001   heavy bleeding, fibroids   Family History  Problem Relation Age of Onset  . Ulcers Father   . Pneumonia Mother   . Diabetes Sister   . Stroke Brother   . Lupus Sister   . Stroke Sister        hemorrhage after fall  . Lupus Sister   . Rheum arthritis Sister        ?  . Diabetes Sister   . Other Sister        peritoneal dialysis   Social History:  reports that she has been smoking cigarettes. She has a 40.00 pack-year smoking history. She uses smokeless tobacco. She reports that she does not drink alcohol or use drugs. Allergies: No Known Allergies Medications Prior to Admission  Medication Sig Dispense Refill  . acetaminophen (TYLENOL) 325 MG tablet Take 2 tablets (650 mg total) by mouth every 6 (six) hours as needed for mild pain, fever or headache.    Marland Kitchen amLODipine-benazepril (LOTREL) 10-20 MG capsule Take 1 capsule by mouth every evening.   3  . [START ON 09/11/2019] aspirin 325 MG tablet Take 1 tablet (325 mg total) by mouth daily. 30 tablet 3  . [START ON 09/11/2019] clopidogrel (PLAVIX) 75 MG tablet Take 1 tablet (75 mg total) by mouth daily. 30 tablet 3  . Coenzyme Q10-Omega 3 Fatty Acd (HEALTHY HEART) EMUL Take 1 capsule by mouth daily.      . Multiple Vitamins-Minerals (EMERGEN-C VITAMIN C PO) Take 1 tablet by mouth daily.     Marland Kitchen niMODipine (NIMOTOP) 30 MG capsule Take 2 capsules (60 mg total) by mouth every 4 (four) hours for 21 days.      Drug Regimen Review Drug regimen was reviewed and remains appropriate with no significant issues identified.  Home:     Functional History:    Functional Status:  Mobility:          ADL:    Cognition: Cognition Orientation Level: Oriented to  person, Oriented to place, Oriented to situation, Disoriented to time    Physical Exam: There were no vitals taken for this visit. Physical Exam  Nursing note and vitals reviewed. Constitutional: She is oriented to person, place, and time. She appears well-developed and well-nourished.  Older female sitting up in recliner, vague, but able to answer questions well, c/o discomfort from foley?, NAD  HENT:  Head: Normocephalic.  Nose: Nose normal.  Mouth/Throat: Oropharynx is clear and moist. No oropharyngeal exudate.  S/p coiling Smile equal- no upper teeth Tongue midline- slightly coated- not bad  Eyes: Conjunctivae are normal.  EOMI but slow B/L No nystagmus  Neck: No tracheal deviation present.  Cardiovascular:  RRR- no JVD  Respiratory: No stridor.  CTA B/L- no W/R/R- good air movement   GI:  Soft, NT, ND, (+)BS  Genitourinary:    Genitourinary Comments: Foley in place- doesn't appear out of place on exam   Musculoskeletal:     Cervical back: Normal range of motion and neck supple.     Comments: RUE 4+/5 in deltoid, biceps, triceps, WE< grip and finger abd LUE 4+/5 in same muscles RUE- HF 2+/5, KE/KF 3+/5- painful; DF/PF 4+/5 LLE- HF 3+/5, KE/KF 4-/5, DF and PF 4+/5  Neurological: She is oriented to person, place, and time.  Patient is alert in no acute distress.  She does make eye contact with examiner.  Provides her name and age but needed multiple cues for place and appropriate year.  Follows simple commands. Able to know location, which hospital and why here- knew was 5/26 or 5/27!; and knew her son was coming from [place] to help her Intact to light touch in all 4 extremities  Skin: Skin is warm and dry.  Stippling of lower legs R ankle >L ankle Sacral decub- superficial sacrum stage II  Psychiatric: She has a normal mood and affect.    Results for orders placed or performed during the hospital encounter of 09/10/19 (from the past 48 hour(s))  Glucose, capillary      Status: Abnormal   Collection Time: 09/10/19  5:15 PM  Result Value Ref Range   Glucose-Capillary 131 (H) 70 - 99 mg/dL    Comment: Glucose reference range applies only to samples taken after fasting for at least 8 hours.   VAS [place] TRANSCRANIAL DOPPLER  Result Date: 09/09/2019  Transcranial Doppler Indications: Subarachnoid hemorrhage. Performing Technologist: June Leap RDMS, RVT  Examination Guidelines: A complete evaluation includes B-mode imaging, spectral Doppler, color Doppler, and power Doppler as needed of all accessible portions of each vessel. Bilateral testing is considered an integral part of a complete examination. Limited examinations for reoccurring indications may be performed as noted.  +----------+-------------+----------+-----------+-------+ RIGHT TCD Right VM (cm)Depth (cm)PulsatilityComment +----------+-------------+----------+-----------+-------+ Opthalmic     27.00                 1.76            +----------+-------------+----------+-----------+-------+ ICA siphon    41.00                 1.56            +----------+-------------+----------+-----------+-------+  +----------+------------+----------+-----------+-------+ LEFT TCD  Left VM (cm)Depth (cm)PulsatilityComment +----------+------------+----------+-----------+-------+ Opthalmic    26.00                 1.83            +----------+------------+----------+-----------+-------+ ICA siphon   36.00                 1.23            +----------+------------+----------+-----------+-------+  Summary:  Suboptimal study due to poor bitemporal and occipital windows bilaterally. Antegrade flow in both opthalmic and carotid siphons *See table(s) above for TCD measurements and observations.  Diagnosing physician: Antony Contras MD Electronically signed by Antony Contras MD on 09/09/2019 at 2:48:00 PM.    Final     Medical Problem List and Plan: 1.  Decreased functional mobility secondary to subarachnoid  hemorrhage right>left status with basilar aneurysm.  Status post embolization 09/01/2019  -patient may shower  -ELOS/Goals: 2-2.5 weeks- Supervision to Mod I 2.  Antithrombotics: -DVT/anticoagulation: Pulmonary emboli.  Status post IVC filter 09/02/2019.  Lower extremity Dopplers negative.  Continue low-dose heparin  -antiplatelet therapy: Aspirin 325 mg and Plavix 75 mg daily 3. Pain  Management: Tylenol as needed 4. Mood: Provide emotional support  -antipsychotic agents: N/A 5. Neuropsych: This patient is capable of making decisions on her own behalf. 6. Skin/Wound Care: Routine skin checks 7. Fluids/Electrolytes/Nutrition: Routine INO's with follow-up chemistries 8.  Blood pressure.  Continue Nimotop as directed 9.  2.3 cm right thyroid heterogeneous nodule.  Follow-up outpatient 10.  Tobacco abuse.  Counseling 11.  Dysphagia. Dysphagia #3 thin liquids. Follow-up speech therapy 12.  Urinary retention.  Continue low-dose Urecholine.  Check PVR x3- might benefit from flomax- has foley in currently 13. Stage II sacral decub- POA- likes to sit in 1 position- will need foam dressing and barrier cream for now and get her off sacrum  Lavon Paganini. Mead, PA-C 09/10/2019   I have personally performed a face to face diagnostic evaluation of this patient and formulated the key components of the plan.  Additionally, I have personally reviewed laboratory data, imaging studies, as well as relevant notes and concur with the physician assistant's documentation above.   The patient's status has not changed from the original H&P.  Any changes in documentation from the acute care chart have been noted above.    Courtney Heys, MD 09/10/2019

## 2019-09-10 NOTE — PMR Pre-admission (Signed)
PMR Admission Coordinator Pre-Admission Assessment  Patient: Haley Mayo is an 69 y.o., female MRN: [id number] DOB: December 26, 1950 Height:   Weight: 73.2 kg              Insurance Information HMO:     PPO:      PCP:      IPA:      80/20:      OTHER:  PRIMARY: Medicare A and B      Policy#: [id number]      Subscriber: Haley Mayo CM Name:       Phone#:      Fax#:  Pre-Cert#: verified online      Employer:  Benefits:  Phone #:      Name:  Eff. Date: A and B 02/14/2017     Deduct: $1484      Out of Pocket Max: n/a      Life Max: n/a  CIR: 100%      SNF: 20 full days Outpatient: 80%     Co-Pay: 20% Home Health: 100%      Co-Pay:  DME: 80%     Co-Pay: 20% Providers: Haley Mayo choice SECONDARY:       Policy#:       Phone#:   Development worker, community:       Phone#:   The Therapist, art Information Summary" for patients in Inpatient Rehabilitation Facilities with attached "Privacy Act [place] Records" was provided and verbally reviewed with: Patient and Family  Emergency Contact Information Contact Information    Name Relation Home Work Mobile   Tom,Kristopher Son [phone]  [phone]     Current Medical History  Patient Admitting Diagnosis: SAH, aneurysm s/p coiling  History of Present Illness: AMAJAH Mayo is a 69 year old right-handed female with history of hypertension as well as hyperlipidemia and tobacco abuse.  [facility] after being found down for an extended time (? 10 days).  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  CT angiogram of the chest showed a filling defect within the proximal right upper lobe apical and posterior segmental arteries  consistent with pulmonary embolus.  Moderate cardiomegaly.  There was also a 2.3 cm right thyroid heterogeneous nodule.  Venous Doppler studies lower extremity showed no DVT.  Admission chemistries showed a WBC of 27,100, hemoglobin 15.4, CK 3162, lactic acid 1.2, glucose 166, BUN 28, creatinine 0.81, troponin 23, SARS coronavirus negative.  Chest x-ray showed mild basilar atelectasis and/or infiltrate.  Placed on broad-spectrum antibiotics.  She was transferred to [facility] 08/30/2019 for ongoing follow-up and care.  Underwent diagnostic cerebral angiogram stent support coil embolization of basilar aneurysm 09/01/2019 per Dr. Kathyrn Sheriff.  EEG completed showing no seizure.  She underwent IVC filter placement 09/02/2019 per interventional radiology for pulmonary emboli.  She did remain on low-dose subcutaneous heparin for DVT prophylaxis.  Aspirin and Plavix later initiated after embolization.  Blood pressure controlled with Nimotop.  Her diet was slowly advanced to a dysphagia #3 thin liquid diet.  There was some documentation on urinary retention placed on low-dose Urecholine.  Therapy evaluations completed and patient was recommended for a comprehensive rehab program.   Past Medical History  Past Medical History:  Diagnosis  Date  . HLD (hyperlipidemia)   . HTN (hypertension)    controlled  . Leukocytosis    ?   Marland Kitchen Neoplasm of ear    left ear lobe  . Obesity   . Wears dentures    upper    Family History  family history includes Diabetes in her sister and sister; Lupus in her sister and sister; Other in her sister; Pneumonia in her mother; Rheum arthritis in her sister; Stroke in her brother and sister; Ulcers in her father.  Prior Rehab/Hospitalizations:  Has the patient had prior rehab or hospitalizations prior to admission? No  Has the patient had major surgery during 100 days prior to admission? Yes  Current Medications   Current Facility-Administered Medications:  .  0.9 %   sodium chloride infusion, 250 mL, Intravenous, Continuous, Agarwala, Ravi, MD .  0.9 %  sodium chloride infusion, , Intravenous, Continuous, Agarwala, Ravi, MD, Last Rate: 75 mL/hr at 09/09/19 1930, New Bag at 09/09/19 1930 .  acetaminophen (TYLENOL) tablet 650 mg, 650 mg, Oral, Q6H PRN, 650 mg at 09/08/19 0758 **OR** acetaminophen (TYLENOL) suppository 650 mg, 650 mg, Rectal, Q6H PRN, Agarwala, Ravi, MD .  aspirin tablet 325 mg, 325 mg, Oral, Daily, Agarwala, Ravi, MD, 325 mg at 09/10/19 0930 .  bethanechol (URECHOLINE) tablet 10 mg, 10 mg, Oral, TID, Agarwala, Ravi, MD, 10 mg at 09/10/19 0930 .  chlorhexidine (PERIDEX) 0.12 % solution 15 mL, 15 mL, Mouth Rinse, BID, Agarwala, Ravi, MD, 15 mL at 09/10/19 0931 .  Chlorhexidine Gluconate Cloth 2 % PADS 6 each, 6 each, Topical, Daily, Kipp Brood, MD, 6 each at 09/09/19 2100 .  clopidogrel (PLAVIX) tablet 75 mg, 75 mg, Oral, Daily, Agarwala, Ravi, MD, 75 mg at 09/10/19 0930 .  feeding supplement (ENSURE ENLIVE) (ENSURE ENLIVE) liquid 237 mL, 237 mL, Oral, TID BM, Agarwala, Ravi, MD, 237 mL at 09/09/19 2051 .  heparin injection 5,000 Units, 5,000 Units, Subcutaneous, Q8H, Agarwala, Ravi, MD, 5,000 Units at 09/10/19 0548 .  insulin aspart (novoLOG) injection 0-9 Units, 0-9 Units, Subcutaneous, Q4H, Agarwala, Ravi, MD, 1 Units at 09/10/19 0401 .  labetalol (NORMODYNE) injection 5-20 mg, 5-20 mg, Intravenous, Q10 min PRN, Kipp Brood, MD, 5 mg at 09/06/19 0307 .  MEDLINE mouth rinse, 15 mL, Mouth Rinse, q12n4p, Agarwala, Ravi, MD, 15 mL at 09/09/19 1600 .  niMODipine (NIMOTOP) capsule 60 mg, 60 mg, Oral, Q4H, 60 mg at 09/10/19 0929 **OR** niMODipine (NYMALIZE) 6 MG/ML oral solution 60 mg, 60 mg, Oral, Q4H, Agarwala, Ravi, MD .  oxyCODONE-acetaminophen (PERCOCET/ROXICET) 5-325 MG per tablet 1-2 tablet, 1-2 tablet, Oral, Q4H PRN, Agarwala, Ravi, MD .  polyethylene glycol (MIRALAX / GLYCOLAX) packet 17 g, 17 g, Oral, Daily, Agarwala, Ravi, MD, 17 g at  09/08/19 [id number]  Patients Current Diet:  Diet Order            DIET DYS 3 Room service appropriate? Yes with Assist; Fluid consistency: Thin  Diet effective now              Precautions / Restrictions Precautions Precautions: Fall Precaution Comments: R visual field diplopia on eval - Haley Mayo reports no diplopia today and without taped glasses  Restrictions Weight Bearing Restrictions: No   Has the patient had 2 or more falls or a fall with injury in the past year?Yes  Prior Activity Level Community (5-7x/wk): recently retired from [employer] in [place], no DME prior to admission, not driving  Prior Functional Level Prior Function Level of Independence:  Independent Comments: Haley Mayo does not drive, uses [facility] transportation for groceries/doctors appointments. Otherwise indep for all ADL/IADL management. Retired from [place]: Did the patient need help bathing, dressing, using the toilet or eating?  Independent  Indoor Mobility: Did the patient need assistance with walking from room to room (with or without device)? Independent  Stairs: Did the patient need assistance with internal or external stairs (with or without device)? Independent  Functional Cognition: Did the patient need help planning regular tasks such as shopping or remembering to take medications? Independent  Home Assistive Devices / Equipment Home Equipment: Grab bars - tub/shower  Prior Device Use: Indicate devices/aids used by the patient prior to current illness, exacerbation or injury? None of the above  Current Functional Level Cognition  Arousal/Alertness: (drowsy) Overall Cognitive Status: Impaired/Different from baseline Current Attention Level: Sustained Orientation Level: Oriented X4 Following Commands: Follows one step commands with increased time Safety/Judgement: Decreased awareness of safety, Decreased awareness of deficits General Comments: tangential conversation. Haley Mayo quick to sit  despite not having full awareness that chair is behind her   Attention: Selective Selective Attention: Impaired Selective Attention Impairment: Verbal basic Memory: Impaired Memory Impairment: Storage deficit Awareness: Impaired Awareness Impairment: Intellectual impairment Comments: delayed processing    Extremity Assessment (includes Sensation/Coordination)  Upper Extremity Assessment: Generalized weakness RUE Deficits / Details: limited by Aline in wrist  RUE Coordination: decreased fine motor LUE Deficits / Details: generalized weakness; using functionally; mild incoordination LUE Coordination: decreased fine motor  Lower Extremity Assessment: Defer to Haley Mayo evaluation RLE Deficits / Details: 3+5 knee extension and hip extension during functional activity, 4-/5 PF/DF bilaterally RLE Sensation: WNL RLE Coordination: (dysmetria, slow and deliberate finger to nose) LLE Deficits / Details: 3+5 knee extension and hip extension during functional activity, 4-/5 PF/DF bilaterally LLE Sensation: WNL LLE Coordination: (dysmetria, slow and deliberate finger to nose)    ADLs  Overall ADL's : Needs assistance/impaired Eating/Feeding: Minimal assistance, Sitting Eating/Feeding Details (indicate cue type and reason): Haley Mayo eating breakfast at arrival, taking bites of her icecream (magic cup during session completion), Haley Mayo requiring assist to open cap of water bottle end of session while seated in recliner  Grooming: Oral care, Moderate assistance, Standing, Sitting Grooming Details (indicate cue type and reason): Haley Mayo requiring encouragement to attempt performing in standing at sink (Haley Mayo fatigued from earlier mobility tasks in room), stood for initial part of task (applying toothpaste to toothbrush) but Haley Mayo sitting to perform rest of task; close minguard-minA as Haley Mayo sitting at edge of recliner and fluctating levels of sitting balance. requires mod cues for sequencing/initiating parts of task  Upper Body  Bathing: Moderate assistance, Sitting Lower Body Bathing: Maximal assistance, Sit to/from stand Upper Body Dressing : Maximal assistance, Sitting Lower Body Dressing: Maximal assistance, +2 for physical assistance, Sit to/from stand, Sitting/lateral leans Lower Body Dressing Details (indicate cue type and reason): attempted to have Haley Mayo don her own socks but Haley Mayo unable  Toilet Transfer: +2 for physical assistance, Maximal assistance Toilet Transfer Details (indicate cue type and reason): stand pivot to chair and unaware on incontinence of bowel Toileting- Clothing Manipulation and Hygiene: Total assistance Toileting - Clothing Manipulation Details (indicate cue type and reason): Haley Mayo currently with flexi/foley Functional mobility during ADLs: Moderate assistance, +2 for physical assistance, +2 for safety/equipment, Rolling walker General ADL Comments: Haley Mayo continues to present with weakness, impaired cognition, poor sitting/standing balance and mobility status     Mobility  Overal bed mobility: Needs Assistance Bed Mobility: Supine to Sit  Rolling: Mod assist Sidelying to sit: Mod assist Supine to sit: Mod assist General bed mobility comments: significant time and cues to initiate and motor plan task. Haley Mayo stopping in sidelying and does not initiate further until tactile cues provided    Transfers  Overall transfer level: Needs assistance Equipment used: Rolling walker (2 wheeled) Transfers: Sit to/from Stand Sit to Stand: Mod assist, +2 physical assistance Stand pivot transfers: Mod assist, +2 physical assistance Squat pivot transfers: Max assist, +2 physical assistance General transfer comment: Haley Mayo requiring max directional and verbal cues to complete std pvt transfer, maxA posterior hip tactile cues/support required to maintain upright posture, Haley Mayo with minimal foot clearance, dependent on RW and physical assist    Ambulation / Gait / Stairs / Wheelchair Mobility   Ambulation/Gait Ambulation/Gait assistance: Mod assist, +2 physical assistance Gait Distance (Feet): 20 Feet Assistive device: Rolling walker (2 wheeled) Gait Pattern/deviations: Shuffle, Step-to pattern, Trunk flexed General Gait Details: Haley Mayo with short shuffling steps, increased trunk flexion, unable to improve foot clearance despite Haley Mayo cues. Haley Mayo also unable to maintain RW close to BOS despite frequent verbal and tactile cues Gait velocity: reduced Gait velocity interpretation: <1.8 ft/sec, indicate of risk for recurrent falls    Posture / Balance Dynamic Sitting Balance Sitting balance - Comments: min-modA R lateral lean Balance Overall balance assessment: Needs assistance Sitting-balance support: No upper extremity supported, Feet supported Sitting balance-Leahy Scale: Poor Sitting balance - Comments: min-modA R lateral lean Postural control: Posterior lean, Right lateral lean Standing balance support: Bilateral upper extremity supported Standing balance-Leahy Scale: Poor Standing balance comment: minA-modA for static standing balance with BUE support of RW    Special needs/care consideration Skin ecchymosis, MASD to buttocks, skin tear to L buttocks, Diabetic management yes and Designated visitor Durenda Guthrie (son)     Previous Home Environment (from acute therapy documentation) Living Arrangements: Alone Available Help at Discharge: Other (Comment) Type of Home: House Home Layout: One level Home Access: Stairs to enter Entrance Stairs-Rails: Can reach both Entrance Stairs-Number of Steps: 4 Bathroom Shower/Tub: Chiropodist: Standard Additional Comments: Haley Mayo reports she lives alone, and has limited support at discharge   Discharge Living Setting Plans for Discharge Living Setting: Other (Comment)(son to stay with her) Type of Home at Discharge: House Discharge Home Layout: One level Discharge Home Access: Stairs to enter Entrance Stairs-Rails: Can  reach both Entrance Stairs-Number of Steps: 3+1 Discharge Bathroom Shower/Tub: Tub/shower unit Discharge Bathroom Toilet: Standard Discharge Bathroom Accessibility: Yes How Accessible: Accessible via walker Does the patient have any problems obtaining your medications?: No  Social/Family/Support Systems Anticipated Caregiver: son, Durenda Guthrie Anticipated Ambulance person Information: [phone] Ability/Limitations of Caregiver: min assist Caregiver Availability: 24/7 Discharge Plan Discussed with Primary Caregiver: Yes Is Caregiver In Agreement with Plan?: Yes Does Caregiver/Family have Issues with Lodging/Transportation while Haley Mayo is in Rehab?: No   Goals Patient/Family Goal for Rehab: Haley Mayo/OT/SLP supervision to min asisst Expected length of stay: 21-24 days Additional Information: Haley Mayo's son lives in [place], will come stay with Haley Mayo when she's ready to d/c (he's anticipating that will be in about 4-6 weeks) Haley Mayo/Family Agrees to Admission and willing to participate: Yes Program Orientation Provided & Reviewed with Haley Mayo/Caregiver Including Roles  & Responsibilities: Yes   Decrease burden of Care through IP rehab admission: n/a  Possible need for SNF placement upon discharge: possibly, depending on Haley Mayo's progress with AIR   Patient Condition: This patient's medical and functional status has changed since the consult dated: 09/03/2019 in which the  Rehabilitation Physician determined and documented that the patient's condition is appropriate for intensive rehabilitative care in an inpatient rehabilitation facility. See "History of Present Illness" (above) for medical update. Functional changes are: Haley Mayo is mod assist +2 x20' for mobility, and min to mod assist for ADLs. Patient's medical and functional status update has been discussed with the Rehabilitation physician and patient remains appropriate for inpatient rehabilitation. Will admit to inpatient rehab today.  Preadmission Screen  Completed By:  Michel Santee, Haley Mayo, DPT 09/10/2019 9:56 AM ______________________________________________________________________   Discussed status with Dr. Dagoberto Ligas on 09/10/19 at 10:03 AM and received approval for admission today.  Admission Coordinator:  Michel Santee,  Haley Mayo, DPT time 10:03 AM Sudie Grumbling 09/10/19

## 2019-09-10 NOTE — Progress Notes (Signed)
PMR Admission Coordinator Pre-Admission Assessment   Patient: Haley Mayo is an 69 y.o., female MRN: [id number] DOB: 05-16-1950 Height:   Weight: 73.2 kg                                                                                                                                                  Insurance Information HMO:     PPO:      PCP:      IPA:      80/20:      OTHER:  PRIMARY: Medicare A and B      Policy#: [id number]      Subscriber: pt CM Name:       Phone#:      Fax#:  Pre-Cert#: verified online      Employer:  Benefits:  Phone #:      Name:  Eff. Date: A and B 02/14/2017     Deduct: $1484      Out of Pocket Max: n/a      Life Max: n/a  CIR: 100%      SNF: 20 full days Outpatient: 80%     Co-Pay: 20% Home Health: 100%      Co-Pay:  DME: 80%     Co-Pay: 20% Providers: pt choice SECONDARY:       Policy#:       Phone#:    Development worker, community:       Phone#:    The Therapist, art Information Summary" for patients in Inpatient Rehabilitation Facilities with attached "Privacy Act [place] Records" was provided and verbally reviewed with: Patient and Family   Emergency Contact Information Contact Information     Name Relation Home Work Mobile    Tom,Kristopher Son [phone]   [phone]       Current Medical History  Patient Admitting Diagnosis: SAH, aneurysm s/p coiling   History of Present Illness: Haley Mayo is a 69 year old right-handed female with history of hypertension as well as hyperlipidemia and tobacco abuse.  Presented 08/26/2019 to [facility] after being found down for an extended time (? 10 days).  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  CTA head and neck showed a 9 x 7 mm aneurysm of the tip of the basilar arter as well as right upper lobar pulmonary artery  embolus.  CT angiogram of the chest showed a filling defect within the proximal right upper lobe apical and posterior segmental arteries consistent with pulmonary embolus.  Moderate cardiomegaly.  There was also a 2.3 cm right thyroid heterogeneous nodule.  Venous Doppler studies lower extremity showed no DVT.  Admission chemistries showed a WBC of 27,100, hemoglobin 15.4, CK 3162, lactic acid 1.2, glucose 166, BUN 28, creatinine 0.81,  troponin 23, SARS coronavirus negative.  Chest x-ray showed mild basilar atelectasis and/or infiltrate.  Placed on broad-spectrum antibiotics.  She was transferred to [facility] 08/30/2019 for ongoing follow-up and care.  Underwent diagnostic cerebral angiogram stent support coil embolization of basilar aneurysm 09/01/2019 per Dr. Kathyrn Sheriff.  EEG completed showing no seizure.  She underwent IVC filter placement 09/02/2019 per interventional radiology for pulmonary emboli.  She did remain on low-dose subcutaneous heparin for DVT prophylaxis.  Aspirin and Plavix later initiated after embolization.  Blood pressure controlled with Nimotop.  Her diet was slowly advanced to a dysphagia #3 thin liquid diet.  There was some documentation on urinary retention placed on low-dose Urecholine.  Therapy evaluations completed and patient was recommended for a comprehensive rehab program.    Past Medical History      Past Medical History:  Diagnosis Date  . HLD (hyperlipidemia)    . HTN (hypertension)      controlled  . Leukocytosis      ?   Marland Kitchen Neoplasm of ear      left ear lobe  . Obesity    . Wears dentures      upper      Family History  family history includes Diabetes in her sister and sister; Lupus in her sister and sister; Other in her sister; Pneumonia in her mother; Rheum arthritis in her sister; Stroke in her brother and sister; Ulcers in her father.   Prior Rehab/Hospitalizations:  Has the patient had prior rehab or hospitalizations prior to admission? No   Has  the patient had major surgery during 100 days prior to admission? Yes   Current Medications    Current Facility-Administered Medications:  .  0.9 %  sodium chloride infusion, 250 mL, Intravenous, Continuous, Agarwala, Ravi, MD .  0.9 %  sodium chloride infusion, , Intravenous, Continuous, Agarwala, Ravi, MD, Last Rate: 75 mL/hr at 09/09/19 1930, New Bag at 09/09/19 1930 .  acetaminophen (TYLENOL) tablet 650 mg, 650 mg, Oral, Q6H PRN, 650 mg at 09/08/19 0758 **OR** acetaminophen (TYLENOL) suppository 650 mg, 650 mg, Rectal, Q6H PRN, Agarwala, Ravi, MD .  aspirin tablet 325 mg, 325 mg, Oral, Daily, Agarwala, Ravi, MD, 325 mg at 09/10/19 0930 .  bethanechol (URECHOLINE) tablet 10 mg, 10 mg, Oral, TID, Agarwala, Ravi, MD, 10 mg at 09/10/19 0930 .  chlorhexidine (PERIDEX) 0.12 % solution 15 mL, 15 mL, Mouth Rinse, BID, Agarwala, Ravi, MD, 15 mL at 09/10/19 0931 .  Chlorhexidine Gluconate Cloth 2 % PADS 6 each, 6 each, Topical, Daily, Kipp Brood, MD, 6 each at 09/09/19 2100 .  clopidogrel (PLAVIX) tablet 75 mg, 75 mg, Oral, Daily, Agarwala, Ravi, MD, 75 mg at 09/10/19 0930 .  feeding supplement (ENSURE ENLIVE) (ENSURE ENLIVE) liquid 237 mL, 237 mL, Oral, TID BM, Agarwala, Ravi, MD, 237 mL at 09/09/19 2051 .  heparin injection 5,000 Units, 5,000 Units, Subcutaneous, Q8H, Agarwala, Ravi, MD, 5,000 Units at 09/10/19 0548 .  insulin aspart (novoLOG) injection 0-9 Units, 0-9 Units, Subcutaneous, Q4H, Agarwala, Ravi, MD, 1 Units at 09/10/19 0401 .  labetalol (NORMODYNE) injection 5-20 mg, 5-20 mg, Intravenous, Q10 min PRN, Kipp Brood, MD, 5 mg at 09/06/19 0307 .  MEDLINE mouth rinse, 15 mL, Mouth Rinse, q12n4p, Agarwala, Ravi, MD, 15 mL at 09/09/19 1600 .  niMODipine (NIMOTOP) capsule 60 mg, 60 mg, Oral, Q4H, 60 mg at 09/10/19 0929 **OR** niMODipine (NYMALIZE) 6 MG/ML oral solution 60 mg, 60 mg, Oral, Q4H, Agarwala, Ravi, MD .  oxyCODONE-acetaminophen (  PERCOCET/ROXICET) 5-325 MG per tablet 1-2 tablet,  1-2 tablet, Oral, Q4H PRN, Agarwala, Ravi, MD .  polyethylene glycol (MIRALAX / GLYCOLAX) packet 17 g, 17 g, Oral, Daily, Agarwala, Ravi, MD, 17 g at 09/08/19 [id number]   Patients Current Diet:     Diet Order                      DIET DYS 3 Room service appropriate? Yes with Assist; Fluid consistency: Thin  Diet effective now                   Precautions / Restrictions Precautions Precautions: Fall Precaution Comments: R visual field diplopia on eval - pt reports no diplopia today and without taped glasses  Restrictions Weight Bearing Restrictions: No    Has the patient had 2 or more falls or a fall with injury in the past year?Yes   Prior Activity Level Community (5-7x/wk): recently retired from [employer] in [place], no DME prior to admission, not driving   Prior Functional Level Prior Function Level of Independence: Independent Comments: pt does not drive, uses [facility] transportation for groceries/doctors appointments. Otherwise indep for all ADL/IADL management. Retired from [employer]: Did the patient need help bathing, dressing, using the toilet or eating?  Independent   Indoor Mobility: Did the patient need assistance with walking from room to room (with or without device)? Independent   Stairs: Did the patient need assistance with internal or external stairs (with or without device)? Independent   Functional Cognition: Did the patient need help planning regular tasks such as shopping or remembering to take medications? Independent   Home Assistive Devices / Equipment Home Equipment: Grab bars - tub/shower   Prior Device Use: Indicate devices/aids used by the patient prior to current illness, exacerbation or injury? None of the above   Current Functional Level Cognition   Arousal/Alertness: (drowsy) Overall Cognitive Status: Impaired/Different from baseline Current Attention Level: Sustained Orientation Level: Oriented X4 Following Commands: Follows one  step commands with increased time Safety/Judgement: Decreased awareness of safety, Decreased awareness of deficits General Comments: tangential conversation. pt quick to sit despite not having full awareness that chair is behind her   Attention: Selective Selective Attention: Impaired Selective Attention Impairment: Verbal basic Memory: Impaired Memory Impairment: Storage deficit Awareness: Impaired Awareness Impairment: Intellectual impairment Comments: delayed processing    Extremity Assessment (includes Sensation/Coordination)   Upper Extremity Assessment: Generalized weakness RUE Deficits / Details: limited by Aline in wrist  RUE Coordination: decreased fine motor LUE Deficits / Details: generalized weakness; using functionally; mild incoordination LUE Coordination: decreased fine motor  Lower Extremity Assessment: Defer to PT evaluation RLE Deficits / Details: 3+5 knee extension and hip extension during functional activity, 4-/5 PF/DF bilaterally RLE Sensation: WNL RLE Coordination: (dysmetria, slow and deliberate finger to nose) LLE Deficits / Details: 3+5 knee extension and hip extension during functional activity, 4-/5 PF/DF bilaterally LLE Sensation: WNL LLE Coordination: (dysmetria, slow and deliberate finger to nose)     ADLs   Overall ADL's : Needs assistance/impaired Eating/Feeding: Minimal assistance, Sitting Eating/Feeding Details (indicate cue type and reason): pt eating breakfast at arrival, taking bites of her icecream (magic cup during session completion), pt requiring assist to open cap of water bottle end of session while seated in recliner  Grooming: Oral care, Moderate assistance, Standing, Sitting Grooming Details (indicate cue type and reason): pt requiring encouragement to attempt performing in standing at sink (pt fatigued from earlier mobility tasks  in room), stood for initial part of task (applying toothpaste to toothbrush) but pt sitting to perform rest  of task; close minguard-minA as pt sitting at edge of recliner and fluctating levels of sitting balance. requires mod cues for sequencing/initiating parts of task  Upper Body Bathing: Moderate assistance, Sitting Lower Body Bathing: Maximal assistance, Sit to/from stand Upper Body Dressing : Maximal assistance, Sitting Lower Body Dressing: Maximal assistance, +2 for physical assistance, Sit to/from stand, Sitting/lateral leans Lower Body Dressing Details (indicate cue type and reason): attempted to have pt don her own socks but pt unable  Toilet Transfer: +2 for physical assistance, Maximal assistance Toilet Transfer Details (indicate cue type and reason): stand pivot to chair and unaware on incontinence of bowel Toileting- Clothing Manipulation and Hygiene: Total assistance Toileting - Clothing Manipulation Details (indicate cue type and reason): pt currently with flexi/foley Functional mobility during ADLs: Moderate assistance, +2 for physical assistance, +2 for safety/equipment, Rolling walker General ADL Comments: pt continues to present with weakness, impaired cognition, poor sitting/standing balance and mobility status      Mobility   Overal bed mobility: Needs Assistance Bed Mobility: Supine to Sit Rolling: Mod assist Sidelying to sit: Mod assist Supine to sit: Mod assist General bed mobility comments: significant time and cues to initiate and motor plan task. pt stopping in sidelying and does not initiate further until tactile cues provided     Transfers   Overall transfer level: Needs assistance Equipment used: Rolling walker (2 wheeled) Transfers: Sit to/from Stand Sit to Stand: Mod assist, +2 physical assistance Stand pivot transfers: Mod assist, +2 physical assistance Squat pivot transfers: Max assist, +2 physical assistance General transfer comment: pt requiring max directional and verbal cues to complete std pvt transfer, maxA posterior hip tactile cues/support required to  maintain upright posture, pt with minimal foot clearance, dependent on RW and physical assist     Ambulation / Gait / Stairs / Wheelchair Mobility   Ambulation/Gait Ambulation/Gait assistance: Mod assist, +2 physical assistance Gait Distance (Feet): 20 Feet Assistive device: Rolling walker (2 wheeled) Gait Pattern/deviations: Shuffle, Step-to pattern, Trunk flexed General Gait Details: pt with short shuffling steps, increased trunk flexion, unable to improve foot clearance despite PT cues. Pt also unable to maintain RW close to BOS despite frequent verbal and tactile cues Gait velocity: reduced Gait velocity interpretation: <1.8 ft/sec, indicate of risk for recurrent falls     Posture / Balance Dynamic Sitting Balance Sitting balance - Comments: min-modA R lateral lean Balance Overall balance assessment: Needs assistance Sitting-balance support: No upper extremity supported, Feet supported Sitting balance-Leahy Scale: Poor Sitting balance - Comments: min-modA R lateral lean Postural control: Posterior lean, Right lateral lean Standing balance support: Bilateral upper extremity supported Standing balance-Leahy Scale: Poor Standing balance comment: minA-modA for static standing balance with BUE support of RW     Special needs/care consideration Skin ecchymosis, MASD to buttocks, skin tear to L buttocks, Diabetic management yes and Designated visitor Haley Mayo (son)        Previous Home Environment (from acute therapy documentation) Living Arrangements: Alone Available Help at Discharge: Other (Comment) Type of Home: House Home Layout: One level Home Access: Stairs to enter Entrance Stairs-Rails: Can reach both Entrance Stairs-Number of Steps: 4 Bathroom Shower/Tub: Chiropodist: Standard Additional Comments: Pt reports she lives alone, and has limited support at discharge    Discharge Living Setting Plans for Discharge Living Setting: Other (Comment)(son  to stay with her) Type of Home at Discharge: [facility]  Discharge Home Layout: One level Discharge Home Access: Stairs to enter Entrance Stairs-Rails: Can reach both Entrance Stairs-Number of Steps: 3+1 Discharge Bathroom Shower/Tub: Tub/shower unit Discharge Bathroom Toilet: Standard Discharge Bathroom Accessibility: Yes How Accessible: Accessible via walker Does the patient have any problems obtaining your medications?: No   Social/Family/Support Systems Anticipated Caregiver: son, Haley Mayo Anticipated Ambulance person Information: [phone] Ability/Limitations of Caregiver: min assist Caregiver Availability: 24/7 Discharge Plan Discussed with Primary Caregiver: Yes Is Caregiver In Agreement with Plan?: Yes Does Caregiver/Family have Issues with Lodging/Transportation while Pt is in Rehab?: No     Goals Patient/Family Goal for Rehab: PT/OT/SLP supervision to min asisst Expected length of stay: 21-24 days Additional Information: Pt's son lives in [place], will come stay with pt when she's ready to d/c (he's anticipating that will be in about 4-6 weeks) Pt/Family Agrees to Admission and willing to participate: Yes Program Orientation Provided & Reviewed with Pt/Caregiver Including Roles  & Responsibilities: Yes     Decrease burden of Care through IP rehab admission: n/a   Possible need for SNF placement upon discharge: possibly, depending on pt's progress with AIR     Patient Condition: This patient's medical and functional status has changed since the consult dated: 09/03/2019 in which the Rehabilitation Physician determined and documented that the patient's condition is appropriate for intensive rehabilitative care in an inpatient rehabilitation facility. See "History of Present Illness" (above) for medical update. Functional changes are: pt is mod assist +2 x20' for mobility, and min to mod assist for ADLs. Patient's medical and functional status update has been discussed  with the Rehabilitation physician and patient remains appropriate for inpatient rehabilitation. Will admit to inpatient rehab today.   Preadmission Screen Completed By:  Michel Santee, PT, DPT 09/10/2019 9:56 AM ______________________________________________________________________   Discussed status with Dr. Dagoberto Ligas on 09/10/19 at 10:03 AM and received approval for admission today.   Admission Coordinator:  Michel Santee,  PT, DPT time 10:03 AM Sudie Grumbling 09/10/19

## 2019-09-10 NOTE — Progress Notes (Signed)
Physical Therapy Treatment Patient Details Name: Haley Mayo MRN: [id number] DOB: December 22, 1950 Today's Date: 09/10/2019  ASSESSMENT: Pt tolerates treatment well although continues to require significant assistance to perform transfers. Pt demonstrates increased impairment in awareness of her deficits and their impact on her mobility, requesting PT walk her to the shower and wanting to walk other places in the room impulsively. When cued to her deficits pt is able to recognize how this would be unsafe at this time. Pt is generally weak and will continue to benefit from high intensity inpatient PT services along with aggressive mobilization to improve mobility quality and to reduce caregiver burden.  09/10/19 1009  PT Visit Information  Last PT Received On 09/10/19  Assistance Needed +2 (for gait progression)  History of Present Illness Pt is 69 yo female that presented to ED for generalized weakness, pt stated she has been down on the floor at home since 5/7. Reported recent [place] vaccination (5/3). PMH of HLD, HTN. On 5/16 pt with c/o diplopia.CT of head showed SAH 25mm basilar tip aneurysm.  Pt transferred to [facility] for neurosurgery consult. Pt underwent diagnostic cerebral angiogram with stent-supported coil embolization of basila aneurysm on 5/18. +PE. Pt underwent IVC filter placement on 5/19.  Subjective Data  Subjective pt agreeable to PT session, reports the bedside commode is the least comfortable chair she has ever sat on.  Patient Stated Goal to improve mobility  Precautions  Precautions Fall  Restrictions  Weight Bearing Restrictions No  Pain Assessment  Pain Assessment Faces  Faces Pain Scale 6  Pain Location buttocks with sitting on commode  Pain Descriptors / Indicators Grimacing  Pain Intervention(s) Monitored during session  Cognition  Arousal/Alertness Awake/alert  Behavior During Therapy WFL for tasks assessed/performed  Overall Cognitive Status Impaired/Different  from baseline  Area of Impairment Orientation;Memory;Attention;Following commands;Safety/judgement;Awareness;Problem solving  Orientation Level Disoriented to;Situation;Time  Current Attention Level Sustained  Memory Decreased recall of precautions;Decreased short-term memory  Following Commands Follows one step commands consistently;Follows one step commands with increased time  Safety/Judgement Decreased awareness of safety;Decreased awareness of deficits  Awareness Intellectual (pt with reduced awareness this session)  Problem Solving Slow processing;Requires verbal cues;Requires tactile cues;Difficulty sequencing  Bed Mobility  Overal bed mobility Needs Assistance  Bed Mobility Supine to Sit  Supine to sit Mod assist  Transfers  Overall transfer level Needs assistance  Equipment used Rolling walker (2 wheeled)  Transfers Sit to/from Stand  Sit to Stand Mod assist  Stand pivot transfers Max assist  General transfer comment pt performs 3 sit to stands and 3 stand pivot transfers moving from bed to recliner to bedside commode and bakc to recliner. Pt requires significant physical assistance to power up into standing, and turns with short shuffling steps and increased trunk flexion  Ambulation/Gait  Ambulation/Gait assistance  (defer gait training 2/2 high falls risk)  Modified Rankin (Stroke Patients Only)  Pre-Morbid Rankin Score 1 (unsure of pt baseline)  Modified Rankin 4  Balance  Overall balance assessment Needs assistance  Sitting-balance support No upper extremity supported;Feet supported  Sitting balance-Leahy Scale Poor  Sitting balance - Comments minA with R lateral lean  Postural control Right lateral lean  Standing balance-Leahy Scale Poor  Standing balance comment minA to maintain static standing balance  General Comments  General comments (skin integrity, edema, etc.) VSS on RA, pt is incontinent of stool twice during session  PT - End of Session  Equipment  Utilized During Treatment Gait belt  Activity Tolerance Patient tolerated treatment  well  Patient left in chair;with call bell/phone within reach;with chair alarm set  Nurse Communication Mobility status;Need for lift equipment (PT recommend use of lift or STEDY)   PT - Assessment/Plan  PT Plan Current plan remains appropriate  PT Visit Diagnosis Other abnormalities of gait and mobility (R26.89);Muscle weakness (generalized) (M62.81);Other symptoms and signs involving the nervous system (R29.898)  PT Frequency (ACUTE ONLY) Min 4X/week  Follow Up Recommendations CIR;Supervision/Assistance - 24 hour  PT equipment Wheelchair (measurements PT);Wheelchair cushion (measurements PT);Hospital bed;Other (comment)  AM-PAC PT "6 Clicks" Mobility Outcome Measure (Version 2)  Help needed turning from your back to your side while in a flat bed without using bedrails? 3  Help needed moving from lying on your back to sitting on the side of a flat bed without using bedrails? 2  Help needed moving to and from a bed to a chair (including a wheelchair)? 2  Help needed standing up from a chair using your arms (e.g., wheelchair or bedside chair)? 2  Help needed to walk in hospital room? 2  Help needed climbing 3-5 steps with a railing?  1  6 Click Score 12  Consider Recommendation of Discharge To: CIR/SNF/LTACH  PT Goal Progression  Progress towards PT goals Progressing toward goals (slowly)  PT Time Calculation  PT Start Time (ACUTE ONLY) 0936  PT Stop Time (ACUTE ONLY) 1009  PT Time Calculation (min) (ACUTE ONLY) 33 min  PT General Charges  $$ ACUTE PT VISIT 1 Visit  PT Treatments  $Gait Training 8-22 mins  $Therapeutic Activity 8-22 mins     Zenaida Niece, PT, DPT Acute Rehabilitation Pager: [phone]    Zenaida Niece 09/10/2019, 2:44 PM

## 2019-09-10 NOTE — Progress Notes (Signed)
Pt arrived to 4NP13 from [id number].

## 2019-09-10 NOTE — Plan of Care (Signed)
  Problem: Consults Goal: RH BRAIN INJURY PATIENT EDUCATION Description: Description: See Patient Education module for eduction specifics Outcome: Progressing Goal: Skin Care Protocol Initiated - if Braden Score 18 or less Description: If consults are not indicated, leave blank or document N/A Outcome: Progressing Goal: Nutrition Consult-if indicated Outcome: Progressing   Problem: RH BOWEL ELIMINATION Goal: RH STG MANAGE BOWEL WITH ASSISTANCE Description: STG Manage Bowel with mod I Assistance. Outcome: Progressing   Problem: RH BLADDER ELIMINATION Goal: RH STG MANAGE BLADDER WITH ASSISTANCE Description: STG Manage Bladder With mod I Assistance Outcome: Progressing Goal: RH STG MANAGE BLADDER WITH MEDICATION WITH ASSISTANCE Description: STG Manage Bladder With Medication With mod I Assistance. Outcome: Progressing   Problem: RH SKIN INTEGRITY Goal: RH STG SKIN FREE OF INFECTION/BREAKDOWN Description: Pt will be free of skin breakdown prior to DC with min assist  Outcome: Progressing Goal: RH STG MAINTAIN SKIN INTEGRITY WITH ASSISTANCE Description: STG Maintain Skin Integrity With min Assistance. Outcome: Progressing Goal: RH STG ABLE TO PERFORM INCISION/WOUND CARE W/ASSISTANCE Description: STG Able To Perform Incision/Wound Care With min/mod Assistance. Outcome: Progressing   Problem: RH SAFETY Goal: RH STG ADHERE TO SAFETY PRECAUTIONS W/ASSISTANCE/DEVICE Description: STG Adhere to Safety Precautions With cues/reminders Assistance/Device. Outcome: Progressing   Problem: RH KNOWLEDGE DEFICIT BRAIN INJURY Goal: RH STG INCREASE KNOWLEDGE OF SELF CARE AFTER BRAIN INJURY Description: Pt will be able to demonstrate knowledge of fall prevention, safety precautions, medication management, and disease process with mod I assist using handouts/booklets prior to DC Outcome: Progressing

## 2019-09-10 NOTE — Progress Notes (Signed)
  NEUROSURGERY PROGRESS NOTE   No issues overnight.  No concerns this am  EXAM:  BP 118/76 (BP Location: Right Arm)   Pulse 86   Temp 98.3 F (36.8 C) (Oral)   Resp (!) 21   Wt 73.2 kg   SpO2 100%   BMI 31.52 kg/m   Awake, alert, oriented  Speech fluent, confused at times CN grossly intact  MAEW  IMPRESSION/PLAN 69 y.o. female SAH d#15s/p stent-coil fusiform basilar aneurysm, severe symptomatic spasm now resolved. Small PE, asymptomatic. S/P IVC filter placement. CIR has bed available today for discharge. - continue ASA/Plavix for stent - completed 21 day course of nimotop

## 2019-09-10 NOTE — Progress Notes (Signed)
Pt discharged to CIR with report given to 4W RN. Pt belongings packed and pt taken to 4W20 with PIV and foley intact.

## 2019-09-10 NOTE — Progress Notes (Signed)
Patient arrived to unit via BED from 4[facility]. Patient is A/O x3. Denies any pain. Plan of care, patient safety guidelines, and visitor policy has been reviewed with patient. Amanda Cockayne, LPN

## 2019-09-10 NOTE — Progress Notes (Signed)
Physical Medicine and Rehabilitation Consult Reason for Consult: Weakness/headache/diplopia Referring Physician: Dr. Kathyrn Sheriff     HPI: Haley Mayo is a 69 y.o. right-handed female with history of hypertension as well as hyperlipidemia and tobacco abuse.  Per chart review patient lives alone.  Independent prior to admission.  1 level home 4 steps to entry.  Limited support at discharge.  She has 2 children 1 lives in [place] and one lives in [place].  Presented 08/26/2019 to [facility] after being found down for an extended time.  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  CT angiogram of the chest showed filling defect within the proximal right upper lobe apical and posterior segmental arteries consistent with pulmonary embolus.  Moderate cardiomegaly.  There was also 2.3 cm right thyroid heterogeneous nodule.  Venous Doppler studies lower extremity showed no DVT.  Admission chemistries showed a WBC of 27,100, hemoglobin 15.4, CK 3162, lactic acid 1.2, glucose 166, BUN 28, creatinine 0.81, troponin 23, SARS coronavirus negative.  Chest x-ray showed mild bibasilar atelectasis and/or infiltrate.  Placed on broad-spectrum antibiotics.  She was transferred from [facility] to [facility] 08/30/2019 for ongoing follow-up and care.  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 Kathyrn Sheriff.  EEG completed showing no seizure.  She underwent IVC filter placement 09/02/2019 per interventional radiology for pulmonary emboli.  She currently remains on subcutaneous heparin for DVT prophylaxis.  Aspirin and Plavix initiated after embolization.  She is  currently n.p.o. with alternative means of nutritional support.  Blood pressure monitored currently on Nimotop.  Therapy evaluations completed with recommendations of physical medicine rehab consult.   Review of Systems  Constitutional: Negative for chills and fever.  HENT: Negative for hearing loss.   Eyes: Positive for double vision.  Respiratory: Negative for cough and shortness of breath.   Cardiovascular: Negative for chest pain, palpitations and leg swelling.  Gastrointestinal: Positive for constipation. Negative for heartburn, nausea and vomiting.  Genitourinary: Negative for flank pain and hematuria.  Musculoskeletal: Positive for joint pain and myalgias.  Skin: Negative for rash.  Neurological: Positive for dizziness and headaches.  All other systems reviewed and are negative.       Past Medical History:  Diagnosis Date  . HLD (hyperlipidemia)    . HTN (hypertension)      controlled  . Leukocytosis      ?   Marland Kitchen Neoplasm of ear      left ear lobe  . Obesity    . Wears dentures      upper         Past Surgical History:  Procedure Laterality Date  . ABDOMINAL HYSTERECTOMY   Age 47  . COLONOSCOPY   09/2004    normal, rpt 10 yrs  . EAR CYST EXCISION Left 09/27/2017    Procedure: EXCISION EAR LOBE CYST;  Surgeon: Beverly Gust, MD;  Location: [place];  Service: ENT;  Laterality: Left;  LOCAL ONLY  . IR IVC FILTER PLMT / S&I /IMG GUID/MOD SED   09/02/2019  . OOPHORECTOMY      . RADIOLOGY WITH ANESTHESIA N/A 09/01/2019    Procedure: IR WITH ANESTHESIA;  Surgeon:  Consuella Lose, MD;  Location: [place];  Service: Radiology;  Laterality: N/A;  . TOTAL VAGINAL HYSTERECTOMY   05/2001    heavy bleeding, fibroids         Family History  Problem Relation Age of Onset  . Ulcers Father    . Pneumonia Mother    . Diabetes Sister    . Stroke Brother    . Lupus Sister    . Stroke Sister          hemorrhage after fall  . Lupus Sister    . Rheum arthritis Sister            ?  . Diabetes Sister    . Other Sister          peritoneal dialysis    Social History:  reports that she has been smoking cigarettes. She has a 40.00 pack-year smoking history. She uses smokeless tobacco. She reports that she does not drink alcohol or use drugs. Allergies: No Known Allergies       Medications Prior to Admission  Medication Sig Dispense Refill  . acetaminophen (TYLENOL) 325 MG tablet Take 2 tablets (650 mg total) by mouth every 6 (six) hours as needed for mild pain, fever or headache.      Marland Kitchen amLODipine-benazepril (LOTREL) 10-20 MG capsule Take 1 capsule by mouth every evening.    3  . Coenzyme Q10-Omega 3 Fatty Acd (HEALTHY HEART) EMUL Take 1 capsule by mouth daily.        . Multiple Vitamins-Minerals (EMERGEN-C VITAMIN C PO) Take 1 tablet by mouth daily.       Marland Kitchen amLODipine (NORVASC) 10 MG tablet Take 1 tablet (10 mg total) by mouth daily. (Patient not taking: Reported on 08/31/2019)      . benazepril (LOTENSIN) 20 MG tablet Take 1 tablet (20 mg total) by mouth daily. (Patient not taking: Reported on 08/31/2019)      . ceFEPIme 2 g in sodium chloride 0.9 % 100 mL Inject 2 g into the vein every 8 (eight) hours. (Patient not taking: Reported on 08/31/2019)      . enoxaparin (LOVENOX) 40 MG/0.4ML injection Inject 0.4 mLs (40 mg total) into the skin daily. (Patient not taking: Reported on 08/31/2019) 0 mL    . niMODipine (NIMOTOP) 30 MG capsule Take 2 capsules (60 mg total) by mouth every 4 (four) hours. (Patient not taking: Reported on 08/31/2019)      . polyethylene glycol (MIRALAX / GLYCOLAX) 17 g packet Take 17 g by mouth daily. (Patient not taking: Reported on 08/31/2019) 14 each 0  . vancomycin (VANCOREADY) 1250 MG/250ML SOLN Inject 250 mLs (1,250 mg total) into the vein daily. (Patient not taking: Reported on 08/31/2019)          Home: Home Living Family/patient expects to be discharged to:: Private residence Living Arrangements: Alone Available Help at Discharge: Other  (Comment) Type of Home: House Home Access: Stairs to enter [employer] of Steps: 4 Entrance Stairs-Rails: Can reach both Home Layout: One level Bathroom Shower/Tub: Chiropodist: Standard Home Equipment: Grab bars - tub/shower Additional Comments: Pt reports she lives alone, and has limited support at discharge   Functional History: Prior Function Level of Independence: Independent Comments: pt does not drive, uses [employer] transportation for groceries/doctors appointments. Otherwise indep for all ADL/IADL management. Retired from [employer]:  Mobility: Bed Mobility Overal bed mobility: Needs Assistance Bed Mobility: Supine to Sit Supine to sit: Mod assist, HOB elevated General bed  mobility comments: difficulty with problem solving and sustaining attention during mobility Transfers Overall transfer level: Needs assistance Equipment used: 1 person hand held assist, 2 person hand held assist Transfers: Sit to/from Stand, Squat Pivot Transfers Sit to Stand: Max assist, +2 physical assistance(maxA of 1 to come to incomplete stand at edge of recliner) Squat pivot transfers: Max assist, +2 physical assistance General transfer comment: pt unable to maintain knee extension bilaterally at this time, requires cues to fully extend knees ADL: ADL Overall ADL's : Needs assistance/impaired Eating/Feeding: NPO Grooming: Moderate assistance, Cueing for sequencing, Cueing for safety, Sitting Grooming Details (indicate cue type and reason): On entry, pt asleep in bed with yonker in her mouth; Attentional deficits Upper Body Bathing: Moderate assistance, Sitting Lower Body Bathing: Maximal assistance, Sit to/from stand Upper Body Dressing : Maximal assistance, Sitting Lower Body Dressing: Total assistance, Sit to/from stand Toileting- Water quality scientist and Hygiene: Total assistance Toileting - Clothing Manipulation Details (indicate cue type and  reason): incontinenent of BM during session Functional mobility during ADLs: +2 for physical assistance, Maximal assistance, Cueing for safety, Cueing for sequencing General ADL Comments: Unable to stand fully upright   Cognition: Cognition Overall Cognitive Status: Impaired/Different from baseline Arousal/Alertness: (drowsy) Orientation Level: Oriented X4 Attention: Selective Selective Attention: Impaired Selective Attention Impairment: Verbal basic Memory: Impaired Memory Impairment: Storage deficit Awareness: Impaired Awareness Impairment: Intellectual impairment Comments: delayed processing Cognition Arousal/Alertness: (letahrgic initially but awakening with mobility) Behavior During Therapy: Flat affect Overall Cognitive Status: Impaired/Different from baseline Area of Impairment: Attention, Following commands, Memory, Safety/judgement, Awareness, Problem solving, Orientation Orientation Level: Disoriented to, Time, Situation Current Attention Level: Sustained Memory: Decreased recall of precautions, Decreased short-term memory Following Commands: Follows one step commands with increased time(slowed responses) Safety/Judgement: Decreased awareness of safety, Decreased awareness of deficits Awareness: Emergent Problem Solving: Slow processing, Decreased initiation, Difficulty sequencing, Requires verbal cues, Requires tactile cues General Comments: Pt lethargic and demonstrates difficulty maintaining arousal.  she has periods where she is very conversant and follows commands well, to being very sluggish and difficult to engage    Blood pressure (!) 150/63, pulse 77, temperature 98.7 F (37.1 C), temperature source Oral, resp. rate 17, weight 73 kg, SpO2 95 %.   Physical Exam    General: Alert and oriented x 3 (few days off on date), No apparent distress, Patient is lethargic but arousable. HEENT: Head is normocephalic, atraumatic, PERRLA, EOMI, sclera anicteric, Nasogastric  tube in place. Neck: Supple without JVD or lymphadenopathy Heart: Reg rate and rhythm. No murmurs rubs or gallops Chest: CTA bilaterally without wheezes, rales, or rhonchi; no distress Abdomen: Soft, non-tender, non-distended, bowel sounds positive. Extremities: No clubbing, cyanosis, or edema. Pulses are 2+ Skin: Clean and intact without signs of breakdown Neuro/MSK: Makes eye contact with examiner. Motor function is grossly 4+/5, with the exception of 4-/5 in bilateral lower extremity KE, PF, and DF. Follows simple commands.  Psych: Pt's affect is appropriate. Pt is cooperative. Flat but pleasant and conversational at times   Lab Results Last 24 Hours       Results for orders placed or performed during the hospital encounter of 08/31/19 (from the past 24 hour(s))  Magnesium     Status: None    Collection Time: 09/03/19  6:50 AM  Result Value Ref Range    Magnesium 1.7 1.7 - 2.4 mg/dL  Phosphorus     Status: Abnormal    Collection Time: 09/03/19  6:50 AM  Result Value Ref Range    Phosphorus 1.6 (L)  2.5 - 4.6 mg/dL  Basic metabolic panel     Status: Abnormal    Collection Time: 09/03/19  6:50 AM  Result Value Ref Range    Sodium 142 135 - 145 mmol/L    Potassium 2.2 (LL) 3.5 - 5.1 mmol/L    Chloride 108 98 - 111 mmol/L    CO2 25 22 - 32 mmol/L    Glucose, Bld 193 (H) 70 - 99 mg/dL    BUN 9 8 - 23 mg/dL    Creatinine, Ser 0.46 0.44 - 1.00 mg/dL    Calcium 7.6 (L) 8.9 - 10.3 mg/dL    GFR calc non Af Amer >60 >60 mL/min    GFR calc Af Amer >60 >60 mL/min    Anion gap 9 5 - 15  Glucose, capillary     Status: Abnormal    Collection Time: 09/03/19  7:56 AM  Result Value Ref Range    Glucose-Capillary 164 (H) 70 - 99 mg/dL  Glucose, capillary     Status: Abnormal    Collection Time: 09/03/19 11:30 AM  Result Value Ref Range    Glucose-Capillary 192 (H) 70 - 99 mg/dL  Glucose, capillary     Status: Abnormal    Collection Time: 09/03/19  3:27 PM  Result Value Ref Range     Glucose-Capillary 176 (H) 70 - 99 mg/dL  Magnesium     Status: Abnormal    Collection Time: 09/03/19  3:45 PM  Result Value Ref Range    Magnesium 2.5 (H) 1.7 - 2.4 mg/dL  Phosphorus     Status: None    Collection Time: 09/03/19  3:45 PM  Result Value Ref Range    Phosphorus 3.1 2.5 - 4.6 mg/dL  Basic metabolic panel     Status: Abnormal    Collection Time: 09/03/19  3:45 PM  Result Value Ref Range    Sodium 140 135 - 145 mmol/L    Potassium 3.1 (L) 3.5 - 5.1 mmol/L    Chloride 103 98 - 111 mmol/L    CO2 25 22 - 32 mmol/L    Glucose, Bld 206 (H) 70 - 99 mg/dL    BUN 7 (L) 8 - 23 mg/dL    Creatinine, Ser 0.40 (L) 0.44 - 1.00 mg/dL    Calcium 7.7 (L) 8.9 - 10.3 mg/dL    GFR calc non Af Amer >60 >60 mL/min    GFR calc Af Amer >60 >60 mL/min    Anion gap 12 5 - 15  Glucose, capillary     Status: Abnormal    Collection Time: 09/03/19  7:20 PM  Result Value Ref Range    Glucose-Capillary 132 (H) 70 - 99 mg/dL  Glucose, capillary     Status: Abnormal    Collection Time: 09/03/19 11:05 PM  Result Value Ref Range    Glucose-Capillary 176 (H) 70 - 99 mg/dL  Glucose, capillary     Status: Abnormal    Collection Time: 09/04/19  3:09 AM  Result Value Ref Range    Glucose-Capillary 178 (H) 70 - 99 mg/dL       Imaging Results (Last 48 hours)  CT ANGIO HEAD W OR WO CONTRAST   Addendum Date: 09/02/2019   ADDENDUM REPORT: 09/02/2019 07:47 ADDENDUM: These results were called by telephone at the time of interpretation on 09/02/2019 at 7:47 am to provider Dr. Kathyrn Sheriff , who verbally acknowledged these results. Electronically Signed   By: Kellie Simmering DO   On: 09/02/2019 07:47  Result Date: 09/02/2019 CLINICAL DATA:  Neuro deficit, acute, stroke suspected. EXAM: CT ANGIOGRAPHY HEAD TECHNIQUE: Multidetector CT imaging of the head was performed using the standard protocol during bolus administration of intravenous contrast. Multiplanar CT image reconstructions and MIPs were obtained to evaluate  the vascular anatomy. CONTRAST:  78mL OMNIPAQUE IOHEXOL 350 MG/ML SOLN COMPARISON:  CT head/neck 08/30/2019, head CT 08/30/2019, FINDINGS: CT HEAD Brain: Multifocal subarachnoid hemorrhage has not significantly changed in distribution or extent as compared to prior head CT examination 08/30/2019. As before, the subarachnoid hemorrhage is most notable overlying the bilateral cerebral convexities and within the posterior fossa. Redemonstrated small volume intraventricular hemorrhage within the occipital horns of the lateral ventricles. Also unchanged, there is thin subdural hemorrhage overlying the tentorium. No acute demarcated cortical infarct is identified. Redemonstrated chronic right cerebellar infarct. No evidence of hydrocephalus at this time.  No midline shift. Vascular: Reported below. Skull: Normal. Negative for fracture or focal lesion. Sinuses: Frothy secretions within the right sphenoid sinus. No significant mastoid effusion Orbits: Visualized orbits show no acute finding. CTA HEAD Anterior circulation: The intracranial internal carotid arteries are patent. Mild calcified plaque within these vessels without significant stenosis. The M1 middle cerebral arteries are patent. There is severe narrowing of the M1 right middle cerebral artery (series 14, image 21). There is severe narrowing of the superior and inferior division proximal M2 left middle cerebral arteries (series 14, image 21). There is severe narrowing of the A1 and proximal A2 anterior cerebral arteries bilaterally. Posterior circulation: There are sites of moderate/severe narrowing within the V4 right vertebral artery. The V4 left vertebral artery is patent without significant stenosis. Apparent moderate narrowing within the mid to distal basilar artery, immediately adjacent to the stent marker (series 12, image 95). Severe narrowing of the proximal right posterior cerebral artery immediately beyond the stented portion. Severe narrowing of the  stented portion of the proximal left posterior cerebral artery and beyond. Posterior communicating arteries are poorly delineated. Venous sinuses: Poorly assessed due to contrast timing. Anatomic variants: As described IMPRESSION: CT head: 1. Subarachnoid hemorrhage, subdural hemorrhage and intraventricular hemorrhage has not significantly changed in extent or distribution as compared to head CT 08/30/2019. 2. No acute demarcated cortical infarct is identified. 3. No evidence of hydrocephalus. 4. Redemonstrated chronic right cerebellar infarct. CTA head: 1. Interval stent assisted coil embolization of a basilar tip aneurysm. 2. Multifocal severe narrowing of the proximal branch vessels of the circle-of-Willis consistent with severe basal spasm, likely with superimposed atherosclerotic disease. Most notably there is severe narrowing of the M1 right middle cerebral artery, proximal M2 left MCA branches, proximal anterior cerebral arteries and proximal posterior cerebral arteries. Additionally, there are sites of moderate/severe narrowing within the V4 right vertebral artery and focal moderate narrowing within the mid to distal basilar artery. Electronically Signed: By: Kellie Simmering DO On: 09/02/2019 07:37    IR IVC FILTER PLMT / S&I Burke Keels GUID/MOD SED   Result Date: 09/02/2019 INDICATION: 70 year old female with pulmonary embolism and inability to anticoagulate referred for filter placement EXAM: ULTRASOUND-GUIDED ACCESS RIGHT INTERNAL JUGULAR VEIN. PLACEMENT OF RETRIEVABLE IVC FILTER MEDICATIONS: None. ANESTHESIA/SEDATION: None. FLUOROSCOPY TIME:  Fluoroscopy Time: 0 minutes 30 seconds COMPLICATIONS: None PROCEDURE: The procedure, risks, benefits, and alternatives were explained to the patient. Specific risks discussed include bleeding, infection, contrast reaction, renal failure, IVC filter fracture, migration, iliocaval thrombus (3-4% incidence), need for further procedure, need for further surgery, pulmonary  embolism, cardiopulmonary collapse, death. Questions regarding the procedure were encouraged and answered. The  patient understands and consents to the procedure. Ultrasound survey was performed with images stored and sent to PACs. The neck was prepped with chlorhexidine in a sterile fashion, and a sterile drape was applied covering the operative field. A sterile gown and sterile gloves were used for the procedure. Local anesthesia was provided with 1% Lidocaine. A micropuncture needle was used access the right internal jugular vein under ultrasound. With excellent venous blood flow returned, and an .018 micro wire was passed through the needle. Small incision was made with an 11 blade scalpel. The needle was removed, and a micropuncture sheath was placed over the wire. The inner dilator and wire were removed, and an 035 Bentson wire was advanced under fluoroscopy into the IVC. Serial dilation of the soft tissue tract was performed with an 8 [place] dilator and subsequently a 10 [place] dilator. The delivery sheath for a retrievable Bard Denali filter was passed over the Bentson wire into the IVC. The wire was removed and small contrast was used to confirm IVC location. IVC cavagram performed. Dilator was removed, and the IVC filter was then delivered, positioned below the lowest renal vein, with the tip of the filter projecting into the flow stream of the right renal vein. Repeat cavagram performed, and the catheter was removed. Manual pressure was used for hemostasis. Patient tolerated the procedure well and remained hemodynamically stable throughout. No complications were encountered and no significant blood loss was encounter. IMPRESSION: Status post ultrasound-guided access right internal jugular vein and placement of retrievable IVC filter. Signed, Dulcy Fanny. Dellia Nims, RPVI [facility] PLAN: This IVC filter is potentially retrievable. The patient can be  assessed for assessed for filter retrieval by Interventional Radiology in approximately 8-12 weeks. Further recommendations regarding filter retrieval, continued surveillance or declaration of device permanence, will be made at that time. Electronically Signed   By: Corrie Mckusick D.O.   On: 09/02/2019 17:14    DG CHEST PORT 1 VIEW   Result Date: 09/02/2019 CLINICAL DATA:  Central line placement EXAM: PORTABLE CHEST 1 VIEW COMPARISON:  None. FINDINGS: Bilateral interstitial thickening. No pleural effusion or pneumothorax. Stable cardiomegaly. Nasogastric tube coursing below the diaphragm. Left subclavian central venous catheter with the tip projecting over the SVC. IMPRESSION: Left subclavian central venous catheter with the tip projecting over the SVC. Cardiomegaly with pulmonary vascular congestion. Electronically Signed   By: Kathreen Devoid   On: 09/02/2019 12:43    EEG adult   Result Date: 09/02/2019 Alexis Goodell, MD     09/02/2019  6:21 PM ELECTROENCEPHALOGRAM REPORT Patient: Maree Erie       Room #: 4N17C EEG No. ID: [id number] Age: 69 y.o.        Sex: female Requesting Physician: Kathyrn Sheriff Report Date:  09/02/2019       Interpreting Physician: Alexis Goodell History: MISSEY MCCRIGHT is an 69 y.o. female with altered mental status Medications: ASA, Rocephin, Plavix, Nimotop, Levophed, Neosynephrine Conditions of Recording:  This is a 21 channel routine scalp EEG performed with bipolar and monopolar montages arranged in accordance to the international 10/20 system of electrode placement. One channel was dedicated to EKG recording. The patient is in the altered state. Description:  The background activity is slow and poorly organized.  It consists of low voltage activity in the delta theta continuum.  This activity is continuous and diffusely distributed. No epileptiform activity is noted. Hyperventilation and intermittent photic stimulation were not performed. IMPRESSION: This is an abnormal  EEG secondary to general background slowing.  This finding may be seen with a diffuse disturbance that is etiologically nonspecific, but may include a metabolic encephalopathy, among other possibilities.  No epileptiform activity was noted.  Alexis Goodell, MD Neurology [phone] 09/02/2019, 6:12 PM    ECHOCARDIOGRAM COMPLETE   Result Date: 09/03/2019    ECHOCARDIOGRAM REPORT   Patient Name:   CLELLA MCCOSH Date of Exam: 09/03/2019 Medical Rec #:  [id number]           Height:       60.0 in Accession #:    [id number]          Weight:       160.9 lb Date of Birth:  1950/12/10          BSA:          1.702 m Patient Age:    53 years            BP:           165/75 mmHg Patient Gender: F                   HR:           80 bpm. Exam Location:  Inpatient Procedure: 2D Echo, Cardiac Doppler and Color Doppler Indications:    I26.02 Pulmonary embolus  History:        Patient has prior history of Echocardiogram examinations, most                 recent 08/29/2019. Risk Factors:Hypertension and Dyslipidemia.  Sonographer:    Jonelle Sidle Dance Referring Phys: [id number] PAULA B SIMPSON  Sonographer Comments: Technically difficult study due to poor echo windows. IMPRESSIONS  1. Left ventricular ejection fraction, by estimation, is 65 to 70%. The left ventricle has normal function. The left ventricle has no regional wall motion abnormalities. Left ventricular diastolic parameters are consistent with Grade I diastolic dysfunction (impaired relaxation).  2. Right ventricular systolic function is normal. The right ventricular size is mildly enlarged. Tricuspid regurgitation signal is inadequate for assessing PA pressure.  3. The mitral valve is normal in structure. No evidence of mitral valve regurgitation. No evidence of mitral stenosis.  4. The aortic valve is grossly normal. Aortic valve regurgitation is not visualized. Mild aortic valve sclerosis is present, with no evidence of aortic valve stenosis.  5. Increased flow  velocities across valves may be secondary to anemia, thyrotoxicosis, hyperdynamic ventricle or high flow state.  6. The inferior vena cava is dilated in size with >50% respiratory variability, suggesting right atrial pressure of 8 mmHg. FINDINGS  Left Ventricle: Left ventricular ejection fraction, by estimation, is 65 to 70%. The left ventricle has normal function. The left ventricle has no regional wall motion abnormalities. The left ventricular internal cavity size was normal in size. There is  no left ventricular hypertrophy. Left ventricular diastolic parameters are consistent with Grade I diastolic dysfunction (impaired relaxation). Right Ventricle: The right ventricular size is mildly enlarged. No increase in right ventricular wall thickness. Right ventricular systolic function is normal. Tricuspid regurgitation signal is inadequate for assessing PA pressure. Left Atrium: Left atrial size was normal in size. Right Atrium: Right atrial size was normal in size. Pericardium: Trivial pericardial effusion is present. Mitral Valve: The mitral valve is normal in structure. Normal mobility of the mitral valve leaflets. No evidence of mitral valve regurgitation. No evidence of mitral valve stenosis. Tricuspid Valve: The tricuspid valve is normal in structure. Tricuspid valve regurgitation  is trivial. No evidence of tricuspid stenosis. Aortic Valve: The aortic valve is grossly normal. Aortic valve regurgitation is not visualized. Mild aortic valve sclerosis is present, with no evidence of aortic valve stenosis. Aortic valve mean gradient measures 12.7 mmHg. Aortic valve peak gradient measures 23.6 mmHg. Aortic valve area, by VTI measures 2.47 cm. Pulmonic Valve: The pulmonic valve was normal in structure. Pulmonic valve regurgitation is not visualized. No evidence of pulmonic stenosis. Aorta: The aortic root is normal in size and structure. Venous: The inferior vena cava is dilated in size with greater than 50%  respiratory variability, suggesting right atrial pressure of 8 mmHg. IAS/Shunts: No atrial level shunt detected by color flow Doppler.  LEFT VENTRICLE PLAX 2D LVIDd:         4.40 cm  Diastology LVIDs:         3.20 cm  LV e' lateral:   6.64 cm/s LV PW:         1.20 cm  LV E/e' lateral: 15.2 LV IVS:        0.90 cm  LV e' medial:    5.55 cm/s LVOT diam:     2.10 cm  LV E/e' medial:  18.2 LV SV:         111 LV SV Index:   65 LVOT Area:     3.46 cm  RIGHT VENTRICLE             IVC RV Basal diam:  3.20 cm     IVC diam: 2.30 cm RV Mid diam:    2.40 cm RV S prime:     14.00 cm/s TAPSE (M-mode): 1.9 cm LEFT ATRIUM             Index LA diam:        3.20 cm 1.88 cm/m LA Vol (A2C):   51.0 ml 29.97 ml/m LA Vol (A4C):   45.8 ml 26.91 ml/m LA Biplane Vol: 48.7 ml 28.61 ml/m  AORTIC VALVE AV Area (Vmax):    2.30 cm AV Area (Vmean):   2.04 cm AV Area (VTI):     2.47 cm AV Vmax:           242.67 cm/s AV Vmean:          161.667 cm/s AV VTI:            0.449 m AV Peak Grad:      23.6 mmHg AV Mean Grad:      12.7 mmHg LVOT Vmax:         161.00 cm/s LVOT Vmean:        95.000 cm/s LVOT VTI:          0.320 m LVOT/AV VTI ratio: 0.71  AORTA Ao Root diam: 3.00 cm Ao Asc diam:  3.10 cm MITRAL VALVE MV Area (PHT): 2.80 cm     SHUNTS MV Decel Time: 271 msec     Systemic VTI:  0.32 m MV E velocity: 101.00 cm/s  Systemic Diam: 2.10 cm MV A velocity: 108.00 cm/s MV E/A ratio:  0.94 Cherlynn Kaiser MD Electronically signed by Cherlynn Kaiser MD Signature Date/Time: 09/03/2019/12:30:30 PM    Final     VAS [place] TRANSCRANIAL DOPPLER   Result Date: 09/03/2019  Transcranial Doppler Indications: Subarachnoid hemorrhage. History: Basilar artery aneurysm. History of hypertension, hyperlipidemia and smoking. Limitations for diagnostic windows: Unable to insonate right transtemporal window. Unable to insonate left transtemporal window. Performing Technologist: Oda Cogan RDMS, RVT  Examination Guidelines: A complete evaluation includes B-mode  imaging, spectral Doppler, color Doppler, and power Doppler as needed of all accessible portions of each vessel. Bilateral testing is considered an integral part of a complete examination. Limited examinations for reoccurring indications may be performed as noted.  +----------+-------------+----------+-----------+---------+ RIGHT TCD Right VM (cm)Depth (cm)Pulsatility Comment  +----------+-------------+----------+-----------+---------+ MCA                                         no window +----------+-------------+----------+-----------+---------+ ACA                                         no window +----------+-------------+----------+-----------+---------+ Term ICA                                    no window +----------+-------------+----------+-----------+---------+ PCA                                         no window +----------+-------------+----------+-----------+---------+ Opthalmic     65.00                  2.2              +----------+-------------+----------+-----------+---------+ ICA siphon    88.00                  101              +----------+-------------+----------+-----------+---------+ Vertebral    -55.00                                   +----------+-------------+----------+-----------+---------+  +----------+------------+----------+-----------+---------+ LEFT TCD  Left VM (cm)Depth (cm)Pulsatility Comment  +----------+------------+----------+-----------+---------+ MCA                                        no window +----------+------------+----------+-----------+---------+ ACA                                        no window +----------+------------+----------+-----------+---------+ Term ICA                                   no window +----------+------------+----------+-----------+---------+ PCA                                        no window +----------+------------+----------+-----------+---------+ Opthalmic     43.00                  2.2              +----------+------------+----------+-----------+---------+ ICA siphon   103.00     65.00       1.5              +----------+------------+----------+-----------+---------+ Vertebral    -31.00                                  +----------+------------+----------+-----------+---------+  +------------+-------+-------------+  VM cm/s   Comment    +------------+-------+-------------+ Prox Basilar-84.00               +------------+-------+-------------+ Dist Basilar       Not insonated +------------+-------+-------------+ Summary:  Limited study due to absent bitemporal and pooor suboccipital window. Elevated bilateral carotid siphon mean flow velocities may suggest mild vasospasm. Mildy elevatedl mean flow velocities in b both opthalmic artries of unclear significance *See table(s) above for TCD measurements and observations.  Diagnosing physician: Antony Contras MD Electronically signed by Antony Contras MD on 09/03/2019 at 8:20:42 AM.    Final      Assessment/Plan: Diagnosis: Subarachnoid hemorrhage 1. Does the need for close, 24 hr/day medical supervision in concert with the patient's rehab needs make it unreasonable for this patient to be served in a less intensive setting? Yes 2. Co-Morbidities requiring supervision/potential complications: HLD, leukocytosis, essential HTN, smoking, headache, diplopia, dysphagia 3. Due to bladder management, bowel management, safety, skin/wound care, disease management, medication administration, pain management and patient education, does the patient require 24 hr/day rehab nursing? Yes 4. Does the patient require coordinated care of a physician, rehab nurse, therapy disciplines of PT, OT, SLP to address physical and functional deficits in the context of the above medical diagnosis(es)? Yes Addressing deficits in the following areas: balance, endurance, locomotion, strength, transferring, bowel/bladder  control, bathing, dressing, feeding, grooming, toileting, cognition, speech, language, swallowing and psychosocial support 5. Can the patient actively participate in an intensive therapy program of at least 3 hrs of therapy per day at least 5 days per week? Yes 6. The potential for patient to make measurable gains while on inpatient rehab is excellent 7. Anticipated functional outcomes upon discharge from inpatient rehab are min assist  with PT, min assist with OT, modified independent/Swith SLP. 8. Estimated rehab length of stay to reach the above functional goals is: 16-20 days 9. Anticipated discharge destination: Home 10. Overall Rehab/Functional Prognosis: excellent   RECOMMENDATIONS: This patient's condition is appropriate for continued rehabilitative care in the following setting: CIR Patient has agreed to participate in recommended program. Yes Note that insurance prior authorization may be required for reimbursement for recommended care.   Comment: Mrs. Zellmann would be an excellent CIR candidate once medically stable (currently requiring ICU monitoring and phenylephrine drip) and if family support can be confirmed (patient states she lives alone). Thank you for this consult. We will continue to follow in Mrs. Lenger's care.    Lavon Paganini Angiulli, PA-C 09/04/2019    I have personally performed a face to face diagnostic evaluation, including, but not limited to relevant history and physical exam findings, of this patient and developed relevant assessment and plan.  Additionally, I have reviewed and concur with the physician assistant's documentation above.   Leeroy Cha, MD

## 2019-09-10 NOTE — Progress Notes (Signed)
Inpatient Rehab Admissions Coordinator:   I have a bed available for this patient today. Neurosurgery team to evaluate for d/c and confirm she is ready. Will follow up.  Shann Medal, PT, DPT Admissions Coordinator [phone] 09/10/19  9:53 AM

## 2019-09-11 ENCOUNTER — Inpatient Hospital Stay (HOSPITAL_COMMUNITY): Payer: Medicare Other | Admitting: Physical Therapy

## 2019-09-11 ENCOUNTER — Inpatient Hospital Stay (HOSPITAL_COMMUNITY): Payer: Medicare Other | Admitting: Speech Pathology

## 2019-09-11 ENCOUNTER — Inpatient Hospital Stay (HOSPITAL_COMMUNITY): Payer: Medicare Other

## 2019-09-11 ENCOUNTER — Other Ambulatory Visit: Payer: Self-pay

## 2019-09-11 LAB — COMPREHENSIVE METABOLIC PANEL
ALT: 85 U/L — ABNORMAL HIGH (ref 0–44)
AST: 26 U/L (ref 15–41)
Albumin: 3.7 g/dL (ref 3.5–5.0)
Alkaline Phosphatase: 85 U/L (ref 38–126)
Anion gap: 12 (ref 5–15)
BUN: 18 mg/dL (ref 8–23)
CO2: 25 mmol/L (ref 22–32)
Calcium: 10 mg/dL (ref 8.9–10.3)
Chloride: 101 mmol/L (ref 98–111)
Creatinine, Ser: 0.64 mg/dL (ref 0.44–1.00)
GFR calc Af Amer: >60 mL/min (ref >60)
GFR calc non Af Amer: >60 mL/min (ref >60)
Glucose, Bld: 108 mg/dL — ABNORMAL HIGH (ref 70–99)
Potassium: 4.1 mmol/L (ref 3.5–5.1)
Sodium: 138 mmol/L (ref 135–145)
Total Bilirubin: 0.4 mg/dL (ref 0.3–1.2)
Total Protein: 7.2 g/dL (ref 6.5–8.1)

## 2019-09-11 LAB — CBC WITH DIFFERENTIAL/PLATELET
Abs Immature Granulocytes: 0.15 10*3/uL — ABNORMAL HIGH (ref 0.00–0.07)
Basophils Absolute: 0.1 10*3/uL (ref 0.0–0.1)
Basophils Relative: 0 %
Eosinophils Absolute: 0.4 10*3/uL (ref 0.0–0.5)
Eosinophils Relative: 3 %
HCT: 36.9 % (ref 36.0–46.0)
Hemoglobin: 12.3 g/dL (ref 12.0–15.0)
Immature Granulocytes: 1 %
Lymphocytes Relative: 13 %
Lymphs Abs: 1.9 10*3/uL (ref 0.7–4.0)
MCH: 28.7 pg (ref 26.0–34.0)
MCHC: 33.3 g/dL (ref 30.0–36.0)
MCV: 86 fL (ref 80.0–100.0)
Monocytes Absolute: 1 10*3/uL (ref 0.1–1.0)
Monocytes Relative: 7 %
Neutro Abs: 10.9 10*3/uL — ABNORMAL HIGH (ref 1.7–7.7)
Neutrophils Relative %: 76 %
Platelets: 476 10*3/uL — ABNORMAL HIGH (ref 150–400)
RBC: 4.29 MIL/uL (ref 3.87–5.11)
RDW: 16.8 % — ABNORMAL HIGH (ref 11.5–15.5)
WBC: 14.4 10*3/uL — ABNORMAL HIGH (ref 4.0–10.5)
nRBC: 0 % (ref 0.0–0.2)

## 2019-09-11 LAB — GLUCOSE, CAPILLARY
Glucose-Capillary: 114 mg/dL — ABNORMAL HIGH (ref 70–99)
Glucose-Capillary: 95 mg/dL (ref 70–99)
Glucose-Capillary: 105 mg/dL — ABNORMAL HIGH (ref 70–99)
Glucose-Capillary: 105 mg/dL — ABNORMAL HIGH (ref 70–99)

## 2019-09-11 MED ORDER — BLOOD PRESSURE CONTROL BOOK
Freq: Once | Status: AC
  Filled 2019-09-11: qty 1

## 2019-09-11 MED ORDER — LIVING WELL WITH DIABETES BOOK
Freq: Once | Status: AC
  Filled 2019-09-11: qty 1

## 2019-09-11 MED ORDER — TOPIRAMATE 25 MG PO TABS
25.0000 mg | ORAL_TABLET | Freq: Every day | ORAL | Status: DC
  Administered 2019-09-11 – 2019-09-24 (×14): 25 mg via ORAL
  Filled 2019-09-11 (×14): qty 1

## 2019-09-11 NOTE — Progress Notes (Signed)
Pt concerned about keeping her foley clean and free from infection. Pt was educated on department policy for cleaning the foley. Pt is A&O x2 at this time. Disoriented to situation and time. Pt denies pain.

## 2019-09-11 NOTE — Progress Notes (Signed)
Patient had orders to D/C foley. Foley has been successful removed. Patient denies any pain at this time. Will continue to monitor. Amanda Cockayne, LPN

## 2019-09-11 NOTE — Progress Notes (Signed)
Inpatient Rehabilitation  Patient information reviewed and entered into eRehab system by Marycarmen Hagey M. Kiree Dejarnette, M.A., CCC/SLP, PPS Coordinator.  Information including medical coding, functional ability and quality indicators will be reviewed and updated through discharge.    

## 2019-09-11 NOTE — Plan of Care (Signed)
  Problem: Consults Goal: RH BRAIN INJURY PATIENT EDUCATION Description: Description: See Patient Education module for eduction specifics Outcome: Progressing Goal: Skin Care Protocol Initiated - if Braden Score 18 or less Description: If consults are not indicated, leave blank or document N/A Outcome: Progressing Goal: Nutrition Consult-if indicated Outcome: Progressing   Problem: RH BOWEL ELIMINATION Goal: RH STG MANAGE BOWEL WITH ASSISTANCE Description: STG Manage Bowel with mod I Assistance. Outcome: Progressing   Problem: RH BLADDER ELIMINATION Goal: RH STG MANAGE BLADDER WITH ASSISTANCE Description: STG Manage Bladder With mod I Assistance Outcome: Progressing Goal: RH STG MANAGE BLADDER WITH MEDICATION WITH ASSISTANCE Description: STG Manage Bladder With Medication With mod I Assistance. Outcome: Progressing   Problem: RH SKIN INTEGRITY Goal: RH STG SKIN FREE OF INFECTION/BREAKDOWN Description: Pt will be free of skin breakdown prior to DC with min assist  Outcome: Progressing Goal: RH STG MAINTAIN SKIN INTEGRITY WITH ASSISTANCE Description: STG Maintain Skin Integrity With min Assistance. Outcome: Progressing Goal: RH STG ABLE TO PERFORM INCISION/WOUND CARE W/ASSISTANCE Description: STG Able To Perform Incision/Wound Care With min/mod Assistance. Outcome: Progressing   Problem: RH SAFETY Goal: RH STG ADHERE TO SAFETY PRECAUTIONS W/ASSISTANCE/DEVICE Description: STG Adhere to Safety Precautions With cues/reminders Assistance/Device. Outcome: Progressing   Problem: RH KNOWLEDGE DEFICIT BRAIN INJURY Goal: RH STG INCREASE KNOWLEDGE OF SELF CARE AFTER BRAIN INJURY Description: Pt will be able to demonstrate knowledge of fall prevention, safety precautions, medication management, and disease process with mod I assist using handouts/booklets prior to DC Outcome: Progressing

## 2019-09-11 NOTE — Evaluation (Signed)
Occupational Therapy Assessment and Plan  Patient Details  Name: Haley Mayo MRN: [id number] Date of Birth: Dec 05, 1950  OT Diagnosis: abnormal posture, altered mental status, apraxia, blindness and low vision, cognitive deficits, muscular wasting and disuse atrophy and muscle weakness (generalized) Rehab Potential:   ELOS: 2 weeks   Today's Date: 09/11/2019 OT Individual Time: 1345-1445 OT Individual Time Calculation (min): 60 min     Problem List:  Patient Active Problem List   Diagnosis Date Noted  . SAH (subarachnoid hemorrhage) ([place]) 09/10/2019  . Pressure injury of skin 09/05/2019  . Subarachnoid hemorrhage ([place]) 08/30/2019  . CAP (community acquired pneumonia) 08/27/2019  . Community acquired pneumonia 08/26/2019  . Elevated CK 08/26/2019  . Generalized weakness 08/26/2019  . Healthcare maintenance 02/02/2011  . Smoking 02/02/2011  . 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 Hyperlipemia 01/27/2010  . LEUKOCYTOSIS 01/27/2010  . Essential hypertension, benign 10/04/2009    Past Medical History:  Past Medical History:  Diagnosis Date  . HLD (hyperlipidemia)   . HTN (hypertension)    controlled  . Leukocytosis    ?   Marland Kitchen Neoplasm of ear    left ear lobe  . Obesity   . Wears dentures    upper   Past Surgical History:  Past Surgical History:  Procedure Laterality Date  . ABDOMINAL HYSTERECTOMY  Age 45  . COLONOSCOPY  09/2004   normal, rpt 10 yrs  . EAR CYST EXCISION Left 09/27/2017   Procedure: EXCISION EAR LOBE CYST;  Surgeon: Beverly Gust, MD;  Location: [facility];  Service: ENT;  Laterality: Left;  LOCAL ONLY  . IR ANGIO VERTEBRAL SEL VERTEBRAL UNI L MOD SED  09/01/2019  . IR ANGIOGRAM FOLLOW UP STUDY  09/01/2019  . IR ANGIOGRAM FOLLOW UP STUDY  09/01/2019  . IR ANGIOGRAM FOLLOW UP STUDY  09/01/2019  . IR ANGIOGRAM FOLLOW UP STUDY  09/01/2019  . IR ANGIOGRAM FOLLOW UP STUDY  09/01/2019  . IR IVC FILTER PLMT / S&I /IMG GUID/MOD SED  09/02/2019  . IR NEURO  EACH ADD'L AFTER BASIC UNI LEFT (MS)  09/01/2019  . IR TRANSCATH/EMBOLIZ  09/01/2019  . OOPHORECTOMY    . RADIOLOGY WITH ANESTHESIA N/A 09/01/2019   Procedure: IR WITH ANESTHESIA;  Surgeon: Consuella Lose, MD;  Location: [facility];  Service: Radiology;  Laterality: N/A;  . TOTAL VAGINAL HYSTERECTOMY  05/2001   heavy bleeding, fibroids    Assessment & Plan Clinical Impression: Patient is a 70 y.o. year old right handed female with recent hospitalization with history of hypertension as well as hyperlipidemia and tobacco abuse.  Per chart review lives alone independent prior to admission.  1 level home 4 steps to entry.  She has 2 children 1 lives in [place] 1 lives in [place]. Son is to provide assist on discharge.  [facility] to [facility] after being found down for an extended time.  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  EEG completed showing no seizure.  She underwent IVC filter placement 09/02/2019 per interventional radiology for pulmonary emboli.  She did remain on low-dose subcutaneous heparin for DVT prophylaxis.  Aspirin and Plavix later initiated after embolization.  Blood pressure controlled with Nimotop.  Her diet was slowly advanced to a dysphagia #3 thin liquid diet with alternative means of nutrition for nutritional support.  There was some documentation on urinary retention placed on  low-dose Urecholine.  Therapy evaluations completed and patient was admitted for a comprehensive rehab program..  Patient transferred to CIR on 09/10/2019 .    Patient currently requires mod with basic self-care skills secondary to muscle weakness, decreased cardiorespiratoy endurance, impaired timing and sequencing, motor apraxia and decreased motor planning, decreased midline orientation and decreased motor planning, decreased initiation,  decreased attention, decreased awareness, decreased problem solving, decreased safety awareness, decreased memory and delayed processing and decreased sitting balance, decreased standing balance, decreased postural control and decreased balance strategies.  Prior to hospitalization, patient could complete ADLs and IADLs with independent .  Patient will benefit from skilled intervention to decrease level of assist with basic self-care skills and increase independence with basic self-care skills prior to discharge home with care partner.  Anticipate patient will require 24 hour supervision and follow up home health.  OT - End of Session Activity Tolerance: Decreased this session Endurance Deficit: Yes Endurance Deficit Description: decreased endurance with ADL routine/shower level bathing OT Assessment OT Barriers to Discharge: Lack of/limited family support;Behavior OT Barriers to Discharge Comments: no caregivers at this time but son is planning to come to stay OT Patient demonstrates impairments in the following area(s): Balance;Behavior;Cognition;Endurance;Motor;Perception;Safety;Vision OT Basic ADL's Functional Problem(s): Grooming;Bathing;Dressing;Toileting OT Advanced ADL's Functional Problem(s): Simple Meal Preparation;Light Housekeeping;Laundry OT Transfers Functional Problem(s): Toilet;Tub/Shower OT Plan OT Intensity: Minimum of 1-2 x/day, 45 to 90 minutes OT Frequency: 5 out of 7 days OT Duration/Estimated Length of Stay: 2 weeks OT Treatment/Interventions: Balance/vestibular training;Discharge planning;Self Care/advanced ADL retraining;Therapeutic Activities;UE/LE Coordination activities;Cognitive remediation/compensation;Functional mobility training;Patient/family education;Therapeutic Exercise;Visual/perceptual remediation/compensation;Wheelchair propulsion/positioning;UE/LE Strength taining/ROM;Neuromuscular re-education;DME/adaptive equipment instruction;Community reintegration OT  Self Feeding Anticipated Outcome(s): Supervision OT Basic Self-Care Anticipated Outcome(s): Supervision OT Toileting Anticipated Outcome(s): Supervision OT Bathroom Transfers Anticipated Outcome(s): Supervision OT Recommendation Patient destination: Home Follow Up Recommendations: 24 hour supervision/assistance;Home health OT Equipment Recommended: Tub/shower bench;3 in 1 bedside comode   Skilled Therapeutic Intervention Pt greeted at time of session in bed laying on her R side in bed, agreeable to OT eval and session. OT educated pt on BI recovery, CIR, ELOS, and POC. Bed mobility tasks supine to sitting up at EOB with Min A with pt able to initiate after verbal instruction, able to static sit at EOB with no UE support after adjusting to upright sitting. SPT bed > w/c with use of RW with Mod A with therapist assist to guide hips and for safety. Pt expressed need to toilet, set up at raised toilet seat over commode and transferred with Min/Mod A with use of grab bars. Not wearing pants, no clothing management. Ambulated to shower bench with RW with Min A for safety and to fully turn prior to sitting. UB bathing with Min A d/t L lateral lean with fatigue, cues for sequencing/problem solving and attending to task. Pt perseverating on face washing, requiring cues to redirect. LB bathing with Mod A, assist to wash and dry below the knee and buttocks (in standing) with pt able to partially assist but needed help for thoroughness. SPT shower bench > w/c with Mod A to guide hips and pt utilizing grab bar. LB dressing with Mod A, assist to thread brief and pants with pt static standing with therapist assist for balance in order to don over hips. Max A for donning socks with attempts to perform in figure 4 position but  Unable to follow through, but required assist over heels. UB dressing with Mod A with cues for threading into correct hole, sequencing over head and down her  trunk. Pt exhibited L lateral lean  during ADL session d/t fatigue but able to correct with multimodal cuing and CGA. Pt demonstrated language of confusion during conversation and open ended questions. Required clear and concise instructions and cuing throughout session. Pt left up in chair with belt alarm on, call bell in reach, all needs met.   OT Evaluation Precautions/Restrictions  Precautions Precautions: Fall Precaution Comments: impulsive, L lateral lean Restrictions Weight Bearing Restrictions: No   Vital Signs Therapy Vitals Temp: 99.6 F (37.6 C) Temp Source: Oral Pulse Rate: 69 Resp: 18 BP: (!) 112/53 Patient Position (if appropriate): Lying Oxygen Therapy SpO2: 93 % O2 Device: Room Air Pain Pain Assessment Pain Scale: 0-10 Pain Score: 0-No pain Home Living/Prior Functioning Home Living Family/patient expects to be discharged to:: Private residence Living Arrangements: Alone Available Help at Discharge: Family(son will be coming from [place] to help) Type of Home: House Home Access: Stairs to enter [employer] of Steps: 4 Entrance Stairs-Rails: Can reach both Home Layout: One level Bathroom Shower/Tub: Chiropodist: Standard Additional Comments: Pt lives alone, but son coming from [place] to assist  Lives With: Alone IADL History Homemaking Responsibilities: Yes Meal Prep Responsibility: Primary Laundry Responsibility: Primary Cleaning Responsibility: Primary Bill Paying/Finance Responsibility: Primary Prior Function Level of Independence: Independent with transfers, Independent with basic ADLs  Able to Take Stairs?: Reciprically Vocation: Unemployed Comments: pt does not drive, uses [employer] transportation for groceries/doctors appointments. Otherwise indep for all ADL/IADL management. Retired from [employer] Baseline Vision/History: Wears glasses Wears Glasses: At all times Patient Visual Report: Diplopia(reports of diplopia but vision functional at  eval with glasses) Vision Assessment?: Vision impaired- to be further tested in functional context(reports of diplopia but vision functional at time of eval in functional tasks with glasses) Perception  Perception: Impaired Praxis   Cognition Overall Cognitive Status: Impaired/Different from baseline Arousal/Alertness: Awake/alert Orientation Level: Person;Place Year: 2021 Month: May Day of Week: Incorrect Memory: Impaired(memory decreased during functional self care tasks but did well with BIMS) Memory Impairment: Decreased recall of new information;Decreased short term memory Decreased Short Term Memory: Functional basic;Verbal basic Immediate Memory Recall: Sock;Blue;Bed Memory Recall Sock: Without Cue Memory Recall Blue: Without Cue Memory Recall Bed: Without Cue Attention: Focused;Sustained Focused Attention: Appears intact Focused Attention Impairment: Verbal complex;Functional complex Sustained Attention: Impaired Sustained Attention Impairment: Verbal basic;Functional basic Selective Attention: Impaired Selective Attention Impairment: Verbal basic;Functional basic Awareness: Impaired Awareness Impairment: Intellectual impairment Problem Solving: Impaired Problem Solving Impairment: Verbal basic;Functional basic Behaviors: Perseveration(perseverates on fash washing and topics such as work, Psychiatrist, Social research officer, government.) Safety/Judgment: Impaired [employer] Scales of Cognitive Functioning: Confused/appropriate Sensation Sensation Light Touch: Appears Intact Proprioception: Impaired by gross assessment Coordination Gross Motor Movements are Fluid and Coordinated: No Fine Motor Movements are Fluid and Coordinated: No Heel Shin Test: decreased ability to perform right to left shin. Motor  Motor Motor: Abnormal postural alignment and control;Motor apraxia;Motor perseverations Mobility  Bed Mobility Bed Mobility: Supine to Sit;Sitting - Scoot to Edge of Bed Supine to Sit: Minimal  Assistance - Patient > 75% Sitting - Scoot to Edge of Bed: Minimal Assistance - Patient > 75% Transfers Sit to Stand: Moderate Assistance - Patient 50-74% Stand to Sit: Moderate Assistance - Patient 50-74%  Trunk/Postural Assessment  Thoracic Assessment Thoracic Assessment: Exceptions to WFL(forward head and shoulders with fatigue) Lumbar Assessment Lumbar Assessment: Exceptions to WFL(flexed posture with fatigue) Postural Control Postural Control: Deficits on evaluation  Balance Balance Balance Assessed: Yes Dynamic Sitting Balance Sitting  balance - Comments: verbal cues to correct, L lateral lean present with fatigue during functional tasks Extremity/Trunk Assessment RUE Assessment RUE Assessment: Exceptions to [facility] General Strength Comments: ROM WFL but decreased strength, 4-/5 RUE Body System: Neuro LUE Assessment LUE Assessment: Exceptions to [facility] General Strength Comments: ROM WFL, decreased strength 4-/5 LUE Body System: Neuro    Refer to Care Plan for Long Term Goals  Recommendations for other services: None    Discharge Criteria: Patient will be discharged from OT if patient refuses treatment 3 consecutive times without medical reason, if treatment goals not met, if there is a change in medical status, if patient makes no progress towards goals or if patient is discharged from hospital.  The above assessment, treatment plan, treatment alternatives and goals were discussed and mutually agreed upon: by patient  Viona Gilmore 09/11/2019, 3:43 PM

## 2019-09-11 NOTE — Evaluation (Signed)
Physical Therapy Assessment and Plan  Patient Details  Name: Haley Mayo MRN: [id number] Date of Birth: 18-Sep-1950  PT Diagnosis: Abnormal posture, Abnormality of gait, Ataxic gait, Coordination disorder, Difficulty walking and Impaired cognition Rehab Potential: Good ELOS: 2-3 weeks   Today's Date: 09/11/2019 PT Individual Time: 7654-6503 PT Individual Time Calculation (min): 73 min    Problem List:  Patient Active Problem List   Diagnosis Date Noted  . SAH (subarachnoid hemorrhage) ([place]) 09/10/2019  . Pressure injury of skin 09/05/2019  . Subarachnoid hemorrhage ([place]) 08/30/2019  . CAP (community acquired pneumonia) 08/27/2019  . Community acquired pneumonia 08/26/2019  . Elevated CK 08/26/2019  . Generalized weakness 08/26/2019  . Healthcare maintenance 02/02/2011  . Smoking 02/02/2011  . 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 Hyperlipemia 01/27/2010  . LEUKOCYTOSIS 01/27/2010  . Essential hypertension, benign 10/04/2009    Past Medical History:  Past Medical History:  Diagnosis Date  . HLD (hyperlipidemia)   . HTN (hypertension)    controlled  . Leukocytosis    ?   Marland Kitchen Neoplasm of ear    left ear lobe  . Obesity   . Wears dentures    upper   Past Surgical History:  Past Surgical History:  Procedure Laterality Date  . ABDOMINAL HYSTERECTOMY  Age 20  . COLONOSCOPY  09/2004   normal, rpt 10 yrs  . EAR CYST EXCISION Left 09/27/2017   Procedure: EXCISION EAR LOBE CYST;  Surgeon: Beverly Gust, MD;  Location: [facility];  Service: ENT;  Laterality: Left;  LOCAL ONLY  . IR ANGIO VERTEBRAL SEL VERTEBRAL UNI L MOD SED  09/01/2019  . IR ANGIOGRAM FOLLOW UP STUDY  09/01/2019  . IR ANGIOGRAM FOLLOW UP STUDY  09/01/2019  . IR ANGIOGRAM FOLLOW UP STUDY  09/01/2019  . IR ANGIOGRAM FOLLOW UP STUDY  09/01/2019  . IR ANGIOGRAM FOLLOW UP STUDY  09/01/2019  . IR IVC FILTER PLMT / S&I /IMG GUID/MOD SED  09/02/2019  . IR NEURO EACH ADD'L AFTER BASIC UNI LEFT (MS)  09/01/2019   . IR TRANSCATH/EMBOLIZ  09/01/2019  . OOPHORECTOMY    . RADIOLOGY WITH ANESTHESIA N/A 09/01/2019   Procedure: IR WITH ANESTHESIA;  Surgeon: Consuella Lose, MD;  Location: [place];  Service: Radiology;  Laterality: N/A;  . TOTAL VAGINAL HYSTERECTOMY  05/2001   heavy bleeding, fibroids    Assessment & Plan Clinical Impression: Haley Mayo is a 69 year old right-handed female with history of hypertension as well as hyperlipidemia and tobacco abuse.  Per chart review lives alone independent prior to admission.  1 level home 4 steps to entry.  She has 2 children 1 lives in [place] 1 lives in [place].Son is to provide assist on discharge.  Presented 08/26/2019 to [facility] after being found down for an extended time.  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  CT angiogram of the chest showed a filling defect within the proximal right upper lobe apical and posterior segmental arteries consistent with pulmonary embolus.  Moderate cardiomegaly.  There was also a 2.3 cm right thyroid heterogeneous nodule.  Venous Doppler studies lower extremity showed no DVT.  Admission chemistries showed a WBC of 27,100, hemoglobin 15.4, CK 3162, lactic acid  1.2, glucose 166, BUN 28, creatinine 0.81, troponin 23, SARS coronavirus negative.  Chest x-ray showed mild basilar atelectasis and/or infiltrate.  Placed on broad-spectrum antibiotics.  She was transferred to [facility] 08/30/2019 for ongoing follow-up and care.  Underwent diagnostic cerebral angiogram stent support coil embolization of basilar aneurysm 09/01/2019 per Dr. Kathyrn Sheriff.  EEG completed showing no seizure.  She underwent IVC filter placement 09/02/2019 per  interventional radiology for pulmonary emboli.  She did remain on low-dose subcutaneous heparin for DVT prophylaxis.  Aspirin and Plavix later initiated after embolization.  Blood pressure controlled with Nimotop.  Her diet was slowly advanced to a dysphagia #3 thin liquid diet with alternative means of nutrition for nutritional support.  There was some documentation on urinary retention placed on low-dose Urecholine.  Therapy evaluations completed and patient was admitted for a comprehensive rehab program.    Patient currently requires mod with mobility secondary to motor apraxia, decreased coordination and decreased motor planning and decreased standing balance, decreased postural control and decreased balance strategies.  Prior to hospitalization, patient was independent  with mobility and lived with   in a House home.  Home access is 4Stairs to enter.  Patient will benefit from skilled PT intervention to maximize safe functional mobility, minimize fall risk and decrease caregiver burden for planned discharge home with intermittent assist.  Anticipate patient will benefit from follow up [facility] at discharge.  PT - End of Session Activity Tolerance: Tolerates 30+ min activity with multiple rests Endurance Deficit: Yes PT Assessment Rehab Potential (ACUTE/IP ONLY): Good PT Barriers to Discharge: Lack of/limited family support;Other (comments) PT Barriers to Discharge Comments: safety concerns PT Patient demonstrates impairments in the following area(s): Balance;Safety;Endurance;Motor PT Transfers Functional Problem(s): Bed Mobility;Bed to Chair;Car;Furniture PT Locomotion Functional Problem(s): Ambulation;Wheelchair Mobility;Stairs PT Plan PT Intensity: Minimum of 1-2 x/day ,45 to 90 minutes PT Frequency: 5 out of 7 days PT Duration Estimated Length of Stay: 2-3 weeks PT Treatment/Interventions: Ambulation/gait training;Discharge planning;DME/adaptive equipment instruction;Functional mobility  training;Therapeutic Activities;UE/LE Strength taining/ROM;Balance/vestibular training;Community reintegration;Neuromuscular re-education;Patient/family education;Stair training;Therapeutic Exercise;Wheelchair propulsion/positioning;UE/LE Coordination activities PT Transfers Anticipated Outcome(s): supervision PT Locomotion Anticipated Outcome(s): supervision PT Recommendation Patient destination: Home Equipment Recommended: To be determined Equipment Details: Pt has no equipment PTA, although uses some kind of chair in tub/shower.  Skilled Therapeutic Intervention Evaluation completed (see details above and below) with education on PT POC and goals and individual treatment initiated with focus on safety, transfers, gait, balance.  Pt presents supine in bed and agreeable to therapy, stating need to use BR.  Pt required mod A for sup to sit and then sit to stand.  Pt amb approx. 10' into BR w/ RW and mod A max verbal cues for posture and maintaining position inside of RW.  Visual cues given as well, but then falls back especially as enters BR on sloped surface.  Pt amb 5' to w/c w/ mod A and verbal cues.  Pt able to assist w/ donning scrub pants and sit to stand w/ max A to pull up pants over hips.  Pt removed gown after loosening tie and donned scrub top w/ min A.  Pt wheeled to gym and amb to car w/  od A, but improved step length and posture.  Pt required mod A and max verbal cueing for safe transition to car.  Pt negotiated 4 steps w/ Bilateral rails and mod A.  Pt performed reciprocal gait ascending and step-to pattern descending, although explained step-to pattern for ascension.  Pt negotiated w/c up  to 34' w/ bilateral UEs w/ mod A for avoiding obstacles and for list to right.  Pt returned to room and chair alarm placed w/ needs in place.  Nursing in for care.  PT Evaluation Precautions/Restrictions Precautions Precautions: Fall Precaution Comments: impulsive Restrictions Weight Bearing  Restrictions: No General Chart Reviewed: Yes Family/Caregiver Present: No Vital SignsTherapy Vitals Pulse Rate: 71 BP: 105/62 Pain Pain Assessment Pain Scale: 0-10 Pain Score: 0-No pain Home Living/Prior Functioning Home Living Available Help at Discharge: Other (Comment)(unknown) Type of Home: House Home Access: Stairs to enter [employer] of Steps: 4 Entrance Stairs-Rails: Can reach both Home Layout: One level Bathroom Shower/Tub: Chiropodist: Standard Additional Comments: Pt reports she lives alone, and has limited support at discharge  Prior Function Level of Independence: Independent with gait;Independent with transfers  Able to Take Stairs?: Reciprically Vocation: Unemployed Comments: pt does not drive, uses [employer] transportation for groceries/doctors appointments. Otherwise indep for all ADL/IADL management. Retired from [employer] Vision/Perception     Cognition Overall Cognitive Status: Impaired/Different from baseline Arousal/Alertness: Awake/alert Orientation Level: Disoriented to place;Oriented to person;Oriented to situation Attention: Focused;Sustained Focused Attention: Impaired Focused Attention Impairment: Verbal complex Sustained Attention: Impaired Sustained Attention Impairment: Verbal basic Memory Impairment: Decreased recall of new information Sensation Sensation Light Touch: Appears Intact Coordination Gross Motor Movements are Fluid and Coordinated: No Fine Motor Movements are Fluid and Coordinated: No Heel Shin Test: decreased ability to perform right to left shin. Motor  Motor Motor: Abnormal postural alignment and control;Motor apraxia  Mobility Bed Mobility Bed Mobility: Supine to Sit Supine to Sit: Moderate Assistance - Patient 50-74% Transfers Transfers: Sit to Stand;Stand to Sit;Stand Pivot Transfers Sit to Stand: Moderate Assistance - Patient 50-74% Stand to Sit: Moderate Assistance - Patient  50-74% Stand Pivot Transfers: Moderate Assistance - Patient 50 - 74% Stand Pivot Transfer Details: Verbal cues for technique;Verbal cues for precautions/safety Stand Pivot Transfer Details (indicate cue type and reason): for hand placement Transfer (Assistive device): Rolling walker Locomotion  Gait Ambulation: Yes Gait Assistance: Moderate Assistance - Patient 50-74% Gait Distance (Feet): 10 Feet Assistive device: Rolling walker Gait Assistance Details: Verbal cues for gait pattern;Tactile cues for posture;Visual cues/gestures for precautions/safety;Verbal cues for precautions/safety Gait Assistance Details: flexed posture Gait Gait: Yes Gait Pattern: Impaired Gait Pattern: Decreased step length - left;Decreased step length - right;Trunk flexed;Wide base of support;Poor foot clearance - right;Poor foot clearance - left Gait velocity: reduced Stairs / Additional Locomotion Stairs: Yes Stairs Assistance: Moderate Assistance - Patient 50 - 74% Stair Management Technique: Two rails;Alternating pattern(alternating ascendig, step-to descending) Number of Stairs: 4 Height of Stairs: 8 Wheelchair Mobility Wheelchair Mobility: Yes Wheelchair Assistance: Moderate Assistance - Patient 50 - 74% Wheelchair Propulsion: Both upper extremities Wheelchair Parts Management: Needs assistance Distance: 50  Trunk/Postural Assessment  Thoracic Assessment Thoracic Assessment: Exceptions to WFL(rounded shoulders) Lumbar Assessment Lumbar Assessment: Exceptions to WFL(flexed posture) Postural Control Postural Control: Deficits on evaluation  Balance   Extremity Assessment      RLE Assessment RLE Assessment: Within Functional Limits General Strength Comments: grossly at least 4/5 LLE Assessment LLE Assessment: Within Functional Limits General Strength Comments: grossly at least 4/5    Refer to Care Plan for Long Term Goals  Recommendations for other services: None   Discharge Criteria:  Patient will be discharged from PT if patient refuses treatment 3 consecutive times without medical reason, if treatment goals not met, if there is a change in medical status, if patient makes no progress towards goals or  if patient is discharged from hospital.  The above assessment, treatment plan, treatment alternatives and goals were discussed and mutually agreed upon: by patient  [name] 09/11/2019, 12:36 PM

## 2019-09-11 NOTE — Evaluation (Signed)
Speech Language Pathology Assessment and Plan  Patient Details  Name: Haley Mayo MRN: [id number] Date of Birth: December 08, 1950  SLP Diagnosis: Cognitive Impairments;Dysphagia  Rehab Potential: Good ELOS: 2-2.5 weeks    Today's Date: 09/11/2019 SLP Individual Time: 0820-0915 SLP Individual Time Calculation (min): 55 min   Problem List:  Patient Active Problem List   Diagnosis Date Noted  . SAH (subarachnoid hemorrhage) ([place]) 09/10/2019  . Pressure injury of skin 09/05/2019  . Subarachnoid hemorrhage ([place]) 08/30/2019  . CAP (community acquired pneumonia) 08/27/2019  . Community acquired pneumonia 08/26/2019  . Elevated CK 08/26/2019  . Generalized weakness 08/26/2019  . Healthcare maintenance 02/02/2011  . Smoking 02/02/2011  . 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 Hyperlipemia 01/27/2010  . LEUKOCYTOSIS 01/27/2010  . Essential hypertension, benign 10/04/2009   Past Medical History:  Past Medical History:  Diagnosis Date  . HLD (hyperlipidemia)   . HTN (hypertension)    controlled  . Leukocytosis    ?   Marland Kitchen Neoplasm of ear    left ear lobe  . Obesity   . Wears dentures    upper   Past Surgical History:  Past Surgical History:  Procedure Laterality Date  . ABDOMINAL HYSTERECTOMY  Age 14  . COLONOSCOPY  09/2004   normal, rpt 10 yrs  . EAR CYST EXCISION Left 09/27/2017   Procedure: EXCISION EAR LOBE CYST;  Surgeon: Beverly Gust, MD;  Location: [place];  Service: ENT;  Laterality: Left;  LOCAL ONLY  . IR ANGIO VERTEBRAL SEL VERTEBRAL UNI L MOD SED  09/01/2019  . IR ANGIOGRAM FOLLOW UP STUDY  09/01/2019  . IR ANGIOGRAM FOLLOW UP STUDY  09/01/2019  . IR ANGIOGRAM FOLLOW UP STUDY  09/01/2019  . IR ANGIOGRAM FOLLOW UP STUDY  09/01/2019  . IR ANGIOGRAM FOLLOW UP STUDY  09/01/2019  . IR IVC FILTER PLMT / S&I /IMG GUID/MOD SED  09/02/2019  . IR NEURO EACH ADD'L AFTER BASIC UNI LEFT (MS)  09/01/2019  . IR TRANSCATH/EMBOLIZ  09/01/2019  . OOPHORECTOMY    . RADIOLOGY  WITH ANESTHESIA N/A 09/01/2019   Procedure: IR WITH ANESTHESIA;  Surgeon: Consuella Lose, MD;  Location: [place];  Service: Radiology;  Laterality: N/A;  . TOTAL VAGINAL HYSTERECTOMY  05/2001   heavy bleeding, fibroids    Assessment / Plan / Recommendation Clinical Impression Patient is a 69 year old right-handed female with history of hypertension as well as hyperlipidemia and tobacco abuse. Presented [facility]/26/[facility] to [facility] after being found down for an extended time (? 10 days). 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 CT angiogram of the chest showed a filling defect within the proximal right upper lobe apical and posterior segmental arteries consistent with pulmonary embolus. Moderate cardiomegaly. There was also a 2.3 cm right thyroid heterogeneous nodule. Chest x-ray showed mild basilar atelectasis and/or infiltrate. Placed on broad-spectrum antibiotics. She was transferred to [facility] 08/30/2019 for ongoing follow-up and care. Underwent diagnostic cerebral angiogram stent support coil embolization of basilar aneurysm 09/01/2019 per Dr. Kathyrn Sheriff. EEG completed showing no seizure. She underwent IVC filter placement 09/02/2019 per interventional radiology for pulmonary emboli. Her diet was slowly advanced to a dysphagia 3 textures withthin liquids. Therapy evaluations completed and patient was  recommended for a comprehensive rehab program. Patient admitted 09/10/19.  Patient was administered the Cognistat and scored WFL on all subtests with the exception of short-term memory in which she demonstrated severe impairments. However, during informal assessment, patient demonstrated deficits  in sustained attention, functional problem solving, intellectual awareness, short-term recall, and topic maintenance with perseveration of specific topics. Patient consumed thin liquids via straw without overt s/s of aspiration and demonstrated mildly prolonged mastication with decreased awareness of bolus due to cognitive deficits with solid textures. Recommend patient remain on Dys. 3 textures and thin liquids. Patient would benefit from skilled SLP intervention to maximize her cognitive and swallowing function prior to discharge.     Skilled Therapeutic Interventions          Administered a cognitive-linguistic evaluation and BSE, please above for details.   SLP Assessment  Patient will need skilled Speech Lanaguage Pathology Services during CIR admission    Recommendations  SLP Diet Recommendations: Dysphagia 3 (Mech soft);Thin Liquid Administration via: Cup;Straw Medication Administration: Whole meds with puree Supervision: Patient able to self feed Compensations: Slow rate;Small sips/bites;Minimize environmental distractions Postural Changes and/or Swallow Maneuvers: Seated upright 90 degrees Oral Care Recommendations: Oral care BID Recommendations for Other Services: Neuropsych consult Patient destination: Home Follow up Recommendations: 24 hour supervision/assistance;Home Health SLP Equipment Recommended: None recommended by SLP    SLP Frequency 3 to 5 out of 7 days   SLP Duration  SLP Intensity  SLP Treatment/Interventions 2-2.5 weeks  Minumum of 1-2 x/day, 30 to 90 minutes  Cognitive remediation/compensation;Dysphagia/aspiration precaution training;Internal/external aids;Therapeutic Activities;Environmental controls;Cueing hierarchy;Functional tasks;Patient/family education    Pain Pain Assessment Pain Scale: 0-10 Pain Score: 0-No pain  Prior Functioning Type of Home: House Available Help at Discharge: Other (Comment)(unknown) Vocation: Unemployed  SLP  Evaluation Cognition Overall Cognitive Status: Impaired/Different from baseline Arousal/Alertness: Awake/alert Orientation Level: Oriented X4 Attention: Focused;Sustained Focused Attention: Appears intact Focused Attention Impairment: Verbal complex Sustained Attention: Impaired Sustained Attention Impairment: Verbal basic;Functional basic Selective Attention: Impaired Selective Attention Impairment: Verbal basic;Functional basic Memory: Impaired Memory Impairment: Decreased recall of new information;Decreased short term memory Decreased Short Term Memory: Functional basic;Verbal basic Awareness: Impaired Awareness Impairment: Intellectual impairment Problem Solving: Impaired Problem Solving Impairment: Verbal basic;Functional basic Behaviors: Perseveration(perseveration on topics) Safety/Judgment: Impaired  Comprehension Auditory Comprehension Overall Auditory Comprehension: Impaired Yes/No Questions: Within Functional Limits Commands: Within Functional Limits Conversation: Simple Interfering Components: Processing speed;Attention EffectiveTechniques: Extra processing time;Repetition Visual Recognition/Discrimination Discrimination: Not tested Reading Comprehension Reading Status: Not tested Expression Expression Primary Mode of Expression: Verbal Verbal Expression Overall Verbal Expression: Appears within functional limits for tasks assessed Written Expression Dominant Hand: Right Written Expression: Not tested Oral Motor Oral Motor/Sensory Function Overall Oral Motor/Sensory Function: Within functional limits Motor Speech Overall Motor Speech: Appears within functional limits for tasks assessed   Bedside Swallowing Assessment General Date of Onset: 08/26/19 Previous Swallow Assessment: BSE 5/21: Recommended Dys. 2 textures with thin liquids but has since been upgraded to Dys. 3 textures Diet Prior to this Study: Dysphagia 3 (soft);Thin liquids Temperature  Spikes Noted: No Respiratory Status: Room air History of Recent Intubation: No Behavior/Cognition: Alert;Cooperative;Confused;Distractible;Requires cueing Oral Cavity - Dentition: Missing dentition Self-Feeding Abilities: Able to feed self;Needs assist Vision: Functional for self-feeding Baseline Vocal Quality: Normal Volitional Cough: Strong Volitional Swallow: Able to elicit  Ice Chips Ice chips: Not tested Thin Liquid Thin Liquid: Within functional limits Presentation: Straw Nectar Thick Nectar Thick Liquid: Not tested Honey Thick Honey Thick Liquid: Not tested Puree Puree: Within functional limits Presentation: Spoon;Self Fed  Solid Solid: Impaired Other Comments: decreased attention to bolus BSE Assessment Risk for Aspiration Impact on safety and function: Mild aspiration risk Other Related Risk Factors: Cognitive impairment  Short Term Goals: Week 1: SLP Short Term Goal 1 (Week 1): Patient will demonstrate functional problem solving for basic and familiar tasks with Mod verbal cues. SLP Short Term Goal 2 (Week 1): Patient will utilize external aids to recall new, daily information with Mod verbal and visual cues. SLP Short Term Goal 3 (Week 1): Patient will demonstrate sustained attention to functional tasks for 15 minutes with Mod verbal cues for redirection. SLP Short Term Goal 4 (Week 1): Patient will demonstrate topic maintenance for ~3 turns with Mod verbal cues for redirection SLP Short Term Goal 5 (Week 1): Patient will demonstrate emergent awareness of errors during functional tasks with Mod verbal cues. SLP Short Term Goal 6 (Week 1): Patient will demonstrate efficient mastication and complete oral clearance without overt s/s of aspiration with trials of regular textures with supervision verbal cues over 2 sessions.  Refer to Care Plan for Long Term Goals  Recommendations for other services: Neuropsych  Discharge Criteria: Patient will be discharged from SLP if  patient refuses treatment 3 consecutive times without medical reason, if treatment goals not met, if there is a change in medical status, if patient makes no progress towards goals or if patient is discharged from hospital.  The above assessment, treatment plan, treatment alternatives and goals were discussed and mutually agreed upon: by patient  PAYNE, COURTNEY 09/11/2019, 3:22 PM

## 2019-09-11 NOTE — Discharge Summary (Signed)
Physician Discharge Summary  Patient ID: JAZMYNN RIPPE MRN: [id number] DOB/AGE: [birth year] 69 y.o.  Admit date: 08/31/2019 Discharge date: 09/11/2019  Admission Diagnoses:  Mutual Vasospasm Pulmonary embolism  Discharge Diagnoses:  Same Active Problems:   Subarachnoid hemorrhage ([place])   Pressure injury of skin   Discharged Condition: Stable  Hospital Course:  Haley Mayo is a 69 y.o. female who was initially brought to [facility] 5/12 after being found down he is of unknown length of time.  She was diagnosed with community-acquired pneumonia.  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  She was transferred to [facility] for further workup and management.  She underwent diagnostic cerebral angiogram in ultimate stent coiling of the fusiform basilar aneurysm on 05/18.  Her postop course was complicated by vasospasms for which she was started on [facility] therapy and responded well clinically.  Also noted in initial workup was diagnosis of a pulmonary embolism.  Due to the subarachnoid hemorrhage, anticoagulation was not in her best interest.  She therefore underwent IVC filter placement on 05/19.  She continued to progress although remained confused at times.  She worked with both Physical and Occupational therapy.  [facility]  She was discharged on 5 27 in hemodynamically stable condition.  Treatments: Surgery 5/18 Stent and coiling of fusiform basilar aneurysm. 5/19 - IVC filter placement  Discharge Exam: Blood pressure 118/76, pulse 86, temperature 98.3 F (36.8 C), temperature source Oral, resp. rate (!) 21, weight 73.2 kg, SpO2 100 %. Awake, alert, oriented Speech fluent, appropriate CN grossly intact MAEW  Disposition: Discharge disposition: [place] Not Defined       Discharge Instructions    Call MD for:  difficulty breathing, headache or visual disturbances   Complete by: As directed    Call MD for:  persistant dizziness or light-headedness   Complete by: As directed    Call MD for:  redness, tenderness, or signs of infection (pain, swelling, redness, odor or green/yellow discharge around incision site)   Complete by: As directed    Call MD for:  severe uncontrolled pain   Complete by: As directed    Call MD for:  temperature >100.4   Complete by: As directed    Diet general   Complete by: As directed    Driving Restrictions   Complete by: As directed    Do not drive until given clearance.   Increase activity slowly   Complete by: As directed    Lifting restrictions   Complete by: As directed    Do not lift anything >10lbs. Avoid bending and twisting in awkward positions. Avoid bending at the back.   May shower / Bathe   Complete by: As directed    In 24 hours. Okay to wash wound with warm soapy water. Avoid scrubbing the wound. Pat dry.   Remove dressing in 24 hours   Complete by: As directed      Allergies as of 09/10/2019   No Known Allergies     Medication List    STOP taking these medications   amLODipine 10 MG tablet Commonly known as: NORVASC   benazepril 20 MG tablet Commonly known as: LOTENSIN   ceFEPIme 2 g in sodium chloride 0.9 % 100 mL   enoxaparin 40 MG/0.4ML injection Commonly  known as: LOVENOX   polyethylene glycol 17 g packet Commonly known as: MIRALAX / GLYCOLAX   vancomycin 1250 MG/250ML Soln Commonly known as: VANCOREADY     TAKE these medications   acetaminophen 325 MG tablet Commonly known as: TYLENOL Take 2 tablets (650 mg total) by mouth every 6 (six) hours as needed for mild pain, fever or headache.   amLODipine-benazepril 10-20 MG capsule Commonly known as: LOTREL Take 1 capsule by mouth every evening.   aspirin 325 MG tablet Take 1 tablet (325 mg  total) by mouth daily.   clopidogrel 75 MG tablet Commonly known as: PLAVIX Take 1 tablet (75 mg total) by mouth daily.   EMERGEN-C VITAMIN C PO Take 1 tablet by mouth daily.   Healthy Heart Emul Take 1 capsule by mouth daily.   niMODipine 30 MG capsule Commonly known as: NIMOTOP Take 2 capsules (60 mg total) by mouth every 4 (four) hours for 21 days.      Follow-up Information    Consuella Lose, MD Follow up.   Specialty: Neurosurgery Contact information: [address] [place] [address] [phone]           Signed: Traci Sermon 09/11/2019, 8:04 AM

## 2019-09-11 NOTE — Progress Notes (Addendum)
[facility] PHYSICAL MEDICINE & REHABILITATION PROGRESS NOTE   Subjective/Complaints: No issues overnite, remains confused , does not follow conversation  Oriented to person and place not time   Review of Systems  Constitutional: Negative for chills and fever.  HENT: Negative for hearing loss.   Eyes: Positive for double vision.  Respiratory: Positive for cough. Negative for shortness of breath.   Cardiovascular: Positive for leg swelling. Negative for chest pain and palpitations.  Gastrointestinal: Positive for constipation. Negative for heartburn, nausea and vomiting.  Genitourinary: Negative for dysuria, flank pain and hematuria.  Musculoskeletal: Positive for joint pain and myalgias.  Neurological: Positive for dizziness and headaches.  All other systems reviewed and are negative.  Objective:   VAS [place] TRANSCRANIAL DOPPLER  Result Date: 09/09/2019  Transcranial Doppler Indications: Subarachnoid hemorrhage. Performing Technologist: June Leap RDMS, RVT  Examination Guidelines: A complete evaluation includes B-mode imaging, spectral Doppler, color Doppler, and power Doppler as needed of all accessible portions of each vessel. Bilateral testing is considered an integral part of a complete examination. Limited examinations for reoccurring indications may be performed as noted.  +----------+-------------+----------+-----------+-------+ RIGHT TCD Right VM (cm)Depth (cm)PulsatilityComment +----------+-------------+----------+-----------+-------+ Opthalmic     27.00                 1.76            +----------+-------------+----------+-----------+-------+ ICA siphon    41.00                 1.56            +----------+-------------+----------+-----------+-------+  +----------+------------+----------+-----------+-------+ LEFT TCD  Left VM (cm)Depth (cm)PulsatilityComment +----------+------------+----------+-----------+-------+ Opthalmic    26.00                 1.83             +----------+------------+----------+-----------+-------+ ICA siphon   36.00                 1.23            +----------+------------+----------+-----------+-------+  Summary:  Suboptimal study due to poor bitemporal and occipital windows bilaterally. Antegrade flow in both opthalmic and carotid siphons *See table(s) above for TCD measurements and observations.  Diagnosing physician: Antony Contras MD Electronically signed by Antony Contras MD on 09/09/2019 at 2:48:00 PM.    Final    Recent Labs    09/10/19 0906 09/11/19 0745  WBC 16.8* 14.4*  HGB 11.9* 12.3  HCT 37.0 36.9  PLT 470* 476*   Recent Labs    09/09/19 0723 09/11/19 0745  NA 136 138  K 4.2 4.1  CL 99 101  CO2 25 25  GLUCOSE 133* 108*  BUN 13 18  CREATININE 0.53 0.64  CALCIUM 9.3 10.0    Intake/Output Summary (Last 24 hours) at 09/11/2019 1019 Last data filed at 09/11/2019 0900 Gross per 24 hour  Intake 480 ml  Output 1250 ml  Net -770 ml     Physical Exam: Vital Signs Blood pressure 123/76, pulse 75, temperature 98.4 F (36.9 C), temperature source Oral, resp. rate 17, height 5' (1.524 m), weight 71 kg, SpO2 94 %. There were no vitals taken for this visit. Physical Exam   General: No acute distress Mood and affect are appropriate Heart: Regular rate and rhythm no rubs murmurs or extra sounds Lungs: Clear to auscultation, breathing unlabored, no rales or wheezes Abdomen: Positive bowel sounds, soft nontender to palpation, nondistended Extremities: No clubbing, cyanosis, or edema Skin: No evidence of breakdown, no evidence  of rash Neurologic: Cranial nerves II through XII intact, motor strength is 5/5 in bilateral deltoid, bicep, tricep, grip,4-/5 bilateral  hip flexor, knee extensors, ankle dorsiflexor and plantar flexor Sensory exam normal sensation to light touch and proprioception in bilateral upper and lower extremities Cerebellar exam normal finger to nose to finger as well as heel to shin in  bilateral upper and lower extremities Musculoskeletal: Full range of motion in all 4 extremities. No joint swelling Psychiatric: She has a normal mood and affect.   Assessment/Plan: 1. 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  which require 3+ hours per day of interdisciplinary therapy in a comprehensive inpatient rehab setting.  Physiatrist is providing close team supervision and 24 hour management of active medical problems listed below.  Physiatrist and rehab team continue to assess barriers to discharge/monitor patient progress toward functional and medical goals  Care Tool:  Bathing              Bathing assist       Upper Body Dressing/Undressing Upper body dressing   What is the patient wearing?: Hospital gown only    Upper body assist Assist Level: Minimal Assistance - Patient > 75%    Lower Body Dressing/Undressing Lower body dressing            Lower body assist       Toileting Toileting    Toileting assist Assist for toileting: Dependent - Patient 0%(foley)     Transfers Chair/bed transfer  Transfers assist           Locomotion Ambulation   Ambulation assist              Walk 10 feet activity   Assist           Walk 50 feet activity   Assist           Walk 150 feet activity   Assist           Walk 10 feet on uneven surface  activity   Assist           Wheelchair     Assist               Wheelchair 50 feet with 2 turns activity    Assist            Wheelchair 150 feet activity     Assist          Blood pressure 123/76, pulse 75, temperature 98.4 F (36.9 C), temperature source Oral, resp. rate 17, height 5' (1.524 m), weight 71 kg, SpO2 94 %.  Medical Problem List and Plan: 1.  Decreased functional mobility secondary to subarachnoid hemorrhage right>left status with  basilar aneurysm.  Status post embolization 09/01/2019             -patient may shower             -ELOS/Goals: 2-2.5 weeks- Supervision to Mod I  -Initial CIR evaluations today.  2.  Antithrombotics: -DVT/anticoagulation: Pulmonary emboli.  Status post IVC filter 09/02/2019.  Lower extremity Dopplers negative.  Continue low-dose heparin             -antiplatelet therapy: Aspirin 325 mg and Plavix 75 mg daily 3. Pain Management: Tylenol as needed.  5/28: Has post-SAH headaches. Start Topiramate 25mg  daily.Improved  4. Mood: Provide emotional support             -antipsychotic agents: N/A 5. Neuropsych:  This patient is capable of making decisions on her own behalf. 6. Skin/Wound Care: Routine skin checks 7. Fluids/Electrolytes/Nutrition: Routine INO's with follow-up chemistries  5/28: BMP stable.  8.  Blood pressure.  Continue Nimotop as directed 9.  2.3 cm right thyroid heterogeneous nodule.  Follow-up outpatient 10.  Tobacco abuse.  Counseling 11.  Dysphagia. Dysphagia #3 thin liquids. Follow-up speech therapy 12.  Urinary retention.  Continue low-dose Urecholine.  Check PVR x3- might benefit from flomax- has foley in currently 13. Stage II sacral decub- POA- likes to sit in 1 position- will need foam dressing and barrier cream for now and get her off sacrum 14. Leukocytosis: 5/28: trending downward.    LOS: 1 days A FACE TO FACE EVALUATION WAS PERFORMED  Clide Deutscher Raulkar 09/11/2019, 10:19 AM

## 2019-09-12 ENCOUNTER — Inpatient Hospital Stay (HOSPITAL_COMMUNITY): Payer: Medicare Other | Admitting: Occupational Therapy

## 2019-09-12 ENCOUNTER — Inpatient Hospital Stay (HOSPITAL_COMMUNITY): Payer: Medicare Other | Admitting: Physical Therapy

## 2019-09-12 LAB — GLUCOSE, CAPILLARY
Glucose-Capillary: 103 mg/dL — ABNORMAL HIGH (ref 70–99)
Glucose-Capillary: 87 mg/dL (ref 70–99)
Glucose-Capillary: 98 mg/dL (ref 70–99)
Glucose-Capillary: 225 mg/dL — ABNORMAL HIGH (ref 70–99)

## 2019-09-12 NOTE — Progress Notes (Signed)
No documentation of output after removal of foley on 5/28 at 1100. Pt denies needing to void. Bladder Scan showed 600 in the pts bladder. Pt encouraged to use the toilet. Pt agreed and assisted to the bathroom. Pt not able to void. Pt was educated on the procedure of the In/Out cath. Pt agreed and tolerated the procedure well. Pt continues to be concerned with the cost of the hospital stay, and states : I do not need to take these medications b/c it will cost to much". Pt educated on importance of taking medications. Pt resting in bed at this time. Denies pain

## 2019-09-12 NOTE — Progress Notes (Signed)
Pt has bruising with a hard center to the R side of her neck. Pt c/o pain when palpating or movement of the neck. Pt had previous PICC in this area. Pt states that she is concerned it will get worse.

## 2019-09-12 NOTE — Progress Notes (Signed)
Physical Therapy Session Note  Patient Details  Name: Haley Mayo MRN: [id number] Date of Birth: 22-May-1950  Today's Date: 09/12/2019 PT Individual Time: 0925-1020 PT Individual Time Calculation (min): 55 min   Short Term Goals: Week 1:  PT Short Term Goal 1 (Week 1): Pt will transfer sup to sit w/ min A . PT Short Term Goal 2 (Week 1): Pt will transfer sit to stand and SPT  w/ min A PT Short Term Goal 3 (Week 1): Pt will amb w/ RW and min A up to 50'.  Skilled Therapeutic Interventions/Progress Updates:   Pt in supine, agreeable to therapy and no c/o pain. Pt stating she was having a problem with her breakfast, however she was unable to elaborate further. She stated she was still hungry but did not want what was on her tray. Supine>sit w/ supervision. Donned pants at EOB w/ min assist. Max cues to take BUEs off bed to use to don pants. Pt very fearful of falling but able to maintain static sitting w/ close supervision. Pt initially refusing socks as she thought she already had them on, when in fact she did not. Donned w/ total assist. Stand pivot to w/c w/ min assist w/ verbal and tactile cues for UE placement. Sat up in chair to eat yogurt, pt able to open yogurt and spoon w/ increased time and verbal cues for technique. Pt very verbose and internally distracted, min cues to attend to task. When finished w/ eating, discussed her food preferences and w/ min cues to correctly name food items she was describing, made a list of food preferences for dietary. Total assist w/c transport to/from therapy gym. Worked on Personnel officer w/ RW, ambulated 78' and 20' w/ min assist. Verbal and tactile cues to keep RW closer to her and for upright posture. Pt very fatigued after ambulation. Pt remained verbose and tangential at times throughout session, min-mod cues to attend to task. Pt also very fearful of falling, needed frequent reminders that this therapist will assist her and maintain a safe  environment. Performed NuStep 7 min @ level 1 w/ all extremities for global strengthening and endurance training. Attempted to ambulate back towards her room, however she c/o increased fatigue and requested to sit. Pt bending over at waist, laying head on her walker. Returned to room total assist and pt agreeable to rest in w/c. Ended session in w/c, all needs in reach. Safety alarm belt engaged.   Therapy Documentation Precautions:  Precautions Precautions: Fall Precaution Comments: impulsive, L lateral lean Restrictions Weight Bearing Restrictions: No Vital Signs: Therapy Vitals Pulse Rate: 80 BP: 109/60 Patient Position (if appropriate): Sitting  Therapy/Group: Individual Therapy  Ormand Senn Clent Demark 09/12/2019, 10:27 AM

## 2019-09-12 NOTE — Progress Notes (Signed)
Occupational Therapy Session Note  Patient Details  Name: Haley Mayo MRN: [id number] Date of Birth: January 18, 1951  Today's Date: 09/12/2019 OT Individual Time: [id number] Individual Time Calculation (min): 60 min    Short Term Goals: Week 1:  OT Short Term Goal 1 (Week 1): Pt will perform toilet transfers consistently with Min A OT Short Term Goal 2 (Week 1): Pt will thread BLEs into pants with supervision OT Short Term Goal 3 (Week 1): Pt will sequence bathing 11/11 body parts with min verbal cues OT Short Term Goal 4 (Week 1): Pt will perform clothing management with Min A for dynamic balance during toileting  Skilled Therapeutic Interventions/Progress Updates:    Pt in bed to start session secondary to nursing care.  She was oriented to place when asked but not day of the week or reason for hospitalization.  When asked about her reason for being in the hospital she stated that she didn't want to "play games".  Therapist explained to the pt that he wasn't playing games but working on her memory and ability to recall what had happened to her.  She was unaware of why she was in the hospital.  She was able to transfer to the EOB with min assist and maintain static sitting balance with supervision.  Therapist had her transfer over to the wheelchair with mod assist stand pivot.  She then worked on donning pants at the sink.  When given the scrub pants she draped them over her like a blanket.  When re-directed X 2 that they were pants and she needed to put them on, she would just cross her arms over top of them without any initiation to put them on.  Therapist provided assist with threading them over her feet and then she was able to stand with mod assist to pull them up over her hips.  Had her then complete grooming tasks of brushing her hair and her teeth with setup and min instructional cueing for initiation.  Next, she was taken down to the therapy gym where she worked on sit to stand and  static standing balance.  She needed mod assist to complete standing with increased posterior lean through her trunk and some RLE flexion.  Pt would fatigue after standing for around 1 minute with increased fear of falling reported as well.  Returned to room after brief work on balance secondary to time limitations with pt completing stand pivot transfer back to the bed with mod assist.  She was left with call button and phone in reach and bed alarm in place.  She was able to demonstrate use of the call button when help is needed.  Throughout session pt would start to make statements, but was at times unable to finish them secondary to internal distraction.    Therapy Documentation Precautions:  Precautions Precautions: Fall Precaution Comments: impulsive, L lateral lean Restrictions Weight Bearing Restrictions: No  Pain: Pain Assessment Pain Scale: Faces Pain Score: 0-No pain ADL: See Care Tool Section for some details of ADL performance.     Therapy/Group: Individual Therapy  Rochester Serpe OTR/L 09/12/2019, 5:02 PM

## 2019-09-12 NOTE — Progress Notes (Signed)
Occupational Therapy Session Note  Patient Details  Name: Haley Mayo MRN: [id number] Date of Birth: 1951/02/20  Today's Date: 09/12/2019 OT Individual Time: 1300-1411 OT Individual Time Calculation (min): 71 min   Short Term Goals: Week 1:  OT Short Term Goal 1 (Week 1): Pt will perform toilet transfers consistently with Min A OT Short Term Goal 2 (Week 1): Pt will thread BLEs into pants with supervision OT Short Term Goal 3 (Week 1): Pt will sequence bathing 11/11 body parts with min verbal cues OT Short Term Goal 4 (Week 1): Pt will perform clothing management with Min A for dynamic balance during toileting  Skilled Therapeutic Interventions/Progress Updates:    Pt greeted in bed, finishing up lunch. Note that her drink had the lid on but was overflowing and spilled on her table. Per pt, she added "protein" to her drink. Tried to ask more about what she did but pt was having language of confusion and word finding deficits. Per RN, pt mixed her coffee and hot chocolate together this morning. Pt initially agreeable to take a shower. OT retrieved needed items for the room and then pt completed supine<sit with supervision and increased time due to tangential speech and needing cues for redirection to task. Pt about to complete sit<stand with RW and then reported the "carbs" from lunch were making her sleepy. Per pt "I just need to lie down." She refused any OOB activity afterwards. Tried to set her up with bedlevel grooming tasks however pt refused. Transitioned to focus on functional cognition, pt presented with bin of brightly colored plastic letters and asked to find the letters of her name. Pt required max vcs for attention to task, often c/o fatigue and needed frequent rest breaks. She was able to discriminate colors and correct letters when presented with option of 2, but demonstrated difficultly with figure ground when looking for letters in the bin. OT encouraged Rt hand use however pt  predominantly used the Lt hand to meet demands of task. At end of session pt remained in bed with all needs within reach and bed alarm set, 4 bedrails up per request.   Therapy Documentation Precautions:  Precautions Precautions: Fall Precaution Comments: impulsive, L lateral lean Restrictions Weight Bearing Restrictions: No Pain: Stomach discomfort. Pt declined interventions to address, including attempting toileting   ADL:       Therapy/Group: Individual Therapy  Esaias Cleavenger A Brenly Trawick 09/12/2019, 4:08 PM

## 2019-09-13 ENCOUNTER — Inpatient Hospital Stay (HOSPITAL_COMMUNITY): Payer: Medicare Other

## 2019-09-13 LAB — GLUCOSE, CAPILLARY
Glucose-Capillary: 95 mg/dL (ref 70–99)
Glucose-Capillary: 124 mg/dL — ABNORMAL HIGH (ref 70–99)
Glucose-Capillary: 99 mg/dL (ref 70–99)
Glucose-Capillary: 142 mg/dL — ABNORMAL HIGH (ref 70–99)

## 2019-09-13 MED ORDER — SODIUM CHLORIDE 0.9 % IV BOLUS
500.0000 mL | Freq: Once | INTRAVENOUS | Status: AC
  Administered 2019-09-13: 500 mL via INTRAVENOUS

## 2019-09-13 NOTE — IPOC Note (Signed)
Overall Plan of Care [facility]) Patient Details Name: Haley Mayo MRN: [id number] DOB: [birth year]  Admitting Diagnosis: SAH (subarachnoid hemorrhage) [facility])  Hospital Problems: Principal Problem:   SAH (subarachnoid hemorrhage) ([place])     Functional Problem List: Nursing Bladder, Bowel, Nutrition, Safety, Skin Integrity  PT Balance, Safety, Endurance, Motor  OT Balance, Behavior, Cognition, Endurance, Motor, Perception, Safety, Vision  SLP Cognition  TR         Basic ADL's: OT Grooming, Bathing, Dressing, Toileting     Advanced  ADL's: OT Simple Meal Preparation, Light Housekeeping, Laundry     Transfers: PT Bed Mobility, Bed to Chair, Musician, Manufacturing systems engineer, Metallurgist: PT Ambulation, Emergency planning/management officer, Stairs     Additional Impairments: OT    SLP Swallowing, Social Cognition   Social Interaction, Problem Solving, Memory, Attention, Awareness  TR      Anticipated Outcomes Item Anticipated Outcome  Self Feeding Supervision  Swallowing  Mod I   Basic self-care  Supervision  Toileting  Supervision   Bathroom Transfers Supervision  Bowel/Bladder  cont x 2, mod I assist  Transfers  supervision  Locomotion  supervision  Communication     Cognition  Min A  Pain  less than 3   Safety/Judgment  cues/reminders    Therapy Plan: PT Intensity: Minimum of 1-2 x/day ,45 to 90 minutes PT Frequency: 5 out of 7 days PT Duration Estimated Length of Stay: 2-3 weeks OT Intensity: Minimum of 1-2 x/day, 45 to 90 minutes OT Frequency: 5 out of 7 days OT Duration/Estimated Length of Stay: 2 weeks SLP Intensity: Minumum of 1-2 x/day, 30 to 90 minutes SLP Frequency: 3 to 5 out of 7 days SLP Duration/Estimated Length of Stay: 2-2.5 weeks   Due to the current state of emergency, patients may not be receiving their 3-hours of Medicare-mandated therapy.   Team Interventions: Nursing Interventions Patient/Family Education, Bladder Management,  Bowel Management, Disease Management/Prevention, Skin Care/Wound Management, Dysphagia/Aspiration Precaution Training, Discharge Planning  PT interventions Ambulation/gait training, Discharge planning, DME/adaptive equipment instruction, Functional mobility training, Therapeutic Activities, UE/LE Strength taining/ROM, Training and development officer, Community reintegration, Neuromuscular re-education, Patient/family education, IT trainer, Therapeutic Exercise, Wheelchair propulsion/positioning, UE/LE Coordination activities  OT Interventions Training and development officer, Discharge planning, Self Care/advanced ADL retraining, Therapeutic Activities, UE/LE Coordination activities, Cognitive remediation/compensation, Functional mobility training, Patient/family education, Therapeutic Exercise, Visual/perceptual remediation/compensation, Wheelchair propulsion/positioning, UE/LE Strength taining/ROM, Neuromuscular re-education, DME/adaptive equipment instruction, Community reintegration  SLP Interventions Cognitive remediation/compensation, Dysphagia/aspiration precaution training, Internal/external aids, Therapeutic Activities, Environmental controls, Cueing hierarchy, Functional tasks, Patient/family education  TR Interventions    SW/CM Interventions Discharge Planning, Psychosocial Support, Patient/Family Education   Barriers to Discharge MD  Medical stability  Nursing      PT Lack of/limited family support, Other (comments) safety concerns  OT Lack of/limited family support, Behavior no caregivers at this time but son is planning to come to stay  SLP Decreased caregiver support, Lack of/limited family support    SW       Team Discharge Planning: Destination: PT-Home ,OT- Home , SLP-Home Projected Follow-up: PT- , OT-  24 hour supervision/assistance, Home health OT, SLP-24 hour supervision/assistance, Home Health SLP Projected Equipment Needs: PT-To be determined, OT- Tub/shower bench, 3 in 1  bedside comode, SLP-None recommended by SLP Equipment Details: PT-Pt has no equipment PTA, although uses some kind of chair in tub/shower., OT-  Patient/family involved in discharge planning: PT- Patient,  OT-Patient, SLP-Patient  MD ELOS: 14-16d Medical Rehab Prognosis:  Good Assessment:  69 year old right-handed female with history of hypertension as well as hyperlipidemia and tobacco abuse.  Per chart review lives alone independent prior to admission.  1 level home 4 steps to entry.  She has 2 children 1 lives in [place] 1 lives in [place].Son is to provide assist on discharge.  Presented 08/26/2019 to [facility] after being found down for an extended time.  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  CT angiogram of the chest showed a filling defect within the proximal right upper lobe apical and posterior segmental arteries consistent with pulmonary embolus.  Moderate cardiomegaly.  There was also a 2.3 cm right thyroid heterogeneous nodule.  Venous Doppler studies lower extremity showed no DVT.  Admission chemistries showed a WBC of 27,100, hemoglobin 15.4, CK 3162, lactic acid 1.2, glucose 166, BUN 28, creatinine 0.81, troponin 23, SARS coronavirus negative.  Chest x-ray showed mild basilar atelectasis and/or infiltrate.  Placed on broad-spectrum antibiotics.  She was transferred to [facility] 08/30/2019 for ongoing follow-up and care.  Underwent diagnostic cerebral angiogram stent support coil embolization of basilar aneurysm 09/01/2019 per Dr. Kathyrn Sheriff.  EEG completed showing no seizure.  She underwent IVC filter placement 09/02/2019 per interventional radiology for pulmonary emboli.  She did remain on  low-dose subcutaneous heparin for DVT prophylaxis.  Aspirin and Plavix later initiated after embolization.  Blood pressure controlled with Nimotop.  Her diet was slowly advanced to a dysphagia #3 thin liquid diet with alternative means of nutrition for nutritional support.  There was some documentation on urinary retention placed on low-dose Urecholine.  Therapy evaluations completed and patient was admitted for a comprehensive rehab program.   Now requiring 24/7 Rehab RN,MD, as well as CIR level PT, OT and SLP.  Treatment team will focus on ADLs and mobility with goals set at Sup  See Team Conference Notes for weekly updates to the plan of care

## 2019-09-13 NOTE — Progress Notes (Signed)
[facility] PHYSICAL MEDICINE & REHABILITATION PROGRESS NOTE   Subjective/Complaints: Orthostatic BP   Review of Systems  Constitutional: Negative for chills and fever.  HENT: Negative for hearing loss.   Eyes: Positive for double vision.  Respiratory: Positive for cough. Negative for shortness of breath.   Cardiovascular: Positive for leg swelling. Negative for chest pain and palpitations.  Gastrointestinal: Positive for constipation. Negative for heartburn, nausea and vomiting.  Genitourinary: Negative for dysuria, flank pain and hematuria.  Musculoskeletal: Positive for joint pain and myalgias.  Neurological: Positive for dizziness and headaches.  All other systems reviewed and are negative.  Objective:   No results found. Recent Labs    09/11/19 0745  WBC 14.4*  HGB 12.3  HCT 36.9  PLT 476*   Recent Labs    09/11/19 0745  NA 138  K 4.1  CL 101  CO2 25  GLUCOSE 108*  BUN 18  CREATININE 0.64  CALCIUM 10.0    Intake/Output Summary (Last 24 hours) at 09/13/2019 0955 Last data filed at 09/13/2019 0658 Gross per 24 hour  Intake 220 ml  Output 1075 ml  Net -855 ml     Physical Exam: Vital Signs Blood pressure (!) 106/59, pulse 72, temperature 98.4 F (36.9 C), temperature source Oral, resp. rate 18, height 5' (1.524 m), weight 67.8 kg, SpO2 92 %. There were no vitals taken for this visit. Physical Exam    General: No acute distress Mood and affect are appropriate Heart: Regular rate and rhythm no rubs murmurs or extra sounds Lungs: Clear to auscultation, breathing unlabored, no rales or wheezes Abdomen: Positive bowel sounds, soft nontender to palpation, nondistended Extremities: No clubbing, cyanosis, or edema Skin: No evidence of breakdown, no evidence of rash  Neurologic: Cranial nerves II through XII intact, motor strength is 5/5 in bilateral deltoid, bicep, tricep, grip,4-/5 bilateral  hip flexor, knee extensors, ankle dorsiflexor and plantar  flexor Sensory exam normal sensation to light touch and proprioception in bilateral upper and lower extremities Cerebellar exam normal finger to nose to finger as well as heel to shin in bilateral upper and lower extremities Musculoskeletal: Full range of motion in all 4 extremities. No joint swelling Psychiatric: She has a normal mood and affect.   Assessment/Plan: 1. 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  which require 3+ hours per day of interdisciplinary therapy in a comprehensive inpatient rehab setting.  Physiatrist is providing close team supervision and 24 hour management of active medical problems listed below.  Physiatrist and rehab team continue to assess barriers to discharge/monitor patient progress toward functional and medical goals  Care Tool:  Bathing    Body parts bathed by patient: Right arm, Left arm, Chest, Abdomen, Front perineal area, Buttocks, Right upper leg, Left upper leg, Face   Body parts bathed by helper: Right lower leg, Left lower leg     Bathing assist Assist Level: Moderate Assistance - Patient 50 - 74%     Upper Body Dressing/Undressing Upper body dressing   What is the patient wearing?: Pull over shirt    Upper body assist Assist Level: Moderate Assistance - Patient 50 - 74%    Lower Body Dressing/Undressing Lower body dressing      What is the patient wearing?: Pants     Lower body assist Assist for lower body dressing: Moderate Assistance - Patient 50 - 74%     Toileting Toileting Toileting Activity did not occur Landscape architect and  hygiene only): (She will not keep diaper on)  Toileting assist Assist for toileting: Total Assistance - Patient < 25%     Transfers Chair/bed transfer  Transfers assist     Chair/bed transfer assist level: Moderate Assistance - Patient 50 - 74%     Locomotion Ambulation   Ambulation  assist      Assist level: Minimal Assistance - Patient > 75% Assistive device: Walker-rolling Max distance: 75'   Walk 10 feet activity   Assist     Assist level: Minimal Assistance - Patient > 75% Assistive device: Walker-rolling   Walk 50 feet activity   Assist Walk 50 feet with 2 turns activity did not occur: Safety/medical concerns  Assist level: Minimal Assistance - Patient > 75% Assistive device: Walker-rolling    Walk 150 feet activity   Assist Walk 150 feet activity did not occur: Safety/medical concerns         Walk 10 feet on uneven surface  activity   Assist Walk 10 feet on uneven surfaces activity did not occur: Safety/medical concerns         Wheelchair     Assist Will patient use wheelchair at discharge?: Yes Type of Wheelchair: Manual    Wheelchair assist level: Moderate Assistance - Patient 50 - 74% Max wheelchair distance: 50    Wheelchair 50 feet with 2 turns activity    Assist        Assist Level: Moderate Assistance - Patient 50 - 74%   Wheelchair 150 feet activity     Assist  Wheelchair 150 feet activity did not occur: Safety/medical concerns       Blood pressure (!) 106/59, pulse 72, temperature 98.4 F (36.9 C), temperature source Oral, resp. rate 18, height 5' (1.524 m), weight 67.8 kg, SpO2 92 %.  Medical Problem List and Plan: 1.  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  Status post embolization 09/01/2019             -patient may shower             -ELOS/Goals: 2-2.5 weeks- Supervision to Mod I  CIR PT, OT , SLP 2.  Antithrombotics: -DVT/anticoagulation: Pulmonary emboli.  Status post IVC filter 09/02/2019.  Lower extremity Dopplers negative.  Continue low-dose heparin             -antiplatelet therapy: Aspirin 325 mg and Plavix 75 mg daily 3. Pain Management: Tylenol as needed.  5/28: Has post-SAH headaches. Start Topiramate 25mg   daily.Improved  4. Mood: Provide emotional support             -antipsychotic agents: N/A 5. Neuropsych: This patient is capable of making decisions on her own behalf. 6. Skin/Wound Care: Routine skin checks 7. Fluids/Electrolytes/Nutrition: Routine INO's with follow-up chemistries  5/28: BMP stable. Poor intake, fluid bolus 571ml                                                              8.  cerebral vasospasm.  Continue Nimotop as directed, hold am dose today discussed with RN  9.  2.3 cm right thyroid heterogeneous nodule.  Follow-up outpatient 10.  Tobacco abuse.  Counseling 11.  Dysphagia. Dysphagia #3 thin liquids. Follow-up speech therapy 12.  Urinary retention.  Continue low-dose  Urecholine.  Check PVR x3- might benefit from flomax- but has orthostasis Timed toileting   13. Stage II sacral decub- POA- likes to sit in 1 position- will need foam dressing and barrier cream for now and get her off sacrum 14. Leukocytosis: 5/28: trending downward.    LOS: 3 days A FACE TO [place] E Zanobia Griebel 09/13/2019, 9:55 AM

## 2019-09-13 NOTE — Progress Notes (Signed)
Per shift report from [place], [place], Dr. Ella Bodo gave verbal order to hold Nimdoipine if the SBP is lower than 110

## 2019-09-13 NOTE — Progress Notes (Signed)
Physical Therapy Session Note  Patient Details  Name: Haley Mayo MRN: [id number] Date of Birth: 01-20-1951  Today's Date: 09/13/2019 PT Individual Time: 0800-0900 and 1430-1530 PT Individual Time Calculation (min): 60 min and 60 min  Short Term Goals: Week 1:  PT Short Term Goal 1 (Week 1): Pt will transfer sup to sit w/ min A . PT Short Term Goal 2 (Week 1): Pt will transfer sit to stand and SPT  w/ min A PT Short Term Goal 3 (Week 1): Pt will amb w/ RW and min A up to 50'.  Skilled Therapeutic Interventions/Progress Updates:     Session 1: Patient in bed upon PT arrival. Patient alert and agreeable to PT session. Patient denied pain during session. Patient eating breakfast and requested to finish prior to starting mobility. Discussed bowl and bladder continence versus incontinence. Patient reports requiring manual stimulation to have a BM last night, not noted in chart, last BM 2 days ago in flowsheets. Patient confused throughout session and tantential speech about family when asked about home set up and support at d/c. Reports she lives alone and does not have any family to assist at d/c. Will follow-up with CSW. Focused session on discussion of d/c planning, PT goals, and management of OH symptoms with mobility.   Orthostatic Vitals:  Supine: BP 105/54, HR 72 Sitting: BP 90/55, HR 83 Standing: BP 82/53, HR 91  B thigh high TED hose applied with total A in supine. Orthostatic vitals: Supine: BP 90/55, HR 81 Sitting: 78/43, HR 85 (worse dizziness and requested to lie down) Patient reported dizziness in sitting and standing each trial, RN made aware of vitals and interventions.   Therapeutic Activity: Bed Mobility: Patient performed supine to/from sit x4 with supervision using the bed rail and min A x1 due to confusion and decreased motor planning. Provided verbal cues for decreased use of bed rails and pushing through her elbow then hand to sit up. Patient reported  dizziness each time when sitting that resolved after 1-2 min in sitting with cues for finding a focal point for gaze stabilization.  Transfers: Patient performed sit to/from stand x1 with CGA using a RW, stood x1 min and requested to sit due to dizziness and OH (see above). Provided verbal cues for hand placement on RW and reaching back to sit for safety.  Patient in bed at end of session with breaks locked, bed alarm set, and all needs within reach. Mobility limited by [facility] this session, also patient requires increased time for all mobility due to tangential speech and need for frequent redirection to attend to task.   Session 2; Per discussion with RN prior to session, patient was very restless and tangential throughout shift and did get up on her own to go to the bathroom without assistance. Telesitter now placed in patient's room. Patient in bed upon PT arrival. Patient alert and agreeable to PT session. Patient denied pain during session. RN provided medications at beginning of session. Patient inquired about her IV and medications requiring several explanations from RN and PT about what the medications were for and how much she was getting of each. Patient required several attempts to be redirected to the conversation and provide consent for receiving the medication. Patient eventually calmly consented to taking medications and RN unhooked IV for therapy.   Patient performed supine to sit with supervision using bed rail and sat EOB to take medications. When asked, patient did recall where she was, however, then preceded to  discuss getting sushi from [facility]: [place] Rhonda W and was difficult to redirect back to orientation questions. Placed RW in front of patient and instructed her to perform stand pivot to w/c. Patient stood and began walking out of the room despite cues for sitting in the w/c. She ambulated >150 ft with CGA-min A with a RW with w/c follow around the nurses station then into the day room before requesting  to sit. Patient was unaware of the w/c being behind her despite PT cuing her to sit and required max cues to sit in the w/c with poor motor planning.   Returned to orientation questions and patient able to state that she had a stroke and thought tomorrow was Sunday. Re-orientated patient and provided general stroke education. Patient required min cues to attend x1 then was unable to attend to conversation starting to talk about her sisters and her parents. Per patient's discussion, most of her family has passed away and she is very lonely. Provided emotional support to patient and education on coping mechanisms for dealing with grief and feeling alone. Discussed activities patient enjoys. Patient unable to identify any activities, however spoke about cooking frequently during discussion. She then ambulated 180 feet using the RW with CGA for balance and min A for AD management without w/c follow. Set goal for patient to ambulated around the nurses station back to the w/c. Patient unable to recall that she could sit in the w/c and required max cues for locating and sitting in the w/c at end of ambulation. Patient reported feeling severe fatigue after ambulation. Returned patient to her room via w/c with total A due to patient fatigue. Patient transferred back to bed with CGA without an AD. Performed sit to supine with supervision with use of bed rail.   Patient in bed with HOB >30 degrees with Telesitter in room at end of session with breaks locked, bed alarm set, and all needs within reach. PT doffed B socks and TED hose with total A due to patient falling asleep at end of session.    Therapy Documentation Precautions:  Precautions Precautions: Fall Precaution Comments: impulsive, L lateral lean Restrictions Weight Bearing Restrictions: No    Therapy/Group: Individual Therapy  Haley Mayo L Haley Mayo PT, DPT  09/13/2019, 3:33 PM

## 2019-09-13 NOTE — Progress Notes (Signed)
Pt very confused this evening, A &O x1. Pt continues to show concern about her medication and requiring extended amounts of verbal cueing and reassurance of why the medication is ordered. Pt continues to attempt to get out of bed to use the restroom. Staff helped the pt use the restroom 15 mins prior with no success of voiding. Pt bladder scan showed 98 ml. Pt educated on not getting out of bed unless staff is present. Pt resting in bed at this time, call light in reach.

## 2019-09-13 NOTE — Progress Notes (Signed)
Responded to bed alarm in room and found patient sitting on side of bed with IV pole in hand. Positioned patient in the bed and went to bathroom to get washcloths and found toilet full of urine and stool. Patient had walked herself and IV pole to bathroom without assistance. Post void 15 mL. Discussed with patient the need to call for assistance but unsure if she will be able to remember and follow instructions. Orders placed for Telesitter.

## 2019-09-14 ENCOUNTER — Inpatient Hospital Stay (HOSPITAL_COMMUNITY): Payer: Medicare Other

## 2019-09-14 ENCOUNTER — Inpatient Hospital Stay (HOSPITAL_COMMUNITY): Payer: Medicare Other | Admitting: Occupational Therapy

## 2019-09-14 DIAGNOSIS — R339 Retention of urine, unspecified: Secondary | ICD-10-CM

## 2019-09-14 DIAGNOSIS — G441 Vascular headache, not elsewhere classified: Secondary | ICD-10-CM

## 2019-09-14 DIAGNOSIS — I69391 Dysphagia following cerebral infarction: Secondary | ICD-10-CM

## 2019-09-14 LAB — GLUCOSE, CAPILLARY
Glucose-Capillary: 116 mg/dL — ABNORMAL HIGH (ref 70–99)
Glucose-Capillary: 81 mg/dL (ref 70–99)
Glucose-Capillary: 91 mg/dL (ref 70–99)
Glucose-Capillary: 162 mg/dL — ABNORMAL HIGH (ref 70–99)

## 2019-09-14 MED ORDER — BETHANECHOL CHLORIDE 25 MG PO TABS
25.0000 mg | ORAL_TABLET | Freq: Three times a day (TID) | ORAL | Status: DC
  Administered 2019-09-14 – 2019-09-25 (×32): 25 mg via ORAL
  Filled 2019-09-14 (×32): qty 1

## 2019-09-14 NOTE — Progress Notes (Signed)
Physical Therapy Session Note  Patient Details  Name: Haley Mayo MRN: [id number] Date of Birth: [birth year]  Today's Date: 09/14/2019 PT Individual Time: 0800-0900 and 1410-1435 PT Individual Time Calculation (min): 60 min and 25 min   Short Term Goals: Week 1:  PT Short Term Goal 1 (Week 1): Pt will transfer sup to sit w/ min A . PT Short Term Goal 2 (Week 1): Pt will transfer sit to stand and SPT  w/ min A PT Short Term Goal 3 (Week 1): Pt will amb w/ RW and min A up to 50'.  Skilled Therapeutic Interventions/Progress Updates:     Session 1: Patient in bed upon PT arrival. Patient alert and agreeable to PT session. Patient denied pain during session, reported feeling "groggy" this morning and stated that she did not sleep that well as she stated "I had a lot on my mind." When asked about what was keeping her awake, she reported "they think this is my fault," but did not elaborate and switched topics, reporting that breakfast was good this morning. Continues to be tangential with speech and with poor attention to both cognitive and mobility tasks throughout session.   Therapeutic Activity: Bed Mobility: Patient performed supine to/from sit x2 with supervision with use of bed rails and HOB elevated to 30 degrees. Provided verbal cues for bringing her LEs off the bed to sit up on first trial. PT donned B TED hose and non-skid socks with max-total A. Patient initially stated that she would don TED hose and set up 1 compression stocking to place it on her foot, scrunched for easier placement, then placed the stocking down and began talking about staff, although confused about hospital staff not being in school asking about curriculum and changing semesters. PT provided reorientation before donned TEDs.  Patient sat EOB with supervision-mod I to take morning medications with RN in the room.  Transfers: Patient performed sit to/from stand CGA-close supervision suing a RW. Provided verbal cues  for hand placement on RW and reaching back to sit for safety. She ambulated to the bathroom with CGA for safety using the RW, see gait details below, and performed a toilet transfer using the grab bar and RW with supervision. She was continent of bowl and bladder on the toilet, NT made aware. She was independent with peri-care and did not have pants on, due to patient's request. After ambulating back to the bed patient reported increased dizziness and requested to lie down. Performed orthostatic vitals after patient took a lying rest break x2 min and symptoms fully resolved.  Orthostatic Vitals:  Supine (HOB 30 degrees): BP 104/50, HR 72 Sitting: BP 111/59, HR 87 Standing: BP 98/54, HR 99 Standing x3 min: BP 100/69, HR 101 Patient asymptomatic throughout.   Gait Training:  Patient ambulated ~100 feet x2 with a sitting rest break in between due to decreased activity tolerance using RW with CGA-min A. Ambulated with increased trunk flexion, decreased step height and length progressing to shuffling gait with fatigue, slow gait speed, and narrow BOS. Provided verbal cues for staying close to RW for safety, increased step height, and erect posture. Also provided min-mod A for AD management throughout due to RW being far in front of her and assist for turning RW to avoid objects due to patient attending to conversation over gait training.   Patient in bed with Telesitter in the room at end of session with breaks locked, bed alarm set, and all needs within reach.   Session  2: Patient in bed upon PT arrival. Patient alert and agreeable to PT session. Patient reported 6-9/10 L low back pain during session, RN made aware, patient declined pain medicine. PT provided repositioning, rest breaks, and distraction as pain interventions throughout session. Focused session on positioning and manual therapy for pain management. Patient reports no history of back pain. Discussed increased muscle soreness due to hospital  stay and increased mobility with intensive therapy. Educated patient on lying on her back with knees bent or on her side with a pillow between her knees for positioning of comfort, patient preferred side-lying.  Initiated pain neuroscience re-education during soft tissue mobilization to paraspinals and gluteal muscles on L side. Performed intermittent trigger point release with sustained pressure and J stroke over gluteal muscles. Performed guided imagery for pain management. Patient relaxed and "sleepy" after reporting improved pain and requesting to take a nap due to relaxation and improved comfort.   Patient in bed with 4 rails up and bed alarm set with Telesitter in the room at end of session with breaks locked and all needs within reach.   Returned to finish session at 3:15pm, patient asleep upon PT entry. Easily aroused to verbal stimulation. Patient reported continued reduced pain and requested to continue resting this afternoon. PT offered bed level exercises and stretching to reduce return of low back pain. Patient continued to decline due to fatigue. Patient missed 35 min of skilled PT due to fatigue, RN made aware. Will attempt to make-up missed time as able.    Patient in bed with 4 rails up and bed alarm set with Telesitter in the room at end of session with breaks locked and all needs within reach.    Therapy Documentation Precautions:  Precautions Precautions: Fall Precaution Comments: impulsive, L lateral lean Restrictions Weight Bearing Restrictions: No    Therapy/Group: Individual Therapy  Ed Mandich L Daily Doe PT, DPT  09/14/2019, 3:32 PM

## 2019-09-14 NOTE — Progress Notes (Addendum)
Pt refused the AM dose of Heparin, Pt was educated on the importance of taking it. Pt states " I would like to talk to the doctor, because I Marche Hottenstein not feel like I need this medication. Please let the doctor know I would like to talk to him about it." Provider will be informed of the pts request.   Pt was ambulated to the bathroom for an attempt to void. Pt was unsuccessful, and transferred back to bed.  Pt required in/out cath after being bladder scanned. Pt tolerated the procedure well. Small amount of discharged noted while providing pericare   Pt resting in bed, call light in reach

## 2019-09-14 NOTE — Progress Notes (Signed)
[facility] Individual Statement of Services  Patient Name:  Haley Mayo  Date:  09/14/2019  Welcome to the [facility].  Our goal is to provide you with an individualized program based on your diagnosis and situation, designed to meet your specific needs.  With this comprehensive rehabilitation program, you will be expected to participate in at least 3 hours of rehabilitation therapies Monday-Friday, with modified therapy programming on the weekends.  Your rehabilitation program will include the following services:  Physical Therapy (PT), Occupational Therapy (OT), 24 hour per day rehabilitation nursing, Therapeutic Recreaction (TR), Psychology, Neuropsychology, Care Coordinator, Rehabilitation Medicine, Nutrition Services, Pharmacy Services and Other  Weekly team conferences will be held on Tuesdays to discuss your progress.  Your Inpatient Rehabilitation Care Coordinator will talk with you frequently to get your input and to update you on team discussions.  Team conferences with you and your family in attendance may also be held.  Expected length of stay: 2-3 weeks   Overall anticipated outcome: Supervision  Depending on your progress and recovery, your program may change. Your Inpatient Rehabilitation Care Coordinator will coordinate services and will keep you informed of any changes. Your Inpatient Rehabilitation Care Coordinator's name and contact numbers are listed  below.  The following services may also be recommended but are not provided by the [facility] will be made to provide these services after discharge if needed.  Arrangements include referral to agencies that provide these services.  Your insurance has been verified to be:  Medicare A/B  Your primary doctor is:   Beverlyn Roux  Pertinent information will be shared with your doctor and your insurance company.  Inpatient Rehabilitation Care Coordinator:  Cathleen Corti [phone] or [phone]  Information discussed with and copy given to patient by: Rana Snare, 09/14/2019, 11:37 AM

## 2019-09-14 NOTE — Progress Notes (Signed)
Occupational Therapy Session Note  Patient Details  Name: Haley Mayo MRN: [id number] Date of Birth: 31-Jul-1950  Today's Date: 09/14/2019 OT Individual Time: 9381-8299 OT Individual Time Calculation (min): 72 min    Short Term Goals: Week 1:  OT Short Term Goal 1 (Week 1): Pt will perform toilet transfers consistently with Min A OT Short Term Goal 2 (Week 1): Pt will thread BLEs into pants with supervision OT Short Term Goal 3 (Week 1): Pt will sequence bathing 11/11 body parts with min verbal cues OT Short Term Goal 4 (Week 1): Pt will perform clothing management with Min A for dynamic balance during toileting  Skilled Therapeutic Interventions/Progress Updates:    Pt greeted semi-reclined in bed without pants or a brief on and was trying to remove TED hose. Pt was successful in removing L LE from TEDs and was working on R. OT introduced self to pt. Pt tangential and confused, disoriented to place and situation. Pt reported she was trying to figure out if she needed legal assistance for this situation she was in. OT reoriented pt to place, situation, and reason for therapy. Pt agreeable to participate in BADL tasks. Pt donned non-skid socks at EOB with set-up A. She then completed sit<>stand with verbal cues for hand placement prior to standing. Pt ambulated into bathroom w/ RW and CGA with verbal cues for RW positioning. Pt needed extended time to try to void, but was unsuccessful. Pt wanted to keep trying and was perseverative on voiding. OT able to re-direct pt to get in shower. Pt ambulated without AD into shower with CGA. Pt needed cues to remove clothing prior to shower stating "do I really have to remove my socks?"  Bathing completed with CGA and verbal cues for thoroughness. Dressing completed from wc sit<>stand with CGA 2/2 slight posterior LOB. Standing balance/endurance with standing grooming tasks and verbal cues for thoroughness. Pt returned to bed at end of session with CGA.  Pt left semi-reclined in bed with bed alarm on, call bell in reach, and needs met.   Therapy Documentation Precautions:  Precautions Precautions: Fall Precaution Comments: impulsive, L lateral lean Restrictions Weight Bearing Restrictions: No Pain: Pain Assessment Pain Scale: 0-10 Pain Score: 0-No pain   Therapy/Group: Individual Therapy  [name] 09/14/2019, 11:15 AM

## 2019-09-14 NOTE — Progress Notes (Signed)
Inpatient Rehabilitation Care Coordinator Assessment and Plan  Patient Details  Name: Haley Mayo MRN: [id number] Date of Birth: Jun 19, 1950  Today's Date: 09/14/2019  Problem List:  Patient Active Problem List   Diagnosis Date Noted  . SAH (subarachnoid hemorrhage) ([facility]) 09/10/2019  . Pressure injury of skin 09/05/2019  . Subarachnoid hemorrhage ([facility]) 08/30/2019  . CAP (community acquired pneumonia) 08/27/2019  . Community acquired pneumonia 08/26/2019  . Elevated CK 08/26/2019  . Generalized weakness 08/26/2019  . Healthcare maintenance 02/02/2011  . Smoking 02/02/2011  . 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 Hyperlipemia 01/27/2010  . LEUKOCYTOSIS 01/27/2010  . Essential hypertension, benign 10/04/2009   Past Medical History:  Past Medical History:  Diagnosis Date  . HLD (hyperlipidemia)   . HTN (hypertension)    controlled  . Leukocytosis    ?   Marland Kitchen Neoplasm of ear    left ear lobe  . Obesity   . Wears dentures    upper   Past Surgical History:  Past Surgical History:  Procedure Laterality Date  . ABDOMINAL HYSTERECTOMY  Age 69  . COLONOSCOPY  09/2004   normal, rpt 10 yrs  . EAR CYST EXCISION Left 09/27/2017   Procedure: EXCISION EAR LOBE CYST;  Surgeon: Beverly Gust, MD;  Location: [facility];  Service: ENT;  Laterality: Left;  LOCAL ONLY  . IR ANGIO VERTEBRAL SEL VERTEBRAL UNI L MOD SED  09/01/2019  . IR ANGIOGRAM FOLLOW UP STUDY  09/01/2019  . IR ANGIOGRAM FOLLOW UP STUDY  09/01/2019  . IR ANGIOGRAM FOLLOW UP STUDY  09/01/2019  . IR ANGIOGRAM FOLLOW UP STUDY  09/01/2019  . IR ANGIOGRAM FOLLOW UP STUDY  09/01/2019  . IR IVC FILTER PLMT / S&I /IMG GUID/MOD SED  09/02/2019  . IR NEURO EACH ADD'L AFTER BASIC UNI LEFT (MS)  09/01/2019  . IR TRANSCATH/EMBOLIZ  09/01/2019  . OOPHORECTOMY    . RADIOLOGY WITH ANESTHESIA N/A 09/01/2019   Procedure: IR WITH ANESTHESIA;  Surgeon: Consuella Lose, MD;  Location: [facility];  Service: Radiology;  Laterality: N/A;  .  TOTAL VAGINAL HYSTERECTOMY  05/2001   heavy bleeding, fibroids   Social History:  reports that she has been smoking cigarettes. She has a 40.00 pack-year smoking history. She uses smokeless tobacco. She reports that she does not drink alcohol or use drugs.  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 Pt was confused as unable to answer SW questions directly, and was tangential. Recent Psychosocial Issues: Denies Psychiatric History: Denies Substance Abuse History: Denies  Patient / Family Perceptions, Expectations & Goals Pt/Family understanding of illness & functional limitations: Unable to determine Premorbid pt/family roles/activities: Independent Anticipated changes in roles/activities/participation: Assistance with ADLs/IADLs  Education officer, environmental Agencies: None Premorbid Home Care/DME Agencies: None Resource referrals recommended: Neuropsychology  Discharge Planning Living  Arrangements: Children Support Systems: Children Type of  Residence: Private residence Financial Screen Referred: No Does the patient have any problems obtaining your medications?: No Care Coordinator Barriers to Discharge: Decreased caregiver support, Lack of/limited family support Care Coordinator Anticipated Follow Up Needs: HH/OP  Clinical Impression SW met with pt in room to introduce self, explain role, and discuss discharge process. Pt was confused.   SW called pt son Virgel Gess but unable to connect as Research scientist (medical) was full. SW will continue to make efforts to discuss d/c plan.   Vandy Tsuchiya A Elna Radovich 09/14/2019, 1:06 PM

## 2019-09-14 NOTE — Progress Notes (Addendum)
[facility] PHYSICAL MEDICINE & REHABILITATION PROGRESS NOTE   Subjective/Complaints: Asked if I had changed her home meds. Would stay here as long as it's "covered".   ROS: non-focused d/t cognition   Objective:   No results found. No results for input(s): WBC, HGB, HCT, PLT in the last 72 hours. No results for input(s): NA, K, CL, CO2, GLUCOSE, BUN, CREATININE, CALCIUM in the last 72 hours.  Intake/Output Summary (Last 24 hours) at 09/14/2019 1021 Last data filed at 09/14/2019 0700 Gross per 24 hour  Intake 984.71 ml  Output 1620 ml  Net -635.29 ml     Physical Exam: Vital Signs Blood pressure (!) 113/57, pulse 64, temperature 98.9 F (37.2 C), resp. rate 18, height 5' (1.524 m), weight 68 kg, SpO2 97 %. There were no vitals taken for this visit. Physical Exam    Constitutional: No distress . Vital signs reviewed. HEENT: EOMI, oral membranes moist Neck: supple Cardiovascular: RRR without murmur. No JVD    Respiratory/Chest: CTA Bilaterally without wheezes or rales. Normal effort    GI/Abdomen: BS +, non-tender, non-distended Ext: no clubbing, cyanosis, or edema Psych: pleasantly confused. Skin: Stage II sacral decub Neurologic: Cranial nerves II through XII intact, motor strength is 5/5 in bilateral deltoid, bicep, tricep, grip,4-/5 bilateral  hip flexor, knee extensors, ankle dorsiflexor and plantar flexor Senses pain in all 4's.  Musculoskeletal: Full range of motion in all 4 extremities. No joint swelling    Assessment/Plan: 1. 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  which require 3+ hours per day of interdisciplinary therapy in a comprehensive inpatient rehab setting.  Physiatrist is providing close team supervision and 24 hour management of active medical problems listed below.  Physiatrist and rehab team continue to assess barriers to discharge/monitor patient  progress toward functional and medical goals  Care Tool:  Bathing    Body parts bathed by patient: Right arm, Left arm, Chest, Abdomen, Front perineal area, Buttocks, Right upper leg, Left upper leg, Face   Body parts bathed by helper: Right lower leg, Left lower leg     Bathing assist Assist Level: Moderate Assistance - Patient 50 - 74%     Upper Body Dressing/Undressing Upper body dressing   What is the patient wearing?: Pull over shirt    Upper body assist Assist Level: Moderate Assistance - Patient 50 - 74%    Lower Body Dressing/Undressing Lower body dressing      What is the patient wearing?: Pants     Lower body assist Assist for lower body dressing: Moderate Assistance - Patient 50 - 74%     Toileting Toileting Toileting Activity did not occur (Clothing management and hygiene only): (She will not keep diaper on)  Toileting assist Assist for toileting: Total Assistance - Patient < 25%     Transfers Chair/bed transfer  Transfers assist     Chair/bed transfer assist level: Minimal Assistance - Patient > 75% Chair/bed transfer assistive device: Programmer, multimedia   Ambulation assist      Assist level: Minimal Assistance - Patient > 75% Assistive device: Walker-rolling Max distance: 180'   Walk 10 feet activity   Assist     Assist level: Minimal Assistance - Patient > 75% Assistive device: Walker-rolling   Walk 50 feet activity   Assist Walk 50 feet with 2 turns activity did not occur: Safety/medical concerns  Assist level: Minimal Assistance - Patient > 75% Assistive device: Walker-rolling  Walk 150 feet activity   Assist Walk 150 feet activity did not occur: Safety/medical concerns  Assist level: Minimal Assistance - Patient > 75% Assistive device: Walker-rolling    Walk 10 feet on uneven surface  activity   Assist Walk 10 feet on uneven surfaces activity did not occur: Safety/medical concerns          Wheelchair     Assist Will patient use wheelchair at discharge?: Yes Type of Wheelchair: Manual    Wheelchair assist level: Moderate Assistance - Patient 50 - 74% Max wheelchair distance: 50    Wheelchair 50 feet with 2 turns activity    Assist        Assist Level: Moderate Assistance - Patient 50 - 74%   Wheelchair 150 feet activity     Assist  Wheelchair 150 feet activity did not occur: Safety/medical concerns       Blood pressure (!) 113/57, pulse 64, temperature 98.9 F (37.2 C), resp. rate 18, height 5' (1.524 m), weight 68 kg, SpO2 97 %.  Medical Problem List and Plan: 1.  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  Status post embolization 09/01/2019             -patient may shower             -ELOS/Goals: 2-2.5 weeks- Supervision to Mod I  CIR PT, OT , SLP 2.  Antithrombotics: -DVT/anticoagulation: Pulmonary emboli.  Status post IVC filter 09/02/2019.  Lower extremity Dopplers negative.  Continue low-dose heparin             -antiplatelet therapy: Aspirin 325 mg and Plavix 75 mg daily 3. Pain Management: Tylenol as needed.  5/31: post-SAH headaches  Topiramate 25mg  daily with improvement  4. Mood: Provide emotional support             -antipsychotic agents: N/A 5. Neuropsych: This patient is capable of making decisions on her own behalf.  -telesitter for safety 6. Skin/Wound Care: Routine skin checks 7. Fluids/Electrolytes/Nutrition:    5/28: BMP stable. Poor intake, fluid bolus 540ml      -encouraged PO  -recheck labs this week 8.  cerebral vasospasm.  Continue Nimotop as directed, hold if bp too soft 9.  2.3 cm right thyroid heterogeneous nodule.  Follow-up outpatient 10.  Tobacco abuse.  Counseling 11.  Dysphagia. Dysphagia #3 thin liquids. Follow-up speech therapy 12.  Urinary retention.  Continue low-dose Urecholine.  Check PVR x3- might benefit from flomax- but has  orthostasis Timed toileting, increase urecholine to 25mg   13. Stage II sacral decub- POA- likes to sit in 1 position- will need foam dressing and barrier cream for now and get her off sacrum 14. Leukocytosis: 5/28: trending downward.    LOS: 4 days A FACE TO [place] 09/14/2019, 10:21 AM

## 2019-09-15 ENCOUNTER — Inpatient Hospital Stay (HOSPITAL_COMMUNITY): Payer: Medicare Other

## 2019-09-15 ENCOUNTER — Inpatient Hospital Stay (HOSPITAL_COMMUNITY): Payer: Medicare Other | Admitting: Occupational Therapy

## 2019-09-15 ENCOUNTER — Inpatient Hospital Stay (HOSPITAL_COMMUNITY): Payer: Medicare Other | Admitting: Speech Pathology

## 2019-09-15 LAB — GLUCOSE, CAPILLARY
Glucose-Capillary: 96 mg/dL (ref 70–99)
Glucose-Capillary: 96 mg/dL (ref 70–99)
Glucose-Capillary: 103 mg/dL — ABNORMAL HIGH (ref 70–99)
Glucose-Capillary: 118 mg/dL — ABNORMAL HIGH (ref 70–99)

## 2019-09-15 NOTE — Progress Notes (Signed)
Speech Language Pathology Daily Session Note  Patient Details  Name: Haley Mayo MRN: [id number] Date of Birth: 1950-05-01  Today's Date: 09/15/2019 SLP Individual Time: 0720-0805 SLP Individual Time Calculation (min): 45 min  Short Term Goals: Week 1: SLP Short Term Goal 1 (Week 1): Patient will demonstrate functional problem solving for basic and familiar tasks with Mod verbal cues. SLP Short Term Goal 2 (Week 1): Patient will utilize external aids to recall new, daily information with Mod verbal and visual cues. SLP Short Term Goal 3 (Week 1): Patient will demonstrate sustained attention to functional tasks for 15 minutes with Mod verbal cues for redirection. SLP Short Term Goal 4 (Week 1): Patient will demonstrate topic maintenance for ~3 turns with Mod verbal cues for redirection SLP Short Term Goal 5 (Week 1): Patient will demonstrate emergent awareness of errors during functional tasks with Mod verbal cues. SLP Short Term Goal 6 (Week 1): Patient will demonstrate efficient mastication and complete oral clearance without overt s/s of aspiration with trials of regular textures with supervision verbal cues over 2 sessions.  Skilled Therapeutic Interventions: Skilled treatment session focused on cognitive goals. Upon arrival, patient requested a timeline of current hospitalization. SLP began to provide a notebook with a written timeline, however, patient switched topic of conversation to her missing cell phone. Patient made aware that she has not had a phone while CIR, however, patient perseverative on staff stealing her items. Patient went through personal belongings but no phone was recovered (purse, keys, etc were in a bag and placed in a large ziploc bag). Patient then became perseverative on how therapy billed her insurance and became verbally frustrated when SLP attempted to redirect conversation. Despite Max A and multimodal cues, patient unable to maintain topic of conversation or  sustained attention for more than ~5 minutes. Patient left upright in bed with alarm on and all needs within reach. Continue with current plan of care.      Pain No/Denies Pain   Therapy/Group: Individual Therapy  Haley Mayo 09/15/2019, 8:07 AM

## 2019-09-15 NOTE — Progress Notes (Addendum)
[place] PHYSICAL MEDICINE & REHABILITATION PROGRESS NOTE   Subjective/Complaints: Up with OT in day room. Trying to work through series of events which brought her to where she currently is.   ROS: Patient denies fever, rash, sore throat, blurred vision, nausea, vomiting, diarrhea, cough, shortness of breath or chest pain, joint or back pain, headache, or mood change.   Objective:   No results found. No results for input(s): WBC, HGB, HCT, PLT in the last 72 hours. No results for input(s): NA, K, CL, CO2, GLUCOSE, BUN, CREATININE, CALCIUM in the last 72 hours.  Intake/Output Summary (Last 24 hours) at 09/15/2019 0955 Last data filed at 09/15/2019 0012 Gross per 24 hour  Intake 360 ml  Output 800 ml  Net -440 ml     Physical Exam: Vital Signs Blood pressure (!) 107/54, pulse (!) 58, temperature 98.4 F (36.9 C), temperature source Oral, resp. rate 16, height 5' (1.524 m), weight 67.9 kg, SpO2 99 %. There were no vitals taken for this visit. Physical Exam    Constitutional: No distress . Vital signs reviewed. HEENT: EOMI, oral membranes moist Neck: supple Cardiovascular: RRR without murmur. No JVD    Respiratory/Chest: CTA Bilaterally without wheezes or rales. Normal effort    GI/Abdomen: BS +, non-tender, non-distended Ext: no clubbing, cyanosis, or edema Psych: pleasant and cooperative, a little anxious Skin: Stage II sacral decub not visualized today, foam dressing Neurologic: oriented to person, place, reason (somewhat). Confabulates a lot. normal language. Cranial nerves II through XII intact, motor strength is 5/5 in bilateral deltoid, bicep, tricep, grip,4-/5 bilateral  hip flexor, knee extensors, ankle dorsiflexor and plantar flexor Senses pain in all 4's.  Musculoskeletal: Full range of motion in all 4 extremities. No joint swelling    Assessment/Plan: 1. 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  which require 3+ hours per day of interdisciplinary therapy in a comprehensive inpatient rehab setting.  Physiatrist is providing close team supervision and 24 hour management of active medical problems listed below.  Physiatrist and rehab team continue to assess barriers to discharge/monitor patient progress toward functional and medical goals  Care Tool:  Bathing    Body parts bathed by patient: Right arm, Left arm, Chest, Abdomen, Front perineal area, Buttocks, Right upper leg, Left upper leg, Face, Left lower leg, Right lower leg   Body parts bathed by helper: Right lower leg, Left lower leg     Bathing assist Assist Level: Minimal Assistance - Patient > 75%     Upper Body Dressing/Undressing Upper body dressing   What is the patient wearing?: Pull over shirt    Upper body assist Assist Level: Set up assist    Lower Body Dressing/Undressing Lower body dressing      What is the patient wearing?: Pants     Lower body assist Assist for lower body dressing: Minimal Assistance - Patient > 75%     Toileting Toileting Toileting Activity did not occur (Clothing management and hygiene only): (She will not keep diaper on)  Toileting assist Assist for toileting: Minimal Assistance - Patient > 75%     Transfers Chair/bed transfer  Transfers assist     Chair/bed transfer assist level: Minimal Assistance - Patient > 75% Chair/bed transfer assistive device: Programmer, multimedia   Ambulation assist      Assist level: Minimal Assistance - Patient > 75% Assistive device: Walker-rolling Max distance: 180'   Walk 10 feet activity  Assist     Assist level: Minimal Assistance - Patient > 75% Assistive device: Walker-rolling   Walk 50 feet activity   Assist Walk 50 feet with 2 turns activity did not occur: Safety/medical concerns  Assist level: Minimal Assistance - Patient > 75% Assistive device: Walker-rolling     Walk 150 feet activity   Assist Walk 150 feet activity did not occur: Safety/medical concerns  Assist level: Minimal Assistance - Patient > 75% Assistive device: Walker-rolling    Walk 10 feet on uneven surface  activity   Assist Walk 10 feet on uneven surfaces activity did not occur: Safety/medical concerns         Wheelchair     Assist Will patient use wheelchair at discharge?: Yes Type of Wheelchair: Manual    Wheelchair assist level: Moderate Assistance - Patient 50 - 74% Max wheelchair distance: 50    Wheelchair 50 feet with 2 turns activity    Assist        Assist Level: Moderate Assistance - Patient 50 - 74%   Wheelchair 150 feet activity     Assist  Wheelchair 150 feet activity did not occur: Safety/medical concerns       Blood pressure (!) 107/54, pulse (!) 58, temperature 98.4 F (36.9 C), temperature source Oral, resp. rate 16, height 5' (1.524 m), weight 67.9 kg, SpO2 99 %.  Medical Problem List and Plan: 1.  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  Status post coil embolization 09/01/2019             -patient may shower             -ELOS/Goals: 2-2.5 weeks- Supervision to Mod I  CIR PT, OT , SLP  -team conference today.  2.  Antithrombotics: -DVT/anticoagulation: Pulmonary emboli.  Status post IVC filter 09/02/2019.  Lower extremity Dopplers negative.  Continue low-dose heparin             -antiplatelet therapy: Aspirin 325 mg and Plavix 75 mg daily 3. Pain Management: Tylenol as needed.  5/31: post-SAH headaches  Topiramate 25mg  daily seems to have helped  4. Mood: Provide emotional support             -antipsychotic agents: N/A 5. Neuropsych: This patient is capable of making decisions on her own behalf.  -telesitter for safety 6. Skin/Wound Care: Routine skin checks 7. Fluids/Electrolytes/Nutrition:    5/28: BMP stable.        -encouraged PO, not  sure fluid intake accurate, eating 100%  -recheck labs THursday 8.  Cerebral vasospasm.  Continue Nimotop as directed, hold if bp too soft 9.  2.3 cm right thyroid heterogeneous nodule.  Follow-up outpatient 10.  Tobacco abuse.  Counseling 11.  Dysphagia. Dysphagia #3 thin liquids. Follow-up speech therapy 12.  Urinary retention.     Timed toileting, increased urecholine to 25mg  TID on 5/31  -bladder volumes inconistent 13. Stage II sacral decub- POA- continue foam dressing and barrier cream for now and get her off sacrum 14. Leukocytosis: 5/28: trending downward.    LOS: 5 days A FACE TO FACE EVALUATION WAS PERFORMED  Meredith Staggers 09/15/2019, 9:55 AM

## 2019-09-15 NOTE — Progress Notes (Addendum)
Patient alert and oriented person/time( know holiday and year) upon rounds, answers questions at times bur rambling sentences and thoughts, Denis pain or discomfort, call bell within reach, respiration unlabored on room air. Continue Tele- sitter monitoring for safety

## 2019-09-15 NOTE — Patient Care Conference (Signed)
Inpatient RehabilitationTeam Conference and Plan of Care Update Date: 09/15/2019   Time: 4:20 PM    Patient Name: Haley Mayo      Medical Record Number: [id number]  Date of Birth: 1950-08-17 Sex: Female         Room/Bed: 4W20C/4W20C-01 Payor Info: Payor: MEDICARE / Plan: MEDICARE PART A AND B / Product Type: *No Product type* /    Admit Date/Time:  09/10/2019  4:18 PM  Primary Diagnosis:  SAH (subarachnoid hemorrhage) ([place])  Patient Active Problem List   Diagnosis Date Noted  . SAH (subarachnoid hemorrhage) ([place]) 09/10/2019  . Pressure injury of skin 09/05/2019  . Subarachnoid hemorrhage ([place]) 08/30/2019  . CAP (community acquired pneumonia) 08/27/2019  . Community acquired pneumonia 08/26/2019  . Elevated CK 08/26/2019  . Generalized weakness 08/26/2019  . Healthcare maintenance 02/02/2011  . Smoking 02/02/2011  . 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 Hyperlipemia 01/27/2010  . LEUKOCYTOSIS 01/27/2010  . Essential hypertension, benign 10/04/2009    Expected Discharge Date: Expected Discharge Date: 09/25/19  Team Members Present: Physician leading conference: Dr. Alger Simons Care Coodinator Present: Loralee Pacas, LCSWA;Other (comment)(Nalin Mazzocco Creig Hines, RN, BSN, CRRN) Nurse Present: Judee Clara, LPN PT Present: Apolinar Junes, PT OT Present: Cherylynn Ridges, OT SLP Present: Charolett Bumpers, SLP PPS Coordinator present : Ileana Ladd, Burna Mortimer, SLP     Current Status/Progress Goal Weekly Team Focus  Bowel/Bladder   Continent at interval of Urine,Urinry retntion,  I/O for no voids with bladder scan Q8 hrs continue,po Urecoline 25 mg TID, Continent of Bowel  Maintain continence  Assess QS/PRN,,tolieting Q2 hrs   Swallow/Nutrition/ Hydration   Dys. 3 textures with thin liquids, intermittent supervision  Mod I  trials of regular textures   ADL's   Min A overall, disoriented and confused often, very tangential  Supervision  self-care retraining, cognitive  retraining, activity tolerance, dc planning   Mobility   CGA-min A for all transfers and gait 180 ft w/ RW, max verbal cues for posture, walker management  Supervision overall, 4 Steps and gait 150 ft  Balance, activity tolerance, attention, functional mobility, gait and stair training, safety awareness, patient/caregiver education   Communication             Safety/Cognition/ Behavioral Observations  Mod-Max A  Min A  basic problem solvng, attnetion, recall and awareness   Pain   no complaint occassional headachw  < 2  QS/PRN assess   Skin   skin intact,coccyx area with foam dressing,placed  secondary stage 2 red/ pinkm , bruiisng to abdomen bilaterally on SQ Hepatin injection  Maintain skin integrity, no skin breakdown  QS/PRN assessment , reposition/ turn Q2 hrs    Rehab Goals Patient on target to meet rehab goals: Yes *See Care Plan and progress notes for long and short-term goals.     Barriers to Discharge  Current Status/Progress Possible Resolutions Date Resolved   Nursing                  PT  Behavior;Home environment access/layout;Lack of/limited family support;Medication compliance  Unsure of patient's home set-up and support/assist available at d/c, patient has intermittently refused medications during stay, patient is intermittently confused with signifcant deficits in attention and safety awareness  Confirming d/c plan with CSW, initiated education about medication compliance, has had some inprovement in cognition this week           OT                  SLP  Decreased caregiver support;Lack of/limited family support              Care Coordinator Decreased caregiver support;Lack of/limited family support Son is in another location and will have to travel here. Family education will be limited.            Discharge Planning/Teaching Needs:  D/c to home with support from her son. Son is currently in [place] and states he will be the only person available to help her.   Family education as recommended by therapy   Team Discussion:  Nursing states that patient has periods of confusion and anxiety. OT spends some of sessions redirecting patient due to confusion. PT reports dizziness upon standing. TED's are being used and may have to add ace wraps to lower extremities. Son will be able to provide assistance for 6-8 weeks.  Revisions to Treatment Plan:  Pressure Injury healed at coccyx, documentation stated as such, and completed on assessment.    Medical Summary Current Status: basilar artery aneurysm with SAH. ongoing confusion with some improved. h/a better Weekly Focus/Goal: sleep, cognitive-behavioral remediation, topamax for headache  Barriers to Discharge: Behavior;Medical stability   Possible Resolutions to Barriers: nimotop for vasospasms, behavioral mgt, meds.   Continued Need for Acute Rehabilitation Level of Care: The patient requires daily medical management by a physician with specialized training in physical medicine and rehabilitation for the following reasons: Direction of a multidisciplinary physical rehabilitation program to maximize functional independence : Yes Medical management of patient stability for increased activity during participation in an intensive rehabilitation regime.: Yes Analysis of laboratory values and/or radiology reports with any subsequent need for medication adjustment and/or medical intervention. : Yes   I attest that I was present, lead the team conference, and concur with the assessment and plan of the team.   Cristi Loron 09/15/2019, 4:20 PM

## 2019-09-15 NOTE — Progress Notes (Addendum)
Occupational Therapy Session Note  Patient Details  Name: Haley Mayo MRN: [id number] Date of Birth: Oct 01, 1950  Today's Date: 09/15/2019  Session 1 OT Individual Time: 3716-9678 OT Individual Time Calculation (min): 60 min   Session 2 OT Individual Time: 1102-1200 OT Individual Time Calculation (min): 58 min    Short Term Goals: Week 1:  OT Short Term Goal 1 (Week 1): Pt will perform toilet transfers consistently with Min A OT Short Term Goal 2 (Week 1): Pt will thread BLEs into pants with supervision OT Short Term Goal 3 (Week 1): Pt will sequence bathing 11/11 body parts with min verbal cues OT Short Term Goal 4 (Week 1): Pt will perform clothing management with Min A for dynamic balance during toileting  Skilled Therapeutic Interventions/Progress Updates:  Session 1   Pt greeted semi-reclined in bed. Pt reporting being confused about paperwork she is waiting for and trying to figure out what her insurance covers. OT redirected pt with therapy schedule. OT wrote out therapy schedule in larger print and reviewed with pt. Pt disoriented to time and place, but was able to look at clock to get the time when cued. Pt completed stand-pivot to wc with min A. Worked on standing balance/endurance with standing grooming task at the sink. Pt continues to be tangetial and confused as to where she is or why she is here. OT worked on timeline with pt that was started by speech therapist. OT reviewed hospital events with pt still having a hard time understanding time frame. OT printed out May calendar and had pt write these events on calendar. Pt did well with visual representation of time and sequence of events. Pt returned to room and pivoted back to bed with min A. Pt left semi-reclined in bed with bed alarm on, call bell in reach, and needs met.   Session 2 Pt greeted semi-reclined in bed getting meds from nursing. Educated pt on modified technique to don TED hose using friction reducing  bag. Pt confused as to why she needed to wear TEDs. OT provided extensive education on circulation and relation to blood pressure. Pt agreeable to wear TED hose. Pt needed min A to don in bed. Pt ambulated from bed to wc with CGA and RW. Pt brought down to therapy gym in wc, then transferred to therapy mat with min A. Worked on standing balance/endurance while standing on foam mat. Pt felt very unsteady without UE support with OT providing mod A initially for standing balance. Pt able to stabilize LEs and could participate in simple card matching activity. Pt reported feeling dizzy and returned to supine to rest. Pt felt better and sat back up. She tolerated standing on foam 2 more x for 1 minute intervals. Pt returned to room and left seated in wc with alarm belt on, call bell in reach, and needs met.    Therapy Documentation Precautions:  Precautions Precautions: Fall Precaution Comments: impulsive, L lateral lean Restrictions Weight Bearing Restrictions: No Pain:   denies pain  Therapy/Group: Individual Therapy  [name] 09/15/2019, 12:35 PM

## 2019-09-15 NOTE — Progress Notes (Signed)
Physical Therapy Session Note  Patient Details  Name: Haley Mayo MRN: [id number] Date of Birth: [birth year]  Today's Date: 09/15/2019 PT Individual Time: [id number] PT Individual Time Calculation (min): 58 min   Short Term Goals: Week 1:  PT Short Term Goal 1 (Week 1): Pt will transfer sup to sit w/ min A . PT Short Term Goal 2 (Week 1): Pt will transfer sit to stand and SPT  w/ min A PT Short Term Goal 3 (Week 1): Pt will amb w/ RW and min A up to 50'.  Skilled Therapeutic Interventions/Progress Updates:     Session 1: Patient in bed upon PT arrival. Patient alert and agreeable to PT session. Patient denied pain during session. She reported that she had completed a time-line of her hospital stay with Benjamine Mola, OT in a prior session this morning. Asked patient to review the time-line following the calendar filled out by her and the OT. Patient able to follow the time-line using the calendar in sequential order and recalled additional details provided by OT. Answered patient's questions about procedures performed and reason for transferring from [facility] to [facility]. Patient was unable to identify that she was on the rehab unit at [facility]. Provided education/orientation to rehab including services provided, rehab schedule, and goals of rehab. Patient appreciative of review and education. Patient then ambulated from her room to the gym, >150 ft, with min A-CGA for AD management and balance. Continues to ambulated with significant forward flexed posture and shuffling gait, improved greatly with cues and demonstration today. Patient reported feeling dizzy and requested to sit. Dizziness resolved <1 min in sitting. Noted patient was not wearing TED hose and transported patient back to her room via w/c with total A. Patient requested to lie down, performed stand pivot w/c>bed with min A using HHA and performed sit to supine with supervision and increased time. Vitals: BP 116/64, HR 65 in  semi-fowlers position in the bed, dizziness resolved, RN and MD notified in team conference.    Therapy Documentation Precautions:  Precautions Precautions: Fall Precaution Comments: impulsive, L lateral lean Restrictions Weight Bearing Restrictions: No    Therapy/Group: Individual Therapy  Yuliza Cara L Sharetta Ricchio PT, DPT  09/15/2019, 7:34 PM

## 2019-09-15 NOTE — Progress Notes (Signed)
Patient ID: Haley Mayo, female   DOB: 06-Jul-1950, 69 y.o.   MRN: [id number] Pressure Injury on Coccyx documented as a Stage 2 was observed by this RN and noted to have healed. It has been completed in the assessment documentation.  Dorthula Nettles, RN, BSN, CBIS, CRRN, [employer]

## 2019-09-16 ENCOUNTER — Inpatient Hospital Stay (HOSPITAL_COMMUNITY): Payer: Medicare Other | Admitting: Speech Pathology

## 2019-09-16 ENCOUNTER — Inpatient Hospital Stay (HOSPITAL_COMMUNITY): Payer: Medicare Other | Admitting: Occupational Therapy

## 2019-09-16 ENCOUNTER — Inpatient Hospital Stay (HOSPITAL_COMMUNITY): Payer: Medicare Other

## 2019-09-16 LAB — GLUCOSE, CAPILLARY
Glucose-Capillary: 101 mg/dL — ABNORMAL HIGH (ref 70–99)
Glucose-Capillary: 131 mg/dL — ABNORMAL HIGH (ref 70–99)
Glucose-Capillary: 95 mg/dL (ref 70–99)
Glucose-Capillary: 102 mg/dL — ABNORMAL HIGH (ref 70–99)

## 2019-09-16 NOTE — Progress Notes (Signed)
Physical Therapy Session Note  Patient Details  Name: Haley Mayo MRN: [id number] Date of Birth: 1950/11/28  Today's Date: 09/16/2019 PT Individual Time: 0900-1023 PT Individual Time Calculation (min): 83 min   Short Term Goals: Week 1:  PT Short Term Goal 1 (Week 1): Pt will transfer sup to sit w/ min A . PT Short Term Goal 2 (Week 1): Pt will transfer sit to stand and SPT  w/ min A PT Short Term Goal 3 (Week 1): Pt will amb w/ RW and min A up to 50'.  Skilled Therapeutic Interventions/Progress Updates:     Session 1: Patient in bed upon PT arrival. Patient alert and agreeable to PT session. Patient denied pain during session.  Patient orthostatic and symptomatic with mobility during session. RN made aware and asking for orders for an abdominal binder per discussion.  Orthostatic vitals (thigh high TEDs donned): Sitting: BP 117/57, HR 78 (asymptomatic) Standing: BP 87/50, HR 88 (symptomatic)  Othrostatic vitals (thigh high TEDs and B LE ACE wraps donned): Supine: BP 108/57, HR 68 (asymptomatic) Sitting: BP 91/56, HR 69 (asymptomatic) Standing: BP 126/54, HR 81 (asymptomatic) Standing x3 min: BP 115/57, HR 83 (asymptomatic) Walking 50 feet: BP 106/46, HR 91 (asymptomatic)  Therapeutic Activity: Bed Mobility: Patient performed supine to/from sit x3 with with supervision with HOB 30 degrees and use of bed rail. Donned B TED hose with mod-max A and B non-skid socks with set-up assist sitting EOB. PT applied 2-4" ACE wraps to B LEs with patient in supine following first gait trial due to symptomatic OH, see details above and below.  Transfers: Patient performed sit to/from stand x5 with CGA-min A using the RW. Provided verbal cues for hand placement on RW and reaching back to sit for safety with hand-over hand assist x2. Also performed sit to/fom stand and stand pivot without an AD with min A.  Gait Training:  Patient ambulated 35 feet, limited by symptomatic OH, see fist set  of vitals above. She then ambulated 50 feet with 1 180 degree trun, initiated without use of AD with min A, but patient with poor safety awareneess reaching for furniture in the room and provided RW after 5 feet, patient was asymptomatic after second trial. Ambulated with forward trunk flexion and shuffling gait requiring min A for AD management. Provided verbal cues for staying close to the RW, erect posture, and increased step height for safety. Patient's gait improves with cues, however, does not appear to carry over cues between sessions.  Patient in bed at end of session with breaks locked, bed alarm set, and all needs within reach. Patient requesting to use the bathroom at end of session and starting to get OOB on her own. PT assisted patient to the bathroom for safety, ambulating with the RW as above. Performed toilet transfers with CGA-close supervision. Provided close supervision during toileting for safety due to patient's impulsivity. She required set-up assist for peri-care and independently performed LB clothing management during toileting. Patient was unsuccessful to void on the toilet. Educated patient about urinary retention and relaxation techniques to assist with voiding while patient was toileting. Adjusted patient's schedule to combine morning sessions to allow for toileting task at end of session. Patient returned to bed, bed mobility as above, bed alarm set, and all needs in reach. Patient declined any further mobility due to fatigue and requested to sleep. Educated on increased fatigue and benefits of sleep following stroke. Encouraged patient to work towards decreasing napping during the day  to improve quality of sleep at night as tolerated. Patient stated understanding. Patient missed 7 min of skilled PT due to fatigue, RN made aware. Will attempt to make-up missed time as able.    Therapy Documentation Precautions:  Precautions Precautions: Fall Precaution Comments: impulsive, L  lateral lean Restrictions Weight Bearing Restrictions: No General: PT Amount of Missed Time (min): 7 Minutes PT Missed Treatment Reason: Patient fatigue   Therapy/Group: Individual Therapy  Jyll Tomaro L Jasten Guyette PT, DPT  09/16/2019, 1:04 PM

## 2019-09-16 NOTE — Progress Notes (Signed)
Speech Language Pathology Daily Session Note  Patient Details  Name: Haley Mayo MRN: [id number] Date of Birth: 06-27-1950  Today's Date: 09/16/2019 SLP Individual Time: 0815-0900 SLP Individual Time Calculation (min): 45 min  Short Term Goals: Week 1: SLP Short Term Goal 1 (Week 1): Patient will demonstrate functional problem solving for basic and familiar tasks with Mod verbal cues. SLP Short Term Goal 2 (Week 1): Patient will utilize external aids to recall new, daily information with Mod verbal and visual cues. SLP Short Term Goal 3 (Week 1): Patient will demonstrate sustained attention to functional tasks for 15 minutes with Mod verbal cues for redirection. SLP Short Term Goal 4 (Week 1): Patient will demonstrate topic maintenance for ~3 turns with Mod verbal cues for redirection SLP Short Term Goal 5 (Week 1): Patient will demonstrate emergent awareness of errors during functional tasks with Mod verbal cues. SLP Short Term Goal 6 (Week 1): Patient will demonstrate efficient mastication and complete oral clearance without overt s/s of aspiration with trials of regular textures with supervision verbal cues over 2 sessions.  Skilled Therapeutic Interventions: Skilled treatment session focused on cognitive goals. Patient perseverative on medications, therefore, SLP utilized session to maximize recall of medications in order to reduce confusion. SLP initiated use of a medication list that focused on names of medications, functions of medications and what time of day to administer them. Despite the visual aid, patient remained confused, therefore, SLP created a simple chart was made divided by time of day which appeared to decrease patient's confusion. Patient apologized for intermittent frustration due to confusion. A calendar was also created to maximize recall of date. Patient left upright in bed with alarm on and all needs within reach. Continue with current plan of care.       Pain No/Denies Pain   Therapy/Group: Individual Therapy  [name] 09/16/2019, 1:08 PM

## 2019-09-16 NOTE — Progress Notes (Signed)
Occupational Therapy Session Note  Patient Details  Name: Haley Mayo MRN: [id number] Date of Birth: 15-Nov-1950  Today's Date: 09/16/2019 OT Individual Time: [id number] OT Individual Time Calculation (min): 40 min  20 missed minutes secondary to fatigue  Short Term Goals: Week 1:  OT Short Term Goal 1 (Week 1): Pt will perform toilet transfers consistently with Min A OT Short Term Goal 2 (Week 1): Pt will thread BLEs into pants with supervision OT Short Term Goal 3 (Week 1): Pt will sequence bathing 11/11 body parts with min verbal cues OT Short Term Goal 4 (Week 1): Pt will perform clothing management with Min A for dynamic balance during toileting  Skilled Therapeutic Interventions/Progress Updates:    Pt obtained from nurses station with no c/o pain this session. Max A to orient pt as she believed to be on a plane with "[employer]". Pt with very tangential speech and paranoid throughout. OT assisted pt back to room via wheelchair. Pt ambulating with RW to sink with min guard and standing for hand hygiene and oral care with min guard for balance. Pt then verbalized, " I'm so tired" and begins ambulating without AD and needing min A for safety back to bed. Sit >supine with supervision. Pt then begins talking with eyes closed and reports, " I don't know who my friends or my enemies are". OT attempting to redirect but pt becoming more tangential and difficult to redirect. BP taken with results of 135/81. OT using chart history to attempt to orient back to location,time, and situation. Pt closing eyes and reports fatigue. OT setting bed alarm for safety with call bell and all needed items within reach.  20 missed minutes secondary to fatigue.   Therapy Documentation Precautions:  Precautions Precautions: Fall Precaution Comments: impulsive, L lateral lean Restrictions Weight Bearing Restrictions: No General: General OT Amount of Missed Time: 20 Minutes PT Missed Treatment  Reason: Patient fatigue   Therapy/Group: Individual Therapy  Gypsy Decant 09/16/2019, 3:59 PM

## 2019-09-16 NOTE — Progress Notes (Signed)
Physical Therapy Session Note  Patient Details  Name: Haley Mayo MRN: [id number] Date of Birth: 1950-09-03  Today's Date: 09/16/2019 PT Individual Time: 1302-1330 PT Individual Time Calculation (min): 28 min   Short Term Goals: Week 1:  PT Short Term Goal 1 (Week 1): Pt will transfer sup to sit w/ min A . PT Short Term Goal 2 (Week 1): Pt will transfer sit to stand and SPT  w/ min A PT Short Term Goal 3 (Week 1): Pt will amb w/ RW and min A up to 50'.  Skilled Therapeutic Interventions/Progress Updates:     Pt received seated behind RN station. Agreeable to therapy. No report of pain. Pt Does not yet have abdominal binder but is wearing bilateral ted hose and ace wraps. BP in sitting 115/58. BP in standing 99/57. Pt performs sit to stand with CGA and turns to look through bag on WC, experiencing LOB to the R and requiring modA for safety. Pt then performs ambulation without AD x30' and x45' with extended seated rest break. Pt initially requiring CGA during each bout but becomes increasingly unsteady and anxious, requiring min/modA for safety using gait belt. Pt describes having "too much freedom" and this causing her to feel "off." PT cues for upright gaze and increased gait speed with pt demonstrating slight improvement in gait patterns following cues. Pt left seated in WC at RN station with alarm intact.   Therapy Documentation Precautions:  Precautions Precautions: Fall Precaution Comments: impulsive, L lateral lean Restrictions Weight Bearing Restrictions: No    Therapy/Group: Individual Therapy  Breck Coons 09/16/2019, 2:56 PM

## 2019-09-16 NOTE — Progress Notes (Signed)
[place] PHYSICAL MEDICINE & REHABILITATION PROGRESS NOTE   Subjective/Complaints: Fair night. Up in bathroom when I came in initially.   ROS: Patient denies fever, rash, sore throat, blurred vision, nausea, vomiting, diarrhea, cough, shortness of breath or chest pain,  Headache .    Objective:   No results found. No results for input(s): WBC, HGB, HCT, PLT in the last 72 hours. No results for input(s): NA, K, CL, CO2, GLUCOSE, BUN, CREATININE, CALCIUM in the last 72 hours.  Intake/Output Summary (Last 24 hours) at 09/16/2019 0829 Last data filed at 09/15/2019 2303 Gross per 24 hour  Intake 460 ml  Output 900 ml  Net -440 ml     Physical Exam: Vital Signs Blood pressure 116/71, pulse 69, temperature 97.6 F (36.4 C), resp. rate 17, height 5' (1.524 m), weight 68.1 kg, SpO2 98 %. There were no vitals taken for this visit. Physical Exam    Constitutional: No distress . Vital signs reviewed. HEENT: EOMI, oral membranes moist Neck: supple Cardiovascular: RRR without murmur. No JVD    Respiratory/Chest: CTA Bilaterally without wheezes or rales. Normal effort    GI/Abdomen: BS +, non-tender, non-distended Ext: no clubbing, cyanosis, or edema Psych: anxious, confused Skin: Stage II sacral decub not visualized today, foam dressing Neurologic: confabulates, normal language. Cranial nerves II through XII intact, motor strength is 5/5 in bilateral deltoid, bicep, tricep, grip,4-/5 bilateral  hip flexor, knee extensors, ankle dorsiflexor and plantar flexor Senses pain in all 4's.  Musculoskeletal: Full range of motion in all 4 extremities. No joint swelling    Assessment/Plan: 1. 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  which require 3+ hours per day of interdisciplinary therapy in a comprehensive inpatient rehab setting.  Physiatrist is providing close team supervision and 24 hour  management of active medical problems listed below.  Physiatrist and rehab team continue to assess barriers to discharge/monitor patient progress toward functional and medical goals  Care Tool:  Bathing    Body parts bathed by patient: Right arm, Left arm, Chest, Abdomen, Front perineal area, Buttocks, Right upper leg, Left upper leg, Face, Left lower leg, Right lower leg   Body parts bathed by helper: Right lower leg, Left lower leg     Bathing assist Assist Level: Minimal Assistance - Patient > 75%     Upper Body Dressing/Undressing Upper body dressing   What is the patient wearing?: Pull over shirt    Upper body assist Assist Level: Set up assist    Lower Body Dressing/Undressing Lower body dressing      What is the patient wearing?: Pants     Lower body assist Assist for lower body dressing: Minimal Assistance - Patient > 75%     Toileting Toileting Toileting Activity did not occur (Clothing management and hygiene only): (She will not keep diaper on)  Toileting assist Assist for toileting: Minimal Assistance - Patient > 75%     Transfers Chair/bed transfer  Transfers assist     Chair/bed transfer assist level: Minimal Assistance - Patient > 75% Chair/bed transfer assistive device: Programmer, multimedia   Ambulation assist      Assist level: Minimal Assistance - Patient > 75% Assistive device: Walker-rolling Max distance: 180'   Walk 10 feet activity   Assist     Assist level: Minimal Assistance - Patient > 75% Assistive device: Walker-rolling   Walk 50 feet activity   Assist Walk 50 feet with 2  turns activity did not occur: Safety/medical concerns  Assist level: Minimal Assistance - Patient > 75% Assistive device: Walker-rolling    Walk 150 feet activity   Assist Walk 150 feet activity did not occur: Safety/medical concerns  Assist level: Minimal Assistance - Patient > 75% Assistive device: Walker-rolling    Walk 10  feet on uneven surface  activity   Assist Walk 10 feet on uneven surfaces activity did not occur: Safety/medical concerns         Wheelchair     Assist Will patient use wheelchair at discharge?: Yes Type of Wheelchair: Manual    Wheelchair assist level: Moderate Assistance - Patient 50 - 74% Max wheelchair distance: 50    Wheelchair 50 feet with 2 turns activity    Assist        Assist Level: Moderate Assistance - Patient 50 - 74%   Wheelchair 150 feet activity     Assist  Wheelchair 150 feet activity did not occur: Safety/medical concerns       Blood pressure 116/71, pulse 69, temperature 97.6 F (36.4 C), resp. rate 17, height 5' (1.524 m), weight 68.1 kg, SpO2 98 %.  Medical Problem List and Plan: 1.  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  Status post coil embolization 09/01/2019             -patient may shower             -ELOS/Goals: 2-2.5 weeks- Supervision to Mod I  CIR PT, OT , SLP 2.  Antithrombotics: -DVT/anticoagulation: Pulmonary emboli.  Status post IVC filter 09/02/2019.  Lower extremity Dopplers negative.  Continue low-dose heparin             -antiplatelet therapy: Aspirin 325 mg and Plavix 75 mg daily 3. Pain Management: Tylenol as needed.  5/31: post-SAH headaches  Topiramate 25mg  daily seems to have helped  4. Mood: Provide emotional support             -antipsychotic agents: N/A 5. Neuropsych: This patient is capable of making decisions on her own behalf.  -telesitter for safety 6. Skin/Wound Care: Routine skin checks 7. Fluids/Electrolytes/Nutrition:    5/28: BMP stable.        -encouraged PO, not sure fluid intake accurate, eating 100%  -recheck labs THursday 8.  Cerebral vasospasm.  Continue Nimotop as directed, hold if bp too soft  -use TEDS or Abd binder if needed to support bp 9.  2.3 cm right thyroid heterogeneous nodule.  Follow-up  outpatient 10.  Tobacco abuse.  Counseling 11.  Dysphagia. Dysphagia #3 thin liquids. Follow-up speech therapy 12.  Urinary retention.     Timed toileting, oob to void  - increased urecholine to 25mg  TID on 5/31  6/2 increase urecholine to 50mg  tid   -bp cant support flomax  -bladder volumes inconistent 13. Stage II sacral decub- POA- continue foam dressing and barrier cream for now   14. Leukocytosis: 5/28: trending downward.    LOS: 6 days A FACE TO FACE EVALUATION WAS PERFORMED  Meredith Staggers 09/16/2019, 8:29 AM

## 2019-09-16 NOTE — Progress Notes (Signed)
Patient ID: Haley Mayo, female   DOB: 1950-07-27, 69 y.o.   MRN: [id number]  SW called pt son Virgel Gess [phone]) but unable to connect as voicemail box was full. SW will continue to make efforts to discuss d/c plan.  *SW made contact with pt son Kristopher. SW provided updates from team conference and d/c date 09/25/2019. He reports that he is agreeable to travelling here to [place] to assist with his mother's care needs, but she has expressed to him that she did not want him coming. He does recognize that she has some confusion at times. He states if she discharges to home, he would not be able to travel here until 6/14. He states pt has reported that she wants to go to a SNF.   *SW called pt son and pt with pt in room to discuss discharge plan. Pt now reports that she does not want her son to come here and would like to go to a SNF. SW emailed pt son SNF list.   Loralee Pacas, MSW, [place] Office: [phone] Cell: [phone] Fax: [phone]

## 2019-09-17 ENCOUNTER — Inpatient Hospital Stay (HOSPITAL_COMMUNITY): Payer: Medicare Other | Admitting: Speech Pathology

## 2019-09-17 ENCOUNTER — Inpatient Hospital Stay (HOSPITAL_COMMUNITY): Payer: Medicare Other

## 2019-09-17 ENCOUNTER — Inpatient Hospital Stay (HOSPITAL_COMMUNITY): Payer: Medicare Other | Admitting: Occupational Therapy

## 2019-09-17 LAB — GLUCOSE, CAPILLARY
Glucose-Capillary: 106 mg/dL — ABNORMAL HIGH (ref 70–99)
Glucose-Capillary: 102 mg/dL — ABNORMAL HIGH (ref 70–99)
Glucose-Capillary: 87 mg/dL (ref 70–99)
Glucose-Capillary: 119 mg/dL — ABNORMAL HIGH (ref 70–99)

## 2019-09-17 LAB — BASIC METABOLIC PANEL
Anion gap: 11 (ref 5–15)
BUN: 31 mg/dL — ABNORMAL HIGH (ref 8–23)
CO2: 19 mmol/L — ABNORMAL LOW (ref 22–32)
Calcium: 9.7 mg/dL (ref 8.9–10.3)
Chloride: 110 mmol/L (ref 98–111)
Creatinine, Ser: 0.93 mg/dL (ref 0.44–1.00)
GFR calc Af Amer: >60 mL/min (ref >60)
GFR calc non Af Amer: >60 mL/min (ref >60)
Glucose, Bld: 98 mg/dL (ref 70–99)
Potassium: 4.2 mmol/L (ref 3.5–5.1)
Sodium: 140 mmol/L (ref 135–145)

## 2019-09-17 NOTE — Progress Notes (Signed)
Patient ID: Haley Mayo, female   DOB: [birth year], 69 y.o.   MRN: [id number]   SW discussed with pt SNF locations and provided list. Pt states that she is now not going to SNF she is going to [facility] with her son. SW left SNF list in room.   Loralee Pacas, MSW, [place] Office: [phone] Cell: [phone] Fax: [phone]

## 2019-09-17 NOTE — Progress Notes (Signed)
Physical Therapy Session Note  Patient Details  Name: Haley Mayo MRN: [id number] Date of Birth: 02/12/1951  Today's Date: 09/17/2019 PT Individual Time: 1100-1145 and [phone] PT Individual Time Calculation (min): 45 min and 58min  Short Term Goals: Week 1:  PT Short Term Goal 1 (Week 1): Pt will transfer sup to sit w/ min A . PT Short Term Goal 2 (Week 1): Pt will transfer sit to stand and SPT  w/ min A PT Short Term Goal 3 (Week 1): Pt will amb w/ RW and min A up to 50'. Week 2:    Week 3:     Skilled Therapeutic Interventions/Progress Updates:   session one  PAIN denies pain Pt initially supine and receiving nursing care.  Therapist assisted pt w/donning pants w/set up, cues to attent to task.  Supine to sit w/cga using rail.  STS w/min assist for transition but mod assist for balance due to post tendency. Pt assisted w/raising pants w/mod assist for balance only. Bed to wc SPT w/min to mod assist for balance and cues for safety/sequencing. Pt transported to gym for continued session w/emphasis on dynamic balance. Pt introduced to maxi sky device.  SPT to mat w/min to mod assist. Maxi sky harness donned by therapist w/pt in sitting and in standing w/min assist for balance. Pt then STS w/min assist and harness adjusted for safety/security. Pt then worked on wt shifting in standing to decrease post tendency/increase activation of abdominals and promote midline. Pt then performed gait training, initially w/very short shuffling steps and pt gripping harness for stability.  Pt gradually increased step length, cadence and armswing w/cueing/encouragment/repetition.  Gait 9ft x 4 including turns w/min assist and cues to encourage increased step length, height, cadence, arm swing.  Pt does fatigue and require increased time resting between efforts/2 efforts completed as described.   Pt enducated re: benefits of challenging balance/balance training in safe setting of PT to improve  balance and decrease risk of falls.  Pt receptive to education.  Pt transported back to room.  Pt noted to have no alarm belt and call bell not working.  Nursing notified and pt handed off to nursing/oob in wc.    Session two PAIN Pt denies pain Pt initially supine/resting/agreeable to treatment.  Supine to sit w/min assist.  SPT to wc w/min assist. Transported to gym for session.  Pt instructed w/LiteGait/benefits/set up/use of TM    Pt agreeable to trying.  PT STS from wc using LiteGait frame w/CGA.  Harness donned in standing, returned to sitting for rest and adjustment of harness.  STS w/cga as above.  Lite Gait lift elevated to provide safety but not bodyweigth support.  Pt able to ambulate several steps to TM, steps up w/min assist.  Instructed w/TM and initiate gait trials as below:  Trial one:   77ft, @.2-.66mph for total time of 74min 45sec Pt w/limited attention to task requiring constant verbal cues to continue stepping/not stop/maintain steady cadence, increase step length esp Rvs L,   Rest in sitting w/wc on power off TM   Trial two:   64ft, @ .2-.23mph again w/constant cues as above.  Pt backs on TM and steps back to floor w/min assist, stand to sit w/cues/cga.  Harness removed by therapist and pt transported back to room.  SPT wc to bed w/min assist, scoots to hob w/min assist.  Sit to supine w/min assist. Pt left supine w/rails up x 4, alarm set, bed in lowest position, and needs  in reach.     Therapy Documentation Precautions:  Precautions Precautions: Fall Precaution Comments: impulsive, L lateral lean Restrictions Weight Bearing Restrictions: No    Therapy/Group: Individual Therapy  Callie Fielding, [place] 09/17/2019, 5:09 PM

## 2019-09-17 NOTE — Progress Notes (Signed)
Occupational Therapy Session Note  Patient Details  Name: Haley Mayo MRN: [id number] Date of Birth: Jun 04, 1950  Today's Date: 09/17/2019 OT Individual Time: [id number] OT Individual Time Calculation (min): 32 min    Short Term Goals: Week 1:  OT Short Term Goal 1 (Week 1): Pt will perform toilet transfers consistently with Min A OT Short Term Goal 2 (Week 1): Pt will thread BLEs into pants with supervision OT Short Term Goal 3 (Week 1): Pt will sequence bathing 11/11 body parts with min verbal cues OT Short Term Goal 4 (Week 1): Pt will perform clothing management with Min A for dynamic balance during toileting  Skilled Therapeutic Interventions/Progress Updates:    Patient in bed, alert, min cues for date.  She denies pain.  Completes bed mobility with CS.  Sit to stand and ambulation with RW to/from bed to toilet and w/c with CGA.  She requires cues for safety.  toileting tasks and handwashing in stance completed with CS/CGA.  Completed standing balance activities with CGA and encouragement to complete activity once initiated.  She returned to bed at close of session with CS.  Bed alarm set, telesitter in room.  Call bell and phone in reach.    Therapy Documentation Precautions:  Precautions Precautions: Fall Precaution Comments: impulsive, L lateral lean Restrictions Weight Bearing Restrictions: No  Therapy/Group: Individual Therapy  Carlos Levering 09/17/2019, 7:42 AM

## 2019-09-17 NOTE — Progress Notes (Signed)
Speech Language Pathology Daily Session Note  Patient Details  Name: Haley Mayo MRN: [id number] Date of Birth: [birth year]  Today's Date: 09/17/2019 SLP Individual Time: 0730-0830 SLP Individual Time Calculation (min): 60 min  Short Term Goals: Week 1: SLP Short Term Goal 1 (Week 1): Patient will demonstrate functional problem solving for basic and familiar tasks with Mod verbal cues. SLP Short Term Goal 2 (Week 1): Patient will utilize external aids to recall new, daily information with Mod verbal and visual cues. SLP Short Term Goal 3 (Week 1): Patient will demonstrate sustained attention to functional tasks for 15 minutes with Mod verbal cues for redirection. SLP Short Term Goal 4 (Week 1): Patient will demonstrate topic maintenance for ~3 turns with Mod verbal cues for redirection SLP Short Term Goal 5 (Week 1): Patient will demonstrate emergent awareness of errors during functional tasks with Mod verbal cues. SLP Short Term Goal 6 (Week 1): Patient will demonstrate efficient mastication and complete oral clearance without overt s/s of aspiration with trials of regular textures with supervision verbal cues over 2 sessions.  Skilled Therapeutic Interventions: Skilled treatment session focused on dysphagia and cognitive goals. SLP facilitated session by providing skilled observation with breakfast meal of Dys. 3 textures with thin liquids. Patient consumed meal without overt s/s of aspiration and required supervision verbal cues for appropriate postioning during PO intake to maximize safety. Throughout meal, patient with intermittent language of confusion and confabulations, however, patient was easily redirected but required Min verbal cues for sustained attention to self-feeding. Patient requested a folder to maximize organization of external aids, therefore, patient provided one. SLP also facilitated session by providing extra time and supervision level verbal cues for basic problem  solving during a money management task. Patient left upright in bed with alarm on and all needs within reach. Continue with current plan of care.      Pain Pain Assessment Pain Scale: 0-10 Pain Score: 0-No pain  Therapy/Group: Individual Therapy  Stephaun Million 09/17/2019, 12:23 PM

## 2019-09-17 NOTE — Progress Notes (Signed)
[facility] PHYSICAL MEDICINE & REHABILITATION PROGRESS NOTE   Subjective/Complaints: Up in bed with SLP. No obvious complaints. Worried about son coming back from [place] to [place].   ROS: Limited due to cognitive/behavioral    Objective:   No results found. No results for input(s): WBC, HGB, HCT, PLT in the last 72 hours. Recent Labs    09/17/19 0552  NA 140  K 4.2  CL 110  CO2 19*  GLUCOSE 98  BUN 31*  CREATININE 0.93  CALCIUM 9.7    Intake/Output Summary (Last 24 hours) at 09/17/2019 1006 Last data filed at 09/17/2019 0900 Gross per 24 hour  Intake 380 ml  Output 1600 ml  Net -1220 ml     Physical Exam: Vital Signs Blood pressure (!) 109/57, pulse 75, temperature 98.5 F (36.9 C), temperature source Oral, resp. rate 16, height 5' (1.524 m), weight 68 kg, SpO2 98 %. There were no vitals taken for this visit. Physical Exam  Constitutional: No distress . Vital signs reviewed. HEENT: EOMI, oral membranes moist Neck: supple Cardiovascular: RRR without murmur. No JVD    Respiratory/Chest: CTA Bilaterally without wheezes or rales. Normal effort    GI/Abdomen: BS +, non-tender, non-distended Ext: no clubbing, cyanosis, or edema Psych: pleasant but anxious, confused Skin: Stage II sacral decub dressed. Neurologic: confabulates frequently, confuses information presented to her, normal language. Cranial nerves II through XII intact, motor strength is 5/5 in bilateral deltoid, bicep, tricep, grip,4-/5 bilateral  hip flexor, knee extensors, ankle dorsiflexor and plantar flexor Normal sensation  Musculoskeletal: normal ROM, no swelling    Assessment/Plan: 1. 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  which require 3+ hours per day of interdisciplinary therapy in a comprehensive inpatient rehab setting.  Physiatrist is providing close team supervision and 24 hour management of  active medical problems listed below.  Physiatrist and rehab team continue to assess barriers to discharge/monitor patient progress toward functional and medical goals  Care Tool:  Bathing    Body parts bathed by patient: Right arm, Left arm, Chest, Abdomen, Front perineal area, Buttocks, Right upper leg, Left upper leg, Face, Left lower leg, Right lower leg   Body parts bathed by helper: Right lower leg, Left lower leg     Bathing assist Assist Level: Minimal Assistance - Patient > 75%     Upper Body Dressing/Undressing Upper body dressing   What is the patient wearing?: Pull over shirt    Upper body assist Assist Level: Set up assist    Lower Body Dressing/Undressing Lower body dressing      What is the patient wearing?: Pants     Lower body assist Assist for lower body dressing: Minimal Assistance - Patient > 75%     Toileting Toileting Toileting Activity did not occur (Clothing management and hygiene only): (She will not keep diaper on)  Toileting assist Assist for toileting: Minimal Assistance - Patient > 75%     Transfers Chair/bed transfer  Transfers assist     Chair/bed transfer assist level: Minimal Assistance - Patient > 75% Chair/bed transfer assistive device: Other(no device)   Locomotion Ambulation   Ambulation assist      Assist level: Moderate Assistance - Patient 50 - 74% Assistive device: No Device Max distance: 45'   Walk 10 feet activity   Assist     Assist level: Minimal Assistance - Patient > 75% Assistive device: No Device   Walk 50 feet activity  Assist Walk 50 feet with 2 turns activity did not occur: Safety/medical concerns  Assist level: Minimal Assistance - Patient > 75% Assistive device: Walker-rolling    Walk 150 feet activity   Assist Walk 150 feet activity did not occur: Safety/medical concerns  Assist level: Minimal Assistance - Patient > 75% Assistive device: Walker-rolling    Walk 10 feet on  uneven surface  activity   Assist Walk 10 feet on uneven surfaces activity did not occur: Safety/medical concerns         Wheelchair     Assist Will patient use wheelchair at discharge?: Yes Type of Wheelchair: Manual    Wheelchair assist level: Moderate Assistance - Patient 50 - 74% Max wheelchair distance: 50    Wheelchair 50 feet with 2 turns activity    Assist        Assist Level: Moderate Assistance - Patient 50 - 74%   Wheelchair 150 feet activity     Assist  Wheelchair 150 feet activity did not occur: Safety/medical concerns       Blood pressure (!) 109/57, pulse 75, temperature 98.5 F (36.9 C), temperature source Oral, resp. rate 16, height 5' (1.524 m), weight 68 kg, SpO2 98 %.  Medical Problem List and Plan: 1.  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  Status post coil embolization 09/01/2019             -patient may shower             -ELOS/Goals: 2-2.5 weeks- Supervision to Mod I  CIR PT, OT , SLP 2.  Antithrombotics: -DVT/anticoagulation: Pulmonary emboli.  Status post IVC filter 09/02/2019.  Lower extremity Dopplers negative.  Continue low-dose heparin             -antiplatelet therapy: Aspirin 325 mg and Plavix 75 mg daily 3. Pain Management: Tylenol as needed.    Topiramate 25mg  daily seems to have helped headaches  -using percocet intermittently as well though 4. Mood: Provide emotional support             -antipsychotic agents: N/A 5. Neuropsych: This patient is capable of making decisions on her own behalf.  -telesitter for safety 6. Skin/Wound Care: Routine skin checks 7. Fluids/Electrolytes/Nutrition:    6/3 BUN increased to 30        -need to push fluids, eating 100%  -recheck labs again tomorrow 8.  Cerebral vasospasm.  Continue Nimotop as directed, hold if bp too soft  -use TEDS or Abd binder if needed to support bp   -improve volume status 9.  2.3  cm right thyroid heterogeneous nodule.  Follow-up outpatient 10.  Tobacco abuse.  Counseling 11.  Dysphagia. Dysphagia #3 thin liquids. Follow-up speech therapy 12.  Urinary retention.     Timed toileting, oob to void  - increased urecholine to 25mg  TID on 5/31  6/2 increased urecholine to 50mg  tid   -bp can't support flomax  6/3 cath volumes a little less. Still not voiding though 13. Stage II sacral decub- POA- continue foam dressing and barrier cream for now   14. Leukocytosis: 5/28: trending downward to 14k.    LOS: 7 days A FACE TO FACE EVALUATION WAS PERFORMED  Meredith Staggers 09/17/2019, 10:06 AM

## 2019-09-18 ENCOUNTER — Inpatient Hospital Stay (HOSPITAL_COMMUNITY): Payer: Medicare Other

## 2019-09-18 ENCOUNTER — Inpatient Hospital Stay (HOSPITAL_COMMUNITY): Payer: Medicare Other | Admitting: Speech Pathology

## 2019-09-18 ENCOUNTER — Inpatient Hospital Stay (HOSPITAL_COMMUNITY): Payer: Medicare Other | Admitting: Physical Therapy

## 2019-09-18 LAB — BASIC METABOLIC PANEL
Anion gap: 11 (ref 5–15)
BUN: 22 mg/dL (ref 8–23)
CO2: 18 mmol/L — ABNORMAL LOW (ref 22–32)
Calcium: 9.4 mg/dL (ref 8.9–10.3)
Chloride: 111 mmol/L (ref 98–111)
Creatinine, Ser: 0.85 mg/dL (ref 0.44–1.00)
GFR calc Af Amer: >60 mL/min (ref >60)
GFR calc non Af Amer: >60 mL/min (ref >60)
Glucose, Bld: 107 mg/dL — ABNORMAL HIGH (ref 70–99)
Potassium: 4.1 mmol/L (ref 3.5–5.1)
Sodium: 140 mmol/L (ref 135–145)

## 2019-09-18 LAB — GLUCOSE, CAPILLARY
Glucose-Capillary: 117 mg/dL — ABNORMAL HIGH (ref 70–99)
Glucose-Capillary: 91 mg/dL (ref 70–99)

## 2019-09-18 NOTE — Progress Notes (Signed)
Occupational Therapy Session Note  Patient Details  Name: Haley Mayo MRN: [id number] Date of Birth: 01/05/1951  Today's Date: 09/18/2019 OT Individual Time: 6147-0929 OT Individual Time Calculation (min): 30 min    Short Term Goals: Week 1:  OT Short Term Goal 1 (Week 1): Pt will perform toilet transfers consistently with Min A OT Short Term Goal 2 (Week 1): Pt will thread BLEs into pants with supervision OT Short Term Goal 3 (Week 1): Pt will sequence bathing 11/11 body parts with min verbal cues OT Short Term Goal 4 (Week 1): Pt will perform clothing management with Min A for dynamic balance during toileting  Skilled Therapeutic Interventions/Progress Updates:    Pt resting in bed upon arrival.  Pt oriented to month but repeated several times that the year was 1971.  Pt finally agreed to 2021.  Pt aware that she was in hospital in [place], [place] but could not figure out how she was in [place] since she lived in [place].  Continued discussion regarding her history helped pt recognize that she was living in [place] because she moved here for [place]. OT intervention with focus on bed mobility, sit<>stand, standing balance, and safety awareness to increase independence with BADLs. Supine<>sit EOB with supervision.  Sit<>stand and standng balance with min A. Pt returned to supine and remained in bed with all needs within reach and bed alarm activated.   Therapy Documentation Precautions:  Precautions Precautions: Fall Precaution Comments: impulsive, L lateral lean Restrictions Weight Bearing Restrictions: No Pain:  Pt denies pain this morning   Therapy/Group: Individual Therapy  Leroy Libman 09/18/2019, 12:07 PM

## 2019-09-18 NOTE — Progress Notes (Signed)
Physical Therapy Session Note  Patient Details  Name: Haley Mayo MRN: [id number] Date of Birth: 1950-09-29  Today's Date: 09/18/2019 PT Individual Time: 0902-1000 PT Individual Time Calculation (min): 58 min   Short Term Goals: Week 1:  PT Short Term Goal 1 (Week 1): Pt will transfer sup to sit w/ min A . PT Short Term Goal 2 (Week 1): Pt will transfer sit to stand and SPT  w/ min A PT Short Term Goal 3 (Week 1): Pt will amb w/ RW and min A up to 50'.  Skilled Therapeutic Interventions/Progress Updates: Pt presents sitting in w/c, agreeable to thaerapy.  Pt does not have TEDs on or ace wraps.  No abd binder in room.  Pt performed SPT w/c to bed w/ CGA to occasional min A for safety.  Pt transferred sit to supine w/ supervision.  Total assist to don TEDS and ace wraps.  Pt required min A for supine to sit and scooted to EOB..  Pt attempting to achieve Figure-4 position to don slipper socks, but requires manual assist to maintain from PT.  Pt performed sit to stand  W/ min A and SPT bed > w/c.  Pt negotiated w/c in hallways using BUEs and LEs w/ verbal and occasional man cues to avoid right side.  Pt amb multiple trials w/o AD and CGA up to 40'.   Pt amb w/ improved posture, but does require verbal cues for speed and safety.  Pt performed standing stacking and unstacking cones using BUEs simultaneously, including stance w/ decreased BOS and on cushioned surface.  Pt returned to room and remained in w/c w/ chair alarm on and needs in reach.     Therapy Documentation Precautions:  Precautions Precautions: Fall Precaution Comments: impulsive, L lateral lean Restrictions Weight Bearing Restrictions: No General:   Vital Signs:   Pain:no c/o      Therapy/Group: Individual Therapy  [name] 09/18/2019, 11:22 AM

## 2019-09-18 NOTE — Progress Notes (Signed)
Speech Language Pathology Weekly Progress and Session Note  Patient Details  Name: Haley Mayo MRN: [id number] Date of Birth: 06-05-1950  Beginning of progress report period: Sep 11, 2019 End of progress report period: September 18, 2019  Today's Date: 09/18/2019 SLP Individual Time: 1300-1355 SLP Individual Time Calculation (min): 55 min  Short Term Goals: Week 1: SLP Short Term Goal 1 (Week 1): Patient will demonstrate functional problem solving for basic and familiar tasks with Mod verbal cues. SLP Short Term Goal 1 - Progress (Week 1): Met SLP Short Term Goal 2 (Week 1): Patient will utilize external aids to recall new, daily information with Mod verbal and visual cues. SLP Short Term Goal 2 - Progress (Week 1): Not met SLP Short Term Goal 3 (Week 1): Patient will demonstrate sustained attention to functional tasks for 15 minutes with Mod verbal cues for redirection. SLP Short Term Goal 3 - Progress (Week 1): Met SLP Short Term Goal 4 (Week 1): Patient will demonstrate topic maintenance for ~3 turns with Mod verbal cues for redirection SLP Short Term Goal 4 - Progress (Week 1): Met SLP Short Term Goal 5 (Week 1): Patient will demonstrate emergent awareness of errors during functional tasks with Mod verbal cues. SLP Short Term Goal 5 - Progress (Week 1): Not met SLP Short Term Goal 6 (Week 1): Patient will demonstrate efficient mastication and complete oral clearance without overt s/s of aspiration with trials of regular textures with supervision verbal cues over 2 sessions. SLP Short Term Goal 6 - Progress (Week 1): Not met    New Short Term Goals: Week 2: SLP Short Term Goal 1 (Week 2): STGs=LTGs due to ELOS  Weekly Progress Updates: Patient has made functional but inconsistent gains and has met 3 of 6 STGs this reporting period. Currently, patient is consuming dys. 3 textures with thin liquids without overt s/s of aspiration with overall Mod I for use of swallowing compensatory  strategies. Trials of regular textures will be addressed during this upcoming reporting period. Patient requires overall Mod A verbal cues for sustained attention, basic problem solving and topic maintenance during functional tasks. However, she continues to require overall Max A verbal cues for recall of functional information and emergent awareness with intermittent confusion noted throughout the day. Patient education ongoing. Patient would benefit from continued skilled SLP intervention to maximize her cognitive functioning and overall functional independence prior to discharge.      Intensity: Minumum of 1-2 x/day, 30 to 90 minutes Frequency: 3 to 5 out of 7 days Duration/Length of Stay: 6/11 Treatment/Interventions: Cognitive remediation/compensation;Dysphagia/aspiration precaution training;Internal/external aids;Therapeutic Activities;Environmental controls;Cueing hierarchy;Functional tasks;Patient/family education   Daily Session  Skilled Therapeutic Interventions: Skilled treatment session focused on cognitive goals. SLP facilitated session by providing overall Mod A verbal cues for patient to generate a list of both cognitive and physical deficits since admission. SLP also provided a list of "goals" that each therapist is working on. A memory notebook was provided that listed all of this information in order to maximize recall and carryover. Patient also organized a 3x/day pill box with overall Min A verbal and visual cues after demonstration from SLP in order to minimize confusion. Patient left upright in the wheelchair with the alarm on at the RN station. Continue with current plan of care.       Pain No/Denies Pain   Therapy/Group: Individual Therapy  Garin Mata 09/18/2019, 12:42 PM

## 2019-09-18 NOTE — Progress Notes (Signed)
Occupational Therapy Session Note  Patient Details  Name: Haley Mayo MRN: [id number] Date of Birth: 03-Feb-1951  Today's Date: 09/18/2019 OT Individual Time: 3491-7915 OT Individual Time Calculation (min): 58 min    Short Term Goals: Week 1:  OT Short Term Goal 1 (Week 1): Pt will perform toilet transfers consistently with Min A OT Short Term Goal 2 (Week 1): Pt will thread BLEs into pants with supervision OT Short Term Goal 3 (Week 1): Pt will sequence bathing 11/11 body parts with min verbal cues OT Short Term Goal 4 (Week 1): Pt will perform clothing management with Min A for dynamic balance during toileting  Skilled Therapeutic Interventions/Progress Updates:    1:1. Pt received at RN station. Pt unable ot recall any events of the day requiring MAX A for using memory notebook to recall therapy. Pt reporting need to toilet and requires CGA for all transfers with and without AD and VC for close proximity to RW. Pt able ot complete 3/3 components of toileting with CGA for stanidng balance and VC for correctin L lean in sitting for hygiene. Pt completes 4x1 min dynavision task with rest breaks in between trials with 1.7 rxn RUE, 2.2 avg rxn LUE, and 3.3 second average rxn time during dual task/multi colored task RUE-red targets/LUE green targets. 1st trial pt unable to follow. Second trial of multiple colors pt with only 1 error. Education on slower reaction time during dual task implications. Pt completes seated recipe task requiring overall MAX direct VC for referring to recipe to locate needed items following recipe card. Exited session with pt seated in bed, exit alarm on and call light in reach  Therapy Documentation Precautions:  Precautions Precautions: Fall Precaution Comments: impulsive, L lateral lean Restrictions Weight Bearing Restrictions: No General:   Vital Signs: Therapy Vitals Temp: 98.7 F (37.1 C) Pulse Rate: 68 Resp: 16 BP: 119/60 Patient Position (if  appropriate): Lying Oxygen Therapy SpO2: 98 % O2 Device: Room Air Pain:   ADL:   Vision   Perception    Praxis   Exercises:   Other Treatments:     Therapy/Group: Individual Therapy  Tonny Branch 09/18/2019, 7:29 AM

## 2019-09-18 NOTE — Progress Notes (Signed)
[place] [facility] PHYSICAL MEDICINE & REHABILITATION PROGRESS NOTE   Subjective/Complaints: Up in chair, organizing her personal belongings  ROS: Limited due to cognitive/behavioral    Objective:   No results found. No results for input(s): WBC, HGB, HCT, PLT in the last 72 hours. Recent Labs    09/17/19 0552 09/18/19 0639  NA 140 140  K 4.2 4.1  CL 110 111  CO2 19* 18*  GLUCOSE 98 107*  BUN 31* 22  CREATININE 0.93 0.85  CALCIUM 9.7 9.4    Intake/Output Summary (Last 24 hours) at 09/18/2019 1236 Last data filed at 09/18/2019 0700 Gross per 24 hour  Intake 120 ml  Output 650 ml  Net -530 ml     Physical Exam: Vital Signs Blood pressure 119/60, pulse 68, temperature 98.7 F (37.1 C), resp. rate 16, height 5' (1.524 m), weight 68 kg, SpO2 98 %. Constitutional: No distress . Vital signs reviewed. HEENT: EOMI, oral membranes moist Neck: supple Cardiovascular: RRR without murmur. No JVD    Respiratory/Chest: CTA Bilaterally without wheezes or rales. Normal effort    GI/Abdomen: BS +, non-tender, non-distended Ext: no clubbing, cyanosis, or edema Psych: pleasant but confused Skin: Stage II sacral decub decreasing. Neurologic: confabulates, often distracted.  Cranial nerves II through XII intact, motor strength is 5/5 in bilateral deltoid, bicep, tricep, grip,4-/5 bilateral  hip flexor, knee extensors, ankle dorsiflexor and plantar flexor Normal sensation  Musculoskeletal: normal ROM, no swelling    Assessment/Plan: 1. 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  which require 3+ hours per day of interdisciplinary therapy in a comprehensive inpatient rehab setting.  Physiatrist is providing close team supervision and 24 hour management of active medical problems listed below.  Physiatrist and rehab team continue to assess barriers to discharge/monitor patient progress toward  functional and medical goals  Care Tool:  Bathing    Body parts bathed by patient: Right arm, Left arm, Chest, Abdomen, Front perineal area, Buttocks, Right upper leg, Left upper leg, Face, Left lower leg, Right lower leg   Body parts bathed by helper: Right lower leg, Left lower leg     Bathing assist Assist Level: Minimal Assistance - Patient > 75%     Upper Body Dressing/Undressing Upper body dressing   What is the patient wearing?: Pull over shirt    Upper body assist Assist Level: Set up assist    Lower Body Dressing/Undressing Lower body dressing      What is the patient wearing?: Pants     Lower body assist Assist for lower body dressing: Minimal Assistance - Patient > 75%     Toileting Toileting Toileting Activity did not occur (Clothing management and hygiene only): (She will not keep diaper on)  Toileting assist Assist for toileting: Contact Guard/Touching assist     Transfers Chair/bed transfer  Transfers assist     Chair/bed transfer assist level: Contact Guard/Touching assist Chair/bed transfer assistive device: Other(no device)   Locomotion Ambulation   Ambulation assist      Assist level: Contact Guard/Touching assist Assistive device: No Device Max distance: 40'   Walk 10 feet activity   Assist     Assist level: Contact Guard/Touching assist Assistive device: Maxi Sky   Walk 50 feet activity   Assist Walk 50 feet with 2 turns activity did not occur: Safety/medical concerns  Assist level: Contact Guard/Touching assist Assistive device: Maxi Sky    Walk 150 feet activity   Assist Walk 150  feet activity did not occur: Safety/medical concerns  Assist level: Minimal Assistance - Patient > 75% Assistive device: Walker-rolling    Walk 10 feet on uneven surface  activity   Assist Walk 10 feet on uneven surfaces activity did not occur: Safety/medical concerns         Wheelchair     Assist Will patient use  wheelchair at discharge?: Yes Type of Wheelchair: Manual    Wheelchair assist level: Moderate Assistance - Patient 50 - 74% Max wheelchair distance: 50    Wheelchair 50 feet with 2 turns activity    Assist        Assist Level: Moderate Assistance - Patient 50 - 74%   Wheelchair 150 feet activity     Assist  Wheelchair 150 feet activity did not occur: Safety/medical concerns       Blood pressure 119/60, pulse 68, temperature 98.7 F (37.1 C), resp. rate 16, height 5' (1.524 m), weight 68 kg, SpO2 98 %.  Medical Problem List and Plan: 1.  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  Status post coil embolization 09/01/2019             -patient may shower             -ELOS/Goals: 2-2.5 weeks- Supervision to Mod I  CIR PT, OT , SLP  -reaching out to son today to discuss status, prognosis, care needs 2.  Antithrombotics: -DVT/anticoagulation: Pulmonary emboli.  Status post IVC filter 09/02/2019.  Lower extremity Dopplers negative.  Continue low-dose heparin             -antiplatelet therapy: Aspirin 325 mg and Plavix 75 mg daily 3. Pain Management: Tylenol as needed.    Topiramate 25mg  daily seems to have helped headaches  -using percocet intermittently   4. Mood: Provide emotional support             -antipsychotic agents: N/A 5. Neuropsych: This patient is capable of making decisions on her own behalf.  -telesitter for safety 6. Skin/Wound Care: Routine skin checks 7. Fluids/Electrolytes/Nutrition:    6/4 BUN improved to 22 today        -discussed pushing fluids, she is eating 100%  -recheck labs again Monday 8.  Cerebral vasospasm.  Continue Nimotop as directed, hold if bp too soft  -use TEDS or Abd binder if needed to support bp  -6/4 bp's better with improved fluid intake 9.  2.3 cm right thyroid heterogeneous nodule.  Follow-up outpatient 10.  Tobacco abuse.  Counseling 11.  Dysphagia.  Dysphagia #3 thin liquids. Follow-up speech therapy 12.  Urinary retention.      - increased urecholine to 25mg  TID on 5/31  6/2 increased urecholine to 50mg  tid   -bp can't support flomax  6/4 had one continent void this morning.    -continue I/O cath as needed   -oob to void, timed toileting 13. Stage II sacral decub-   continue foam dressing and barrier cream for now   14. Leukocytosis: 5/28: trending downward to 14k.   -recheck Monday  LOS: 8 days A FACE TO FACE EVALUATION WAS PERFORMED  Meredith Staggers 09/18/2019, 12:36 PM

## 2019-09-18 NOTE — Progress Notes (Signed)
Patient ID: Maree Erie, female   DOB: September 23, 1950, 69 y.o.   MRN: [id number]  SW followed up with pt son Virgel Gess [phone]) to discuss plan for d/c. Confirms he did speak with his mother about coming here to [facility] as it would be easier on him and he can provide care to her longer. Son is realistic about if she would be approved for travel by neurosurgeon. SW indicated will relay concerns to medical team as unable to answer. SW asked him to atleast begin searching for a neurosurgeon and PCP willing to accept a new patient, and we will schedule an appointment prior to d/c. SW and son will work on ensuring there will be no issues with pt insurance in area prior to discharge.   Loralee Pacas, MSW, [place] Office: [phone] Cell: [phone] Fax: [phone]

## 2019-09-19 DIAGNOSIS — R339 Retention of urine, unspecified: Secondary | ICD-10-CM

## 2019-09-19 DIAGNOSIS — I69391 Dysphagia following cerebral infarction: Secondary | ICD-10-CM

## 2019-09-19 DIAGNOSIS — G441 Vascular headache, not elsewhere classified: Secondary | ICD-10-CM

## 2019-09-19 DIAGNOSIS — D72829 Elevated white blood cell count, unspecified: Secondary | ICD-10-CM

## 2019-09-19 MED ORDER — NIMODIPINE 6 MG/ML PO SOLN
60.0000 mg | ORAL | Status: AC
  Filled 2019-09-19 (×17): qty 10

## 2019-09-19 MED ORDER — NIMODIPINE 30 MG PO CAPS
30.0000 mg | ORAL_CAPSULE | ORAL | Status: AC
  Administered 2019-09-19 – 2019-09-22 (×17): 30 mg via ORAL
  Filled 2019-09-19 (×17): qty 1

## 2019-09-19 NOTE — Progress Notes (Signed)
[place] PHYSICAL MEDICINE & REHABILITATION PROGRESS NOTE   Subjective/Complaints: Patient seen sitting up in bed this morning.  She states she slept well overnight.  She asks me to help her with her remote control she states she is awaiting discharge so she can "move on".  ROS: Limited due to cognition, but appears to deny CP, shortness of breath, nausea, vomiting, diarrhea.  Objective:   No results found. No results for input(s): WBC, HGB, HCT, PLT in the last 72 hours. Recent Labs    09/17/19 0552 09/18/19 0639  NA 140 140  K 4.2 4.1  CL 110 111  CO2 19* 18*  GLUCOSE 98 107*  BUN 31* 22  CREATININE 0.93 0.85  CALCIUM 9.7 9.4    Intake/Output Summary (Last 24 hours) at 09/19/2019 0936 Last data filed at 09/18/2019 1700 Gross per 24 hour  Intake --  Output 200 ml  Net -200 ml     Physical Exam: Vital Signs Blood pressure 98/72, pulse 73, temperature 98.5 F (36.9 C), temperature source Oral, resp. rate 16, height 5' (1.524 m), weight 68.2 kg, SpO2 96 %. Constitutional: No distress . Vital signs reviewed. HENT: Normocephalic.  Atraumatic. Eyes: EOMI. No discharge. Cardiovascular: No JVD. Respiratory: Normal effort.  No stridor. GI: Non-distended. Skin: Warm and dry.  Intact on visible skin. Psych: Distracted Musc: No edema in extremities.  No tenderness in extremities. Neurologic: Alert Motor: Bilateral upper extremities: 5/5 proximal distal Bilateral lower extremities: Hip flexion, knee extension 4/5, ankle dorsiflexion 4+/5    Assessment/Plan: 1. 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  which require 3+ hours per day of interdisciplinary therapy in a comprehensive inpatient rehab setting.  Physiatrist is providing close team supervision and 24 hour management of active medical problems listed below.  Physiatrist and rehab team continue to assess barriers to  discharge/monitor patient progress toward functional and medical goals  Care Tool:  Bathing    Body parts bathed by patient: Right arm, Left arm, Chest, Abdomen, Front perineal area, Buttocks, Right upper leg, Left upper leg, Face, Left lower leg, Right lower leg   Body parts bathed by helper: Right lower leg, Left lower leg     Bathing assist Assist Level: Minimal Assistance - Patient > 75%     Upper Body Dressing/Undressing Upper body dressing   What is the patient wearing?: Pull over shirt    Upper body assist Assist Level: Set up assist    Lower Body Dressing/Undressing Lower body dressing      What is the patient wearing?: Pants     Lower body assist Assist for lower body dressing: Minimal Assistance - Patient > 75%     Toileting Toileting Toileting Activity did not occur (Clothing management and hygiene only): (She will not keep diaper on)  Toileting assist Assist for toileting: Contact Guard/Touching assist     Transfers Chair/bed transfer  Transfers assist     Chair/bed transfer assist level: Contact Guard/Touching assist Chair/bed transfer assistive device: Other(no device)   Locomotion Ambulation   Ambulation assist      Assist level: Contact Guard/Touching assist Assistive device: No Device Max distance: 40'   Walk 10 feet activity   Assist     Assist level: Contact Guard/Touching assist Assistive device: Maxi Sky   Walk 50 feet activity   Assist Walk 50 feet with 2 turns activity did not occur: Safety/medical concerns  Assist level: Contact Guard/Touching assist Assistive device: [employer]  Walk 150 feet activity   Assist Walk 150 feet activity did not occur: Safety/medical concerns  Assist level: Minimal Assistance - Patient > 75% Assistive device: Walker-rolling    Walk 10 feet on uneven surface  activity   Assist Walk 10 feet on uneven surfaces activity did not occur: Safety/medical concerns          Wheelchair     Assist Will patient use wheelchair at discharge?: Yes Type of Wheelchair: Manual    Wheelchair assist level: Moderate Assistance - Patient 50 - 74% Max wheelchair distance: 50    Wheelchair 50 feet with 2 turns activity    Assist        Assist Level: Moderate Assistance - Patient 50 - 74%   Wheelchair 150 feet activity     Assist  Wheelchair 150 feet activity did not occur: Safety/medical concerns       Blood pressure 98/72, pulse 73, temperature 98.5 F (36.9 C), temperature source Oral, resp. rate 16, height 5' (1.524 m), weight 68.2 kg, SpO2 96 %.  Medical Problem List and Plan: 1.  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  Status post coil embolization 09/01/2019  Continue CIR 2.  Antithrombotics: -DVT/anticoagulation: Pulmonary embolus.  Status post IVC filter 09/02/2019.  Lower extremity Dopplers negative.  Continue low-dose heparin             -antiplatelet therapy: Aspirin 325 mg and Plavix 75 mg daily 3. Pain Management: Tylenol as needed.  Topiramate 25mg  daily seems to have helped headaches  Controlled on 6/5 4. Mood: Provide emotional support             -antipsychotic agents: N/A 5. Neuropsych: This patient is not fully capable of making decisions on her own behalf.  -Telesitter for safety 6. Skin/Wound Care: Routine skin checks 7. Fluids/Electrolytes/Nutrition:    Labs ordered for Monday 8.  Cerebral vasospasm.  Nimotop decreased on 6/5  -use TEDS or Abd binder if needed to support bp  BP soft on 6/5 9.  2.3 cm right thyroid heterogeneous nodule.  Follow-up outpatient 10.  Tobacco abuse.  Counseling 11.  Post stroke Dysphagia. Dysphagia #3 thins liquids. Follow-up speech therapy 12.  Urinary retention.     6/2 increased urecholine to 50mg  tid   -bp can't support flomax 13. Stage II sacral decub-   continue foam dressing and barrier cream for now    14. Leukocytosis:   WBCs 14.4 on 5/28, labs ordered for Monday  Afebrile   LOS: 9 days A FACE TO FACE EVALUATION WAS PERFORMED  [name] [name] 09/19/2019, 9:36 AM

## 2019-09-20 ENCOUNTER — Inpatient Hospital Stay (HOSPITAL_COMMUNITY): Payer: Medicare Other

## 2019-09-20 DIAGNOSIS — I67848 Other cerebrovascular vasospasm and vasoconstriction: Secondary | ICD-10-CM

## 2019-09-20 DIAGNOSIS — I952 Hypotension due to drugs: Secondary | ICD-10-CM

## 2019-09-20 MED ORDER — ONDANSETRON HCL 4 MG PO TABS
2.0000 mg | ORAL_TABLET | Freq: Once | ORAL | Status: AC
  Administered 2019-09-20: 2 mg via ORAL
  Filled 2019-09-20: qty 1

## 2019-09-20 NOTE — Progress Notes (Signed)
Physical Therapy Weekly Progress Note  Patient Details  Name: Haley Mayo MRN: [id number] Date of Birth: Jan 15, 1951  Beginning of progress report period: Sep 11, 2019 End of progress report period: September 20, 2019  Today's Date: 09/20/2019 PT Individual Time: 0905-1001 PT Individual Time Calculation (min): 56 min   Patient has met 3 of 3 short term goals.  Patient has made slow, but steady progress this week limited by [facility] with moblility, now managed by B thigh high TED hose, B ACE wraps to LEs, and an abdominal binder. She currently requires min A-CGA for mobility overall, gait up to 180 ft with RW, often shorter distances due to decreased activity tolerance, decreased attention, on OH. Patient has poor attention throughout sessions that caused decreased safety awareness and maintains tangential conversation throughout sessions due to cognitive deficits.   Patient continues to demonstrate the following deficits muscle weakness, decreased cardiorespiratoy endurance, decreased attention, decreased awareness, decreased problem solving, decreased safety awareness and decreased memory and decreased sitting balance, decreased standing balance, decreased postural control and decreased balance strategies and therefore will continue to benefit from skilled PT intervention to increase functional independence with mobility.  Patient progressing toward long term goals..  Continue plan of care.  PT Short Term Goals Week 1:  PT Short Term Goal 1 (Week 1): Pt will transfer sup to sit w/ min A . PT Short Term Goal 1 - Progress (Week 1): Met PT Short Term Goal 2 (Week 1): Pt will transfer sit to stand and SPT  w/ min A PT Short Term Goal 2 - Progress (Week 1): Met PT Short Term Goal 3 (Week 1): Pt will amb w/ RW and min A up to 50'. PT Short Term Goal 3 - Progress (Week 1): Met Week 2:  PT Short Term Goal 1 (Week 2): STG=LTG due to ELOS.  Skilled Therapeutic Interventions/Progress Updates:      Patient in w/c in the room with NT upon PT arrival. Patient alert and agreeable to PT session. Patient denied pain during session. Patient disoriented and perseverating on someone stealing her things. PT found her bag of personal items and reoriented patient to location, situation, and time. Patient aware it was the weekend. Patient also asking when she will be d/c, discussed d/c planning and plan to d/c home with her son. Patient expressed concern about her son coming out her due to the expense. Discussed patient's deficits and increased fall risk at home and need for 24/7 supervision for safety. Patient stated understanding and asked if that will only be for 2 weeks. Educated patient about importance of allowing herself time for recovery and follow-up therapy and with the MD to determine when and if it may be safe for her to start living alone again. Discussed moving back to [place] with her son if cleared to fly, patient open to this idea, would like to talk to her son and CSW about this option prior to d/c. Will pass on CSW.   Therapeutic Activity: Donned B thigh high TED hose and B ACE wraps to LEs with total A for time management and energy conservation to manage OH with mobility. Educated patient on symptoms and causes of OH and reason for TEDs and ACE wraps to prevent symptoms. Transfers: Patient performed sit to/from stand x5 with CGA without AD. Provided verbal cues for forward weight shift and erect posture in standing to reduce risk of forward LOB.  Gait Training:  Patient ambulated 153 feet and 42 feet without an  AD with CGA and intermittent min A for forward LOB due to forward trunk lean. She also ambulated with a SPC with CGA and min A x1 due to forward LOB. Ambulated with shuffling gait pattern with forward trunk flexion, improved with tactile and verbal cues for erect posture, looking ahead, and increased step height.  Patient ascended/descended 8 steps using B rails with CGA for  safety/balance. Performed reciprocal gait pattern while ascending and step-to gait pattern while descending. Provided cues for technique and sequencing.   Wheelchair Mobility:  Patient propelled wheelchair 100 feet with CGA-min A x3 to avoid objects using B UEs, limited distance due to decreased activity tolerance and UE strength. Provided verbal cues for turning technique and avoiding objects on the R due to inattention.  Patient in w/c in the room at end of session with breaks locked, seat belt alarm set, and all needs within reach.    Therapy Documentation Precautions:  Precautions Precautions: Fall Precaution Comments: impulsive, L lateral lean Restrictions Weight Bearing Restrictions: No   Therapy/Group: Individual Therapy  Bethanny Toelle L Moroni Nester PT, DPT  09/20/2019, 12:58 PM

## 2019-09-20 NOTE — Progress Notes (Signed)
[place] PHYSICAL MEDICINE & REHABILITATION PROGRESS NOTE   Subjective/Complaints: Patient seen sitting up in bed this morning.  She states she slept well overnight.  No reported issues.  She states her back is sore.  She is confused and tangential.  She asks when she is going to get all of the medications that she is on.  ROS: Limited due to cognition  Objective:   No results found. No results for input(s): WBC, HGB, HCT, PLT in the last 72 hours. Recent Labs    09/18/19 0639  NA 140  K 4.1  CL 111  CO2 18*  GLUCOSE 107*  BUN 22  CREATININE 0.85  CALCIUM 9.4    Intake/Output Summary (Last 24 hours) at 09/20/2019 1229 Last data filed at 09/20/2019 0900 Gross per 24 hour  Intake 750 ml  Output --  Net 750 ml     Physical Exam: Vital Signs Blood pressure 126/67, pulse 73, temperature 98.3 F (36.8 C), resp. rate 18, height 5' (1.524 m), weight 67.7 kg, SpO2 99 %.  Constitutional: No distress . Vital signs reviewed. HENT: Normocephalic.  Atraumatic. Eyes: EOMI. No discharge. Cardiovascular: No JVD. Respiratory: Normal effort.  No stridor. GI: Non-distended. Skin: Warm and dry.  Intact on visible skin. Psych: Distracted.  Confused.  Tangential. Musc: No edema in extremities.  No tenderness in extremities. Neurologic: Alert Motor: Bilateral upper extremities: 5/5 proximal distal Bilateral lower extremities: Hip flexion, knee extension 4/5, ankle dorsiflexion 4+/5, unchanged  Assessment/Plan: 1. 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  which require 3+ hours per day of interdisciplinary therapy in a comprehensive inpatient rehab setting.  Physiatrist is providing close team supervision and 24 hour management of active medical problems listed below.  Physiatrist and rehab team continue to assess barriers to discharge/monitor patient progress toward functional and medical  goals  Care Tool:  Bathing    Body parts bathed by patient: Right arm, Left arm, Chest, Abdomen, Front perineal area, Buttocks, Right upper leg, Left upper leg, Face, Left lower leg, Right lower leg   Body parts bathed by helper: Right lower leg, Left lower leg     Bathing assist Assist Level: Minimal Assistance - Patient > 75%     Upper Body Dressing/Undressing Upper body dressing   What is the patient wearing?: Pull over shirt    Upper body assist Assist Level: Set up assist    Lower Body Dressing/Undressing Lower body dressing      What is the patient wearing?: Pants     Lower body assist Assist for lower body dressing: Minimal Assistance - Patient > 75%     Toileting Toileting Toileting Activity did not occur (Clothing management and hygiene only): N/A (no void or bm)  Toileting assist Assist for toileting: Independent with assistive device Assistive Device Comment: walker   Transfers Chair/bed transfer  Transfers assist     Chair/bed transfer assist level: Independent with assistive device Chair/bed transfer assistive device: Programmer, multimedia   Ambulation assist      Assist level: Contact Guard/Touching assist Assistive device: No Device Max distance: 40'   Walk 10 feet activity   Assist     Assist level: Contact Guard/Touching assist Assistive device: Maxi Sky   Walk 50 feet activity   Assist Walk 50 feet with 2 turns activity did not occur: Safety/medical concerns  Assist level: Contact Guard/Touching assist Assistive device: Maxi Sky    Walk 150 feet  activity   Assist Walk 150 feet activity did not occur: Safety/medical concerns  Assist level: Minimal Assistance - Patient > 75% Assistive device: Walker-rolling    Walk 10 feet on uneven surface  activity   Assist Walk 10 feet on uneven surfaces activity did not occur: Safety/medical concerns         Wheelchair     Assist Will patient use wheelchair  at discharge?: Yes Type of Wheelchair: Manual    Wheelchair assist level: Moderate Assistance - Patient 50 - 74% Max wheelchair distance: 50    Wheelchair 50 feet with 2 turns activity    Assist        Assist Level: Moderate Assistance - Patient 50 - 74%   Wheelchair 150 feet activity     Assist  Wheelchair 150 feet activity did not occur: Safety/medical concerns       Blood pressure 126/67, pulse 73, temperature 98.3 F (36.8 C), resp. rate 18, height 5' (1.524 m), weight 67.7 kg, SpO2 99 %.  Medical Problem List and Plan: 1.  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  Status post coil embolization 09/01/2019  Continue CIR 2.  Antithrombotics: -DVT/anticoagulation: Pulmonary embolus.  Status post IVC filter 09/02/2019.  Lower extremity Dopplers negative.  Continue low-dose heparin             -antiplatelet therapy: Aspirin 325 mg and Plavix 75 mg daily 3. Pain Management: Tylenol as needed.  Topiramate 25mg  daily seems to have helped headaches  Patient with controlled on 6/6 4. Mood: Provide emotional support             -antipsychotic agents: N/A 5. Neuropsych: This patient is not fully capable of making decisions on her own behalf.  -Telesitter for safety 6. Skin/Wound Care: Routine skin checks 7. Fluids/Electrolytes/Nutrition:    Labs ordered for tomorrow 8.  Cerebral vasospasm.  Nimotop decreased on 6/5  -use TEDS or Abd binder if needed to support bp  BP improved on 6/6 9.  2.3 cm right thyroid heterogeneous nodule.  Follow-up outpatient 10.  Tobacco abuse.  Counseling 11.  Post stroke Dysphagia.  Dysphagia #3 thins liquids. Follow-up speech therapy 12.  Urinary retention.     6/2 increased urecholine to 50mg  tid   -bp can't support flomax 13. Stage II sacral decub-   continue foam dressing and barrier cream for now   14. Leukocytosis:   WBCs 14.4 on 5/28, labs ordered for  tomorrow  Afebrile   LOS: 10 days A FACE TO FACE EVALUATION WAS PERFORMED  Yareth Macdonnell [name] 09/20/2019, 12:29 PM

## 2019-09-20 NOTE — Progress Notes (Signed)
+/-   sleep. Continent B & B. BM this AM. Denied pain. 2 attempts OOB without assistance. Telesitter in place, along with bed alarm. Haley Mayo A

## 2019-09-21 ENCOUNTER — Inpatient Hospital Stay (HOSPITAL_COMMUNITY): Payer: Medicare Other

## 2019-09-21 ENCOUNTER — Inpatient Hospital Stay (HOSPITAL_COMMUNITY): Payer: Medicare Other | Admitting: Physical Therapy

## 2019-09-21 ENCOUNTER — Inpatient Hospital Stay (HOSPITAL_COMMUNITY): Payer: Medicare Other | Admitting: Occupational Therapy

## 2019-09-21 LAB — BASIC METABOLIC PANEL
Anion gap: 9 (ref 5–15)
BUN: 21 mg/dL (ref 8–23)
CO2: 20 mmol/L — ABNORMAL LOW (ref 22–32)
Calcium: 9.2 mg/dL (ref 8.9–10.3)
Chloride: 111 mmol/L (ref 98–111)
Creatinine, Ser: 0.94 mg/dL (ref 0.44–1.00)
GFR calc Af Amer: >60 mL/min (ref >60)
GFR calc non Af Amer: >60 mL/min (ref >60)
Glucose, Bld: 102 mg/dL — ABNORMAL HIGH (ref 70–99)
Potassium: 3.8 mmol/L (ref 3.5–5.1)
Sodium: 140 mmol/L (ref 135–145)

## 2019-09-21 LAB — CBC
HCT: 32.7 % — ABNORMAL LOW (ref 36.0–46.0)
Hemoglobin: 10.6 g/dL — ABNORMAL LOW (ref 12.0–15.0)
MCH: 28.8 pg (ref 26.0–34.0)
MCHC: 32.4 g/dL (ref 30.0–36.0)
MCV: 88.9 fL (ref 80.0–100.0)
Platelets: 271 10*3/uL (ref 150–400)
RBC: 3.68 MIL/uL — ABNORMAL LOW (ref 3.87–5.11)
RDW: 17.6 % — ABNORMAL HIGH (ref 11.5–15.5)
WBC: 7.7 10*3/uL (ref 4.0–10.5)
nRBC: 0 % (ref 0.0–0.2)

## 2019-09-21 NOTE — Progress Notes (Signed)
+/-   sleep. A & O x 2. 2 attempts OOB without assist. Large amount of abdominal bruising from heparin injections. Continent BM. Patrici Ranks A

## 2019-09-21 NOTE — Progress Notes (Signed)
[place] PHYSICAL MEDICINE & REHABILITATION PROGRESS NOTE   Subjective/Complaints: Up in bed. Wants to get up and walk. "i'm ready to go home."  ROS: Limited due to cognitive/behavioral    Objective:   No results found. Recent Labs    09/21/19 0948  WBC 7.7  HGB 10.6*  HCT 32.7*  PLT 271   No results for input(s): NA, K, CL, CO2, GLUCOSE, BUN, CREATININE, CALCIUM in the last 72 hours.  Intake/Output Summary (Last 24 hours) at 09/21/2019 1059 Last data filed at 09/21/2019 0700 Gross per 24 hour  Intake 840 ml  Output --  Net 840 ml     Physical Exam: Vital Signs Blood pressure 117/68, pulse 71, temperature 98.1 F (36.7 C), temperature source Oral, resp. rate 18, height 5' (1.524 m), weight 67.6 kg, SpO2 99 %.  Constitutional: No distress . Vital signs reviewed. HEENT: EOMI, oral membranes moist Neck: supple Cardiovascular: RRR without murmur. No JVD    Respiratory/Chest: CTA Bilaterally without wheezes or rales. Normal effort    GI/Abdomen: BS +, non-tender, non-distended Ext: no clubbing, cyanosis, or edema Psych: pleasant but confused.  Skin: intact Musc: No edema in extremities.  No tenderness in extremities. Neurologic: Alert, oriented to self, place with cues. Confabulates still, tangential Motor: Bilateral upper extremities: 5/5 proximal distal Bilateral lower extremities: Hip flexion, knee extension 4/5, ankle dorsiflexion 4+/5, unchanged  Assessment/Plan: 1. 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  which require 3+ hours per day of interdisciplinary therapy in a comprehensive inpatient rehab setting.  Physiatrist is providing close team supervision and 24 hour management of active medical problems listed below.  Physiatrist and rehab team continue to assess barriers to discharge/monitor patient progress toward functional and medical goals  Care  Tool:  Bathing    Body parts bathed by patient: Right arm, Left arm, Chest, Abdomen, Front perineal area, Buttocks, Right upper leg, Left upper leg, Face, Left lower leg, Right lower leg   Body parts bathed by helper: Right lower leg, Left lower leg     Bathing assist Assist Level: Minimal Assistance - Patient > 75%     Upper Body Dressing/Undressing Upper body dressing   What is the patient wearing?: Pull over shirt    Upper body assist Assist Level: Set up assist    Lower Body Dressing/Undressing Lower body dressing      What is the patient wearing?: Pants     Lower body assist Assist for lower body dressing: Minimal Assistance - Patient > 75%     Toileting Toileting Toileting Activity did not occur (Clothing management and hygiene only): N/A (no void or bm)  Toileting assist Assist for toileting: Independent with assistive device Assistive Device Comment: walker   Transfers Chair/bed transfer  Transfers assist     Chair/bed transfer assist level: Contact Guard/Touching assist Chair/bed transfer assistive device: Programmer, multimedia   Ambulation assist      Assist level: Contact Guard/Touching assist Assistive device: No Device Max distance: 153'   Walk 10 feet activity   Assist     Assist level: Contact Guard/Touching assist Assistive device: No Device   Walk 50 feet activity   Assist Walk 50 feet with 2 turns activity did not occur: Safety/medical concerns  Assist level: Contact Guard/Touching assist Assistive device: No Device    Walk 150 feet activity   Assist Walk 150 feet activity did not occur: Safety/medical concerns  Assist level: Contact Guard/Touching  assist Assistive device: No Device    Walk 10 feet on uneven surface  activity   Assist Walk 10 feet on uneven surfaces activity did not occur: Safety/medical concerns         Wheelchair     Assist Will patient use wheelchair at discharge?: Yes Type  of Wheelchair: Manual    Wheelchair assist level: Contact Guard/Touching assist Max wheelchair distance: 100'    Wheelchair 50 feet with 2 turns activity    Assist        Assist Level: Contact Guard/Touching assist   Wheelchair 150 feet activity     Assist  Wheelchair 150 feet activity did not occur: Safety/medical concerns       Blood pressure 117/68, pulse 71, temperature 98.1 F (36.7 C), temperature source Oral, resp. rate 18, height 5' (1.524 m), weight 67.6 kg, SpO2 99 %.  Medical Problem List and Plan: 1.  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  Status post coil embolization 09/01/2019  Continue CIR PT, OT, SLP  -spoke with son Friday. He wants to bring her back to [place]. Have reached out to [facility] as to how long they would project it to be before she could do so. 2.  Antithrombotics: -DVT/anticoagulation: Pulmonary embolus.  Status post IVC filter 09/02/2019.  Lower extremity Dopplers negative.  Continue low-dose heparin             -antiplatelet therapy: Aspirin 325 mg and Plavix 75 mg daily 3. Pain Management: Tylenol as needed.  Topiramate 25mg  daily seems to have helped headaches  Patient with controlled on 6/7 4. Mood: Provide emotional support             -antipsychotic agents: N/A 5. Neuropsych: This patient is not fully capable of making decisions on her own behalf.  -Telesitter for safety 6. Skin/Wound Care: Routine skin checks 7. Fluids/Electrolytes/Nutrition:    Labs pending 8.  Cerebral vasospasm.  Nimotop decreased on 6/5  -use TEDS or Abd binder if needed to support bp  BP improved on 6/7 9.  2.3 cm right thyroid heterogeneous nodule.  Follow-up outpatient 10.  Tobacco abuse.  Counseling 11.  Post stroke Dysphagia.  Dysphagia #3 thins liquids. Follow-up speech therapy 12.  Urinary retention.     6/2 increased urecholine to 50mg  tid   -bp can't support flomax 13.  Stage II sacral decub-   continue foam dressing and barrier cream for now   14. Leukocytosis:   resolved  LOS: 11 days A FACE TO [place] 09/21/2019, 10:59 AM

## 2019-09-21 NOTE — Progress Notes (Signed)
Physical Therapy Session Note  Patient Details  Name: Haley Mayo MRN: [id number] Date of Birth: Aug 01, 1950  Today's Date: 09/21/2019 PT Individual Time: 0930-1027 PT Individual Time Calculation (min): 57 min   Short Term Goals: Week 2:  PT Short Term Goal 1 (Week 2): STG=LTG due to ELOS.  Skilled Therapeutic Interventions/Progress Updates:     Patient in bed upon PT arrival. Patient alert and agreeable to PT session. Patient denied pain during session. Reported that she missed her OT session this morning and very distressed about this. PT looked up patient's schedule, informed patient that her OT had called out and her schedule had changed. Wrote down new schedule for today on patient's paper and reviewed schedule with patient. Patient very appreciative. Patient declined wearing TED hose and ACE wraps today. Provided education on purpose and benefits of wearing these during therapy. Patient continued to decline despite education and encouragement.   Focused session on gait and endurance training while monitoring for symptoms of OH and BP with mobility without LE compression interventions.   Therapeutic Activity: Bed Mobility: Patient performed supine to/from sit with supervision with HOB elevated to 30 degrees per MD orders and use of bed rail. Provided verbal cues for decreased use of bed rail to simulate home set-up. Transfers: Patient performed sit to/from stand x5 with supervision with use of RW. Provided verbal cues for hand placement, required cues each trial for pushing up and reaching back with 1 hand for safety compared to use of B UEs on RW.  Orthostatic Vitals:  Supine: BP 109/71, HR 69 Sitting: BP 99/59, HR 77 Standing: BP 99/59, HR 77 Standing x3 min: BP 102/58, HR 82  Gait Training:  Patient ambulated >250 feet x2 and 50 feet x1 using RW with close supervision and w/c follow for safety in case of OH symptoms with mobility. Ambulated with decreased gait speed,  decreased step length and height, decreased control of RW intermittently due to walking behind the RW, forward trunk lean, and downward head gaze. Provided verbal cues for staying close to the RW "hips between hands," erect posture, looking ahead, and increased step height. Switched patient to a shorter RW after first gait trial and patient was able to maintain closer position to RW more frequently, however still required mod cues, increased with fatigue. Patient able to find her way back from the ortho gym to the [facility], unable to recall which direction to turn for her room. Also, unable to recall her room number. Provided patient with her room number and patient was able to use the sign at the hall to determine which way to turn and located her room by looking for the correct room number with min cues.   Vitals following Gait trials: Trial 1: BP 106/50, HR 67 Trial 2: BP 103/78, HR 71  Therapeutic Exercise: Patient performed the following exercises with verbal and tactile cues for proper technique. -seated marching x2 min following drop in diastolic BP to elevate BP during active rest break  Patient in bed at end of session with breaks locked, bed alarm set, 4 rails up per safety sheet, and all needs within reach. Patient was asymptomatic throughout session with all mobility.    Therapy Documentation Precautions:  Precautions Precautions: Fall Precaution Comments: impulsive, L lateral lean Restrictions Weight Bearing Restrictions: No    Therapy/Group: Individual Therapy  Haley Mayo PT, DPT  09/21/2019, 12:26 PM

## 2019-09-21 NOTE — Progress Notes (Signed)
Speech Language Pathology Daily Session Note  Patient Details  Name: ROMONA MURDY MRN: [id number] Date of Birth: [birth year]  Today's Date: 09/21/2019 SLP Individual Time: 1102-1159 SLP Individual Time Calculation (min): 57 min  Short Term Goals: Week 2: SLP Short Term Goal 1 (Week 2): STGs=LTGs due to ELOS  Skilled Therapeutic Interventions: Skilled ST services focused on cognitive skills. Pt expressed confusion for reason for hospital admission, SLP provided education of diagnosis and current cognitive changes, utilizing visual aid in notebook. SLP facilitated basic problem solving and sustained attention in money management task and medication management tasks (ALFA) pt required supervision A verbal cues. SLP also facilitated semi-complex problem solving skills utilizing ALFA word problems, pt demonstrated ability to answer 6 out 10 questions independently and 9 out 10 questions with moderate verbal cues. Pt consumed regular textured snack with no overt s/s aspiration, appropriate oral clearance and swallow appeared timely. SLP recommends trial tray of regular textures in upcoming treatment sessions. Pt was left in room with call bell within reach and bed/chair alarm set. ST recommends to continue skilled ST services.      Pain Pain Assessment Pain Scale: 0-10 Pain Score: 0-No pain  Therapy/Group: Individual Therapy  Niquita Digioia  [facility] 09/21/2019, 8:04 AM

## 2019-09-21 NOTE — Progress Notes (Signed)
Physical Therapy Session Note  Patient Details  Name: Haley Mayo MRN: [id number] Date of Birth: 08-Sep-1950  Today's Date: 09/21/2019 PT Individual Time: 1406-1501 PT Individual Time Calculation (min): 55 min   Short Term Goals: Week 2:  PT Short Term Goal 1 (Week 2): STG=LTG due to ELOS.  Skilled Therapeutic Interventions/Progress Updates:    Patient received in bed, pleasant and willing to participate with therapies; applied thigh high TEDS and ACE wraps totalA in bed while educating patient about purpose of this equipment to avoid OH and related hypotensive events/passing out. Able to complete all functional mobility with S and RW today, continues to need verbal cues due to overall confusion and for safety/hand placement with mobility however. Monitored vitals during session and they remained WNL at 100-115/60-70s with TEDS/ACE wraps. Tolerated riding Nustep for 8 minutes with BLEs with max cues for attention due to her being easily distracted by other activities in the gym; this was followed immediately by gait training approximately 130f with RW/S to min guard. Upon return to her room she asked to take TEDs/ACEs off and was left in supine with this equipment doffed and all needs otherwise met, bed alarm active.   Therapy Documentation Precautions:  Precautions Precautions: Fall Precaution Comments: impulsive, L lateral lean Restrictions Weight Bearing Restrictions: No Pain: Pain Assessment Pain Scale: Faces Faces Pain Scale: No hurt    Therapy/Group: Individual Therapy   KWindell Norfolk DPT, PN1   Supplemental Physical Therapist [place]   Pager [phone] Rehab Office [phone]   09/21/2019, 3:30 PM

## 2019-09-22 ENCOUNTER — Inpatient Hospital Stay (HOSPITAL_COMMUNITY): Payer: Medicare Other | Admitting: Occupational Therapy

## 2019-09-22 ENCOUNTER — Inpatient Hospital Stay (HOSPITAL_COMMUNITY): Payer: Medicare Other | Admitting: Physical Therapy

## 2019-09-22 ENCOUNTER — Inpatient Hospital Stay (HOSPITAL_COMMUNITY): Payer: Medicare Other | Admitting: Speech Pathology

## 2019-09-22 LAB — GLUCOSE, CAPILLARY: Glucose-Capillary: 102 mg/dL — ABNORMAL HIGH (ref 70–99)

## 2019-09-22 MED ORDER — LOPERAMIDE HCL 2 MG PO CAPS: 2.0000 mg | ORAL_CAPSULE | ORAL | Status: DC | PRN

## 2019-09-22 NOTE — Progress Notes (Signed)
Physical Therapy Session Note  Patient Details  Name: Haley Mayo MRN: [id number] Date of Birth: 04-08-1951  Today's Date: 09/22/2019 PT Individual Time: 1620-1730 PT Individual Time Calculation (min): 70 min   Short Term Goals: Week 2:  PT Short Term Goal 1 (Week 2): STG=LTG due to ELOS.  Skilled Therapeutic Interventions/Progress Updates:   Pt received supine in bed and agreeable to therapy session. Pt already wearing B LE thigh high TED hose and with minimal encouragement and education on purpose pt agreeable to continue wearing them. Supine>sitting R EOB with supervision. Vitals sitting: BP 120/62 (MAP 79), HR 81bpm. Sit>stand EOB>RW with supervision. Reassessed vitals in standing: BP 109/61 (MAP 76), HR 90bpm. Pt denies any symptoms. Gait training ~260ft to main therapy gym using RW with CGA for steadying - repeated cuing for closer AD management and improved upright trunk posture. Reassessed vitals in sitting after ambulation: BP 98/64 (MAP 72), HR 85bpm Gait training ~33ft x2 without AD with min assist for balance continues to demonstrate anterior trunk flexion with anterior lean and a few minor anterior LOBs - wider BOS and unsteadiness. Reassessed vitals after 1st ambulation: BP 121/59 (MAP 78), HR 92bpm. R/L alternate B LE foot taps on 8" step with R HHA and min assist for balance - pt spontaneously returns to sitting reporting the step is too high but with education on purpose of activity pt agreeable to continue x20 reps. B LE strengthening with dynamic standing balance via repeated stepping on/off 4" step with R HHA and min assist x10 reps each LE. Standing balance in narrow BOS during dual-cognitive task for sustained attention and problem solving of creating colored ping pong ball patterns. Reassessed vitals after prolonged standing: BP 95/61 (MAP 69), HR 88bpm Gait training ~52ft, no AD, with min assist and pt continuing to have increased anterior trunk lean/LOB and increased  postural sway with wider BOS - cuing for correction. Sitting>supine on mat with min assist for LE management onto mat. Attempted supine single leg knee to chest and 4-leg cross glute and piriformis stretch for glute soreness relief but pt denies therefore discontinued. Performed supine bridging x15reps with cuing for increased glute activation and hip extension - donned level 2 theraband for increased hip abductor activation x15 reps continued cuing for increased hip extension. Pt states "I need to do this...this is a good exercise." Supine>sitting L EOM with min assist for trunk upright. Stand pivot to w/c, no AD with CGA for steadying. Transported back to room and left seated in w/c with needs in reach, seat belt alarm on, and telesitter in place.   Therapy Documentation Precautions:  Precautions Precautions: Fall Precaution Comments: impulsive, L lateral lean Restrictions Weight Bearing Restrictions: No  Pain: Pt reports "no pain" but then noted to be hesitant to sit down on mat and upon questioning pt reports pain from frequent BM but with further description reports either glute muscle soreness or pressure soreness from prolonged sitting.    Therapy/Group: Individual Therapy  Tawana Scale, PT, DPT 09/22/2019, 3:56 PM

## 2019-09-22 NOTE — Progress Notes (Addendum)
Speech Language Pathology Daily Session Note  Patient Details  Name: AKYRA BOUCHIE MRN: [id number] Date of Birth: 26-Sep-1950  Today's Date: 09/22/2019 SLP Individual Time: 1105-1205 SLP Individual Time Calculation (min): 60 min  Short Term Goals: Week 2: SLP Short Term Goal 1 (Week 2): STGs=LTGs due to ELOS  Skilled Therapeutic Interventions: Skilled treatment session focused on dysphagia and cognitive goals. Upon arrival, patient was on the commode. Patient reports loose stools with stomach cramps, RN made aware. Patient ambulated back to the bed but instantly needed to ambulate back to the commode and was incontinent of bowel. Mod verbal cues were needed for safety while donning brief and changing pants. Patient perseverative on not feeling well but agreeable to participate in treatment session. Patient consumed trials of regular textures with thin liquids without overt s/s of aspiration and efficient mastication, therefore, recommend patient upgrade to regular textures. Patient also independently utilized her schedule to recall previous therapy sessions and recalled events from the sessions with supervision verbal cues. Patient left upright in bed with alarm on and all needs within reach. Continue with current plan of care.      Pain No/Denies Pain   Therapy/Group: Individual Therapy  Jkai Arwood 09/22/2019, 3:35 PM

## 2019-09-22 NOTE — Patient Care Conference (Signed)
Inpatient RehabilitationTeam Conference and Plan of Care Update Date: 09/22/2019   Time: 2:54 PM    Patient Name: Haley Mayo      Medical Record Number: [id number]  Date of Birth: 05-08-1950 Sex: Female         Room/Bed: 4W20C/4W20C-01 Payor Info: Payor: MEDICARE / Plan: MEDICARE PART A AND B / Product Type: *No Product type* /    Admit Date/Time:  09/10/2019  4:18 PM  Primary Diagnosis:  SAH (subarachnoid hemorrhage) ([place])  Patient Active Problem List   Diagnosis Date Noted  . Cerebral vasospasm   . Hypotension due to drugs   . Leukocytosis   . Urinary retention   . Dysphagia, post-stroke   . Vascular headache   . SAH (subarachnoid hemorrhage) ([place]) 09/10/2019  . Pressure injury of skin 09/05/2019  . Subarachnoid hemorrhage ([place]) 08/30/2019  . CAP (community acquired pneumonia) 08/27/2019  . Community acquired pneumonia 08/26/2019  . Elevated CK 08/26/2019  . Generalized weakness 08/26/2019  . Healthcare maintenance 02/02/2011  . Smoking 02/02/2011  . 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 Hyperlipemia 01/27/2010  . LEUKOCYTOSIS 01/27/2010  . Essential hypertension, benign 10/04/2009    Expected Discharge Date: Expected Discharge Date: 09/29/19  Team Members Present: Physician leading conference: Dr. Alger Simons Care Coodinator Present: Loralee Pacas, LCSWA;Other (comment)(Talene Glastetter Creig Hines, RN, BSN, CRRN) Nurse Present: Rayne Du, LPN PT Present: Apolinar Junes, PT OT Present: Cherylynn Ridges, OT SLP Present: Weston Anna, SLP PPS Coordinator present : Gunnar Fusi, SLP     Current Status/Progress Goal Weekly Team Focus  Bowel/Bladder   Continent of B &B, No I & O caths needed lately. Urecholine 25mg  tid.  Maintain continence  assess every shift and PRN. Maybe decrease or dc urecholine.   Swallow/Nutrition/ Hydration   Dys. 3 textures with thin liquis, Mod I  Mod I  trials of regular textures   ADL's   Supervision/CGA overall  Supervision   cognitive retraining, dc planning, self-care retraining, activity tolerance, pt/dmaily education   Mobility   Supervision-CGA overall, gait >250 ft, CGA 8 steps B rails  Supervision overall  Balance, strengthening, activity tolerance, gait and stair training, functional mobility, attention, recall of new information, orientation, patient/caregiver education   Communication             Safety/Cognition/ Behavioral Observations  Mod-Max A  Min A  basic problem solving, recall with use of strategies, attention   Pain   Rarely complains of headache  < 2      Skin   coccyx red, no breakdown, not using  foam. Large amount of bruising to abd, from heparin injections.  Maintain skin integrity, no skin breakdown       Rehab Goals Patient on target to meet rehab goals: Yes *See Care Plan and progress notes for long and short-term goals.     Barriers to Discharge  Current Status/Progress Possible Resolutions Date Resolved   Nursing                  PT  Lack of/limited family support;Behavior  Patient living alone PTA, her son to assist her at d/c when he arrives from [place], need family education prior to d/c, patient with new cognitive deficits affecting safety with mobility              OT                  SLP Decreased caregiver support;Lack of/limited family support intermittent confusion and confabulations at times  Care Coordinator Decreased caregiver support;Lack of/limited family support;Medical stability;Other (comments) Caregiver lives in [place], [place].            Discharge Planning/Teaching Needs:  Most recent discussion with pt and pt son include pt travel back to [place], [place] so she can have support from son and access to cars. Pt does not drive here and uses [employer]. Can pt travel to [facility]?  Family education will be limited due to son's current location.   Team Discussion:  Pt is medically ready for discharge. PVR's have been low lately and pt has been continent. MD may  decrease Urecholine. Pt progressing toward supervision goals. ST still working with pt on cognition goals. Pt to travel with son back to [place] at discharge.  Revisions to Treatment Plan:  none    Medical Summary Current Status: gradual improvement in cognition, still LOC and tangential. emptying bladder now, occ incontinence. Weekly Focus/Goal: cognition, sleep, bladder emptying/continence  Barriers to Discharge: Behavior;Medical stability   Possible Resolutions to Barriers: ongoing cognition, sleep optimization, behavioral mgt   Continued Need for Acute Rehabilitation Level of Care: The patient requires daily medical management by a physician with specialized training in physical medicine and rehabilitation for the following reasons: Direction of a multidisciplinary physical rehabilitation program to maximize functional independence : Yes Medical management of patient stability for increased activity during participation in an intensive rehabilitation regime.: Yes Analysis of laboratory values and/or radiology reports with any subsequent need for medication adjustment and/or medical intervention. : Yes   I attest that I was present, lead the team conference, and concur with the assessment and plan of the team.   Cristi Loron 09/22/2019, 2:54 PM

## 2019-09-22 NOTE — Progress Notes (Signed)
Slept good last night. Refused heparin injection this AM. "I don't need that." Continent of B & B. Telesitter  Still in place for patient's safety. Haley Mayo A

## 2019-09-22 NOTE — Progress Notes (Addendum)
[facility] PHYSICAL MEDICINE & REHABILITATION PROGRESS NOTE   Subjective/Complaints: No new issues. Denies pain. Slept fairly well last night. Doesn't want heparin shots. Asked when she can go home  ROS: somewhat limited d/t behavior.   Objective:   No results found. Recent Labs    09/21/19 0948  WBC 7.7  HGB 10.6*  HCT 32.7*  PLT 271   Recent Labs    09/21/19 0948  NA 140  K 3.8  CL 111  CO2 20*  GLUCOSE 102*  BUN 21  CREATININE 0.94  CALCIUM 9.2    Intake/Output Summary (Last 24 hours) at 09/22/2019 1044 Last data filed at 09/21/2019 2300 Gross per 24 hour  Intake 240 ml  Output --  Net 240 ml     Physical Exam: Vital Signs Blood pressure 119/66, pulse 66, temperature 98.2 F (36.8 C), temperature source Oral, resp. rate 16, height 5' (1.524 m), weight 67.6 kg, SpO2 99 %.  Constitutional: No distress . Vital signs reviewed. HEENT: EOMI, oral membranes moist Neck: supple Cardiovascular: RRR without murmur. No JVD    Respiratory/Chest: CTA Bilaterally without wheezes or rales. Normal effort    GI/Abdomen: BS +, non-tender, non-distended Ext: no clubbing, cyanosis, or edema Psych: pleasant and cooperative Skin: intact Musc: No edema in extremities.  No tenderness in extremities. Neurologic: Alert, oriented to self, place with cues/redirection but still confabulates and is tangential at times.  Motor: Bilateral upper extremities: 5/5 proximal distal Bilateral lower extremities: Hip flexion, knee extension 4/5, ankle dorsiflexion 4+/5, unchanged today.   Assessment/Plan: 1. 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  which require 3+ hours per day of interdisciplinary therapy in a comprehensive inpatient rehab setting.  Physiatrist is providing close team supervision and 24 hour management of active medical problems listed below.  Physiatrist and rehab team  continue to assess barriers to discharge/monitor patient progress toward functional and medical goals  Care Tool:  Bathing    Body parts bathed by patient: Right arm, Left arm, Chest, Abdomen, Front perineal area, Buttocks, Right upper leg, Left upper leg, Face, Left lower leg, Right lower leg   Body parts bathed by helper: Right lower leg, Left lower leg     Bathing assist Assist Level: Minimal Assistance - Patient > 75%     Upper Body Dressing/Undressing Upper body dressing   What is the patient wearing?: Pull over shirt    Upper body assist Assist Level: Set up assist    Lower Body Dressing/Undressing Lower body dressing      What is the patient wearing?: Pants     Lower body assist Assist for lower body dressing: Minimal Assistance - Patient > 75%     Toileting Toileting Toileting Activity did not occur (Clothing management and hygiene only): N/A (no void or bm)  Toileting assist Assist for toileting: Independent with assistive device Assistive Device Comment: walker   Transfers Chair/bed transfer  Transfers assist     Chair/bed transfer assist level: Supervision/Verbal cueing Chair/bed transfer assistive device: Programmer, multimedia   Ambulation assist      Assist level: Supervision/Verbal cueing Assistive device: Walker-rolling Max distance: 173ft   Walk 10 feet activity   Assist     Assist level: Supervision/Verbal cueing Assistive device: Walker-rolling   Walk 50 feet activity   Assist Walk 50 feet with 2 turns activity did not occur: Safety/medical concerns  Assist level: Supervision/Verbal cueing Assistive device: Walker-rolling  Walk 150 feet activity   Assist Walk 150 feet activity did not occur: Safety/medical concerns  Assist level: Supervision/Verbal cueing Assistive device: Walker-rolling    Walk 10 feet on uneven surface  activity   Assist Walk 10 feet on uneven surfaces activity did not occur:  Safety/medical concerns         Wheelchair     Assist Will patient use wheelchair at discharge?: Yes Type of Wheelchair: Manual    Wheelchair assist level: Contact Guard/Touching assist Max wheelchair distance: 100'    Wheelchair 50 feet with 2 turns activity    Assist        Assist Level: Contact Guard/Touching assist   Wheelchair 150 feet activity     Assist  Wheelchair 150 feet activity did not occur: Safety/medical concerns       Blood pressure 119/66, pulse 66, temperature 98.2 F (36.8 C), temperature source Oral, resp. rate 16, height 5' (1.524 m), weight 67.6 kg, SpO2 99 %.  Medical Problem List and Plan: 1.  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  Status post coil embolization 09/01/2019  Continue CIR PT, OT, SLP  -NS gives patient clearance to fly back to [place] with son  -team conference today. Working toward [employer] of 09/29/19 to accommodate son's travel back to [place]. 2.  Antithrombotics: -DVT/anticoagulation: Pulmonary embolus.  Status post IVC filter 09/02/2019.  Lower extremity Dopplers negative.  pt is ambulating 150+ ft and is refusing heparin--- will dc heparin             -antiplatelet therapy: Aspirin 325 mg and Plavix 75 mg daily 3. Pain Management: Tylenol as needed.  Topiramate 25mg  daily seems to have helped headaches  Patient with controlled on 6/8   -might be able to dc topamax  4. Mood: Provide emotional support             -antipsychotic agents: N/A 5. Neuropsych: This patient is not fully capable of making decisions on her own behalf.  -Telesitter for safety 6. Skin/Wound Care: Routine skin checks 7. Fluids/Electrolytes/Nutrition:    Labs reviewed from 6/7 8.  Cerebral vasospasm.  Nimotop decreased on 6/5  -use TEDS or Abd binder if needed to support bp  BP improved   9.  2.3 cm right thyroid heterogeneous nodule.  Follow-up outpatient 10.  Tobacco abuse.   Counseling 11.  Post stroke Dysphagia.  Dysphagia #3 thins liquids. Follow-up speech therapy 12.  Urinary retention.     continue urecholine 25 mg tid  6/8 voiding fairly well now. Continent. Will try weaning urecholine soon.  13. Stage II sacral decub- improved 14. Leukocytosis:   resolved  LOS: 12 days A FACE TO [place] 09/22/2019, 10:44 AM

## 2019-09-22 NOTE — Plan of Care (Signed)
  Problem: RH Memory Goal: LTG Patient will use memory compensatory aids to (SLP) Description: LTG:  Patient will use memory compensatory aids to recall biographical/new, daily complex information with cues (SLP) Flowsheets (Taken 09/22/2019 0631) LTG: Patient will use memory compensatory aids to (SLP): Moderate Assistance - Patient 50 - 74% Note: Downgraded due to slow progress   Problem: RH Awareness Goal: LTG: Patient will demonstrate awareness during functional activites type of (SLP) Description: LTG: Patient will demonstrate awareness during functional activites type of (SLP) Flowsheets (Taken 09/22/2019 0631) LTG: Patient will demonstrate awareness during cognitive/linguistic activities with assistance of (SLP): Moderate Assistance - Patient 50 - 74% Note: Downgraded due to slow progress

## 2019-09-22 NOTE — Progress Notes (Signed)
Occupational Therapy Weekly Progress Note  Patient Details  Name: Haley Mayo MRN: [id number] Date of Birth: [birth year]  Beginning of progress report period: Sep 11, 2019 End of progress report period: September 22, 2019  Today's Date: 09/22/2019 OT Individual Time: 0086-7619 OT Individual Time Calculation (min): 57 min    Patient has met 3 of 3 short term goals.  Pt is making steady progress towards OT goals. Pt is at an overall supervision/CGA level for BADL tasks. She continues to have decreased safety awareness and confusion at times also impacting her BADL performance. Continue current POC.  Patient continues to demonstrate the following deficits: muscle weakness, decreased attention, decreased awareness, decreased problem solving, decreased safety awareness and decreased memory and decreased standing balance, decreased postural control and decreased balance strategies and therefore will continue to benefit from skilled OT intervention to enhance overall performance with BADL and Reduce care partner burden.  Patient progressing toward long term goals..  Continue plan of care.  OT Short Term Goals Week 1:  OT Short Term Goal 1 (Week 1): Pt will perform toilet transfers consistently with Min A OT Short Term Goal 1 - Progress (Week 1): Met OT Short Term Goal 2 (Week 1): Pt will thread BLEs into pants with supervision OT Short Term Goal 2 - Progress (Week 1): Met OT Short Term Goal 3 (Week 1): Pt will sequence bathing 11/11 body parts with min verbal cues OT Short Term Goal 3 - Progress (Week 1): Met OT Short Term Goal 4 (Week 1): Pt will perform clothing management with Min A for dynamic balance during toileting OT Short Term Goal 4 - Progress (Week 1): Met Week 2:  OT Short Term Goal 1 (Week 2): Pt will demonstrate improved safety awanress within BADL tasks, requiring only min verba llcues OT Short Term Goal 2 (Week 2): Pt don TED hose with min A OT Short Term Goal 3 (Week 2): Pt  will tolerate stanidng at the sink for 5 mins in prep for BADL Tasks  Skilled Therapeutic Interventions/Progress Updates:    Pt greeted semi-reclined in bed and agreeable to shower this am. Pt completed bed mobility with supervision. Verbal cues for hand placement with sit<>stand with RW, then CGA/supervision for ambulation into bathroom. Pt with decreased safety awareness durng session requiring verbal cues throughout. Bathing completed with overall supervision with CGA for balance when washing peri-area and LEs. Pt ambulated out of bathroom w/ RW and close supervision with verbal cues to sit EOB for dressing tasks. OT educated on donning TED hose with and without friction reducing device. Pt able to don one TED hose, but needed assist for the second one. Pt left semi-reclined in bed with bed alarm on, call bell in reach, and needs met.   Therapy Documentation Precautions:  Precautions Precautions: Fall Precaution Comments: impulsive, L lateral lean Restrictions Weight Bearing Restrictions: No Pain: Denies pain  Therapy/Group: Individual Therapy  [name] 09/22/2019, 8:31 AM

## 2019-09-22 NOTE — Progress Notes (Signed)
Patient ID: Haley Mayo, female   DOB: 1950-06-07, 69 y.o.   MRN: [id number]  Per medica team, pt cam travel to back to [facility] as long as she has a neurosurgeon to assume care needs.   SW met with pt in room to discuss discharge plan to [facility] vs home. Pt is now amenable to d/c to son's home in [place] so she can have additional supports.   SW spoke with pt son Virgel Gess [phone]) to discuss above. SW encouraged pt son to work on finding a Publishing rights manager and PCP, and follow-up with SW; as well as check flights and inform on when he can be here next week.   Loralee Pacas, MSW, [place] Office: [phone] Cell: [phone] Fax: [phone]

## 2019-09-23 ENCOUNTER — Inpatient Hospital Stay (HOSPITAL_COMMUNITY): Payer: Medicare Other

## 2019-09-23 ENCOUNTER — Inpatient Hospital Stay (HOSPITAL_COMMUNITY): Payer: Medicare Other | Admitting: Physical Therapy

## 2019-09-23 ENCOUNTER — Inpatient Hospital Stay (HOSPITAL_COMMUNITY): Payer: Medicare Other | Admitting: Speech Pathology

## 2019-09-23 NOTE — Progress Notes (Signed)
Patient ID: Haley Mayo, female   DOB: 1950/12/17, 69 y.o.   MRN: [id number]  Sw followed up with [name] (p:[phone]/[phone]) who reported they are currently closed. SW encouraged to call back on next business day. SW left message requesting return phone call to discuss referral.   Loralee Pacas, MSW, [place] Office: [phone] Cell: [phone] Fax: [phone]

## 2019-09-23 NOTE — Progress Notes (Signed)
Physical Therapy Session Note  Patient Details  Name: Haley Mayo MRN: [id number] Date of Birth: April 07, 1951  Today's Date: 09/23/2019 PT Individual Time: 0800-0915 PT Individual Time Calculation (min): 75 min   Short Term Goals: Week 2:  PT Short Term Goal 1 (Week 2): STG=LTG due to ELOS.  Skilled Therapeutic Interventions/Progress Updates:    Pt received seated in bed, agreeable to PT session. No complaints of pain. Semi-reclined BP while in bed with no TEDs or ACE wrap: 118/48. Assisted pt with donning thigh-high TEDs and BLE ACE wrap at bed level. Semi-reclined to sitting EOB at Supervision level. Seated BP EOB 108/60. Pt is SBA for sitting balance EOB while eating her breakfast. Pt performs sit to stand and transfers with use of RW at [facility] level during session. Ambulation to/from therapy gym with use of RW at [facility] level, some path deviation with tendency to veer to the L. Pt reports some lightheadedness following ambulation to therapy gym. Seated BP 119/53 and symptoms resolve with seated rest break. Static standing balance with min A and no device while throwing ball at rebounder x 15 reps while in normal stance, Romberg stance, and modified tandem stance. Pt tends to lean anteriorly with LOB, able to correct with verbal cueing. Static stance on airex x 2 min with min to mod A for balance. Pt reports feeling very "uncomfortable" while standing on airex due to balance challenged presented. Dynamic balance with use of rail in hallway and min HHA performing tandem ambulation 2 x 30 ft; BUE support on handrail and min A for lateral stepping L/R x 30 ft each direction. Pt agreeable to stay seated in w/c at end of session, needs in reach, quick release belt and chair alarm in place at end of session.  Therapy Documentation Precautions:  Precautions Precautions: Fall Precaution Comments: impulsive, L lateral lean Restrictions Weight Bearing Restrictions: No   Therapy/Group: Individual  Therapy   Excell Seltzer, PT, DPT  09/23/2019, 12:42 PM

## 2019-09-23 NOTE — Progress Notes (Signed)
Occupational Therapy Session Note  Patient Details  Name: Haley Mayo MRN: [id number] Date of Birth: [birth year]  Today's Date: 09/23/2019 OT Individual Time: 1449-1530 OT Individual Time Calculation (min): 41 min    Short Term Goals: Week 2:  OT Short Term Goal 1 (Week 2): Pt will demonstrate improved safety awanress within BADL tasks, requiring only min verba llcues OT Short Term Goal 2 (Week 2): Pt don TED hose with min A OT Short Term Goal 3 (Week 2): Pt will tolerate stanidng at the sink for 5 mins in prep for BADL Tasks  Skilled Therapeutic Interventions/Progress Updates:    Pt received supine with no c/o pain, agreeable to session. Pt declined ADLs. Pt completed 100 ft of functional mobility with min A, using RW. When removing RUE off RW to fix hair and had LOB to the L with min a to correct. Pt stood and completed functional stepping activity with intermittent UE support on RW to challenge dynamic standing balance. Pt then stood on airex balance pad and completed mini squats with cueing for quad activation and proper form. Pt required CGA for balance. Pt then completed functional mobility with RW around cones to challenge L attention and Rw management overall- min cueing required overall. Pt returned to her room and was left supine with all needs met, bed alarm set.   Therapy Documentation Precautions:  Precautions Precautions: Fall Precaution Comments: impulsive, L lateral lean Restrictions Weight Bearing Restrictions: No   Therapy/Group: Individual Therapy  Curtis Sites 09/23/2019, 6:52 AM

## 2019-09-23 NOTE — Progress Notes (Signed)
Speech Language Pathology Daily Session Note  Patient Details  Name: Haley Mayo MRN: [id number] Date of Birth: 13-Jul-1950  Today's Date: 09/23/2019 SLP Individual Time: 5427-0623 SLP Individual Time Calculation (min): 55 min  Short Term Goals: Week 2: SLP Short Term Goal 1 (Week 2): STGs=LTGs due to ELOS  Skilled Therapeutic Interventions: Skilled treatment session focused on cognitive goals. SLP facilitated session by providing Min A verbal and visual cues for patient to recall events that led to hospitalization. However, patient recalled events from previous PT session with overall supervision level verbal cues. SLP also facilitated session by providing supervision level verbal cues for problem solving and organization during a mildly complex money management task. Mod-Max A verbal and visual cues were needed for organization and problem solving as the complexity progressed. Patient initially requested to sit up in the wheelchair but then quickly changed her mind to the bed to avoid having to utilize the chair alarm. Patient left upright in bed with alarm on and all needs within reach. Continue with current plan of care.      Pain No/Denies Pain  Therapy/Group: Individual Therapy  Ervin Hensley 09/23/2019, 11:29 AM

## 2019-09-23 NOTE — Progress Notes (Signed)
[facility] PHYSICAL MEDICINE & REHABILITATION PROGRESS NOTE   Subjective/Complaints: Denies pain, SOB Sleeping well at night.  Having regular BM.  Continuing to retain urine.   ROS: somewhat limited d/t behavior.   Objective:   No results found. Recent Labs    09/21/19 0948  WBC 7.7  HGB 10.6*  HCT 32.7*  PLT 271   Recent Labs    09/21/19 0948  NA 140  K 3.8  CL 111  CO2 20*  GLUCOSE 102*  BUN 21  CREATININE 0.94  CALCIUM 9.2    Intake/Output Summary (Last 24 hours) at 09/23/2019 0926 Last data filed at 09/22/2019 1700 Gross per 24 hour  Intake 462 ml  Output --  Net 462 ml     Physical Exam: Vital Signs Blood pressure 126/69, pulse 77, temperature 97.8 F (36.6 C), temperature source Oral, resp. rate 16, height 5' (1.524 m), weight 67.7 kg, SpO2 99 %.  Constitutional: No distress . Vital signs reviewed. HEENT: EOMI, oral membranes moist Neck: supple Cardiovascular: RRR without murmur. No JVD    Respiratory/Chest: CTA Bilaterally without wheezes or rales. Normal effort    GI/Abdomen: BS +, non-tender, non-distended Ext: no clubbing, cyanosis, or edema Psych: pleasant and cooperative Skin: intact Musc: No edema in extremities.  No tenderness in extremities. Neurologic: Alert, oriented to self, place with cues/redirection but still confabulates and is tangential at times.  Motor: Bilateral upper extremities: 5/5 proximal distal Bilateral lower extremities: Hip flexion, knee extension 4/5, ankle dorsiflexion 4+/5, unchanged today.  Assessment/Plan: 1. 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  which require 3+ hours per day of interdisciplinary therapy in a comprehensive inpatient rehab setting.  Physiatrist is providing close team supervision and 24 hour management of active medical problems listed below.  Physiatrist and rehab team continue to assess  barriers to discharge/monitor patient progress toward functional and medical goals  Care Tool:  Bathing    Body parts bathed by patient: Right arm, Left arm, Chest, Abdomen, Front perineal area, Buttocks, Right upper leg, Left upper leg, Face, Left lower leg, Right lower leg   Body parts bathed by helper: Right lower leg, Left lower leg     Bathing assist Assist Level: Minimal Assistance - Patient > 75%     Upper Body Dressing/Undressing Upper body dressing   What is the patient wearing?: Pull over shirt    Upper body assist Assist Level: Set up assist    Lower Body Dressing/Undressing Lower body dressing      What is the patient wearing?: Pants     Lower body assist Assist for lower body dressing: Minimal Assistance - Patient > 75%     Toileting Toileting Toileting Activity did not occur (Clothing management and hygiene only): N/A (no void or bm)  Toileting assist Assist for toileting: Independent with assistive device Assistive Device Comment: walker   Transfers Chair/bed transfer  Transfers assist     Chair/bed transfer assist level: Contact Guard/Touching assist Chair/bed transfer assistive device: Other(none)   Locomotion Ambulation   Ambulation assist      Assist level: Minimal Assistance - Patient > 75% Assistive device: No Device Max distance: 46ft   Walk 10 feet activity   Assist     Assist level: Minimal Assistance - Patient > 75% Assistive device: No Device   Walk 50 feet activity   Assist Walk 50 feet with 2 turns activity did not occur: Safety/medical concerns  Assist level: Minimal  Assistance - Patient > 75% Assistive device: No Device    Walk 150 feet activity   Assist Walk 150 feet activity did not occur: Safety/medical concerns  Assist level: Supervision/Verbal cueing Assistive device: Walker-rolling    Walk 10 feet on uneven surface  activity   Assist Walk 10 feet on uneven surfaces activity did not occur:  Safety/medical concerns         Wheelchair     Assist Will patient use wheelchair at discharge?: Yes Type of Wheelchair: Manual    Wheelchair assist level: Contact Guard/Touching assist Max wheelchair distance: 100'    Wheelchair 50 feet with 2 turns activity    Assist        Assist Level: Contact Guard/Touching assist   Wheelchair 150 feet activity     Assist  Wheelchair 150 feet activity did not occur: Safety/medical concerns       Blood pressure 126/69, pulse 77, temperature 97.8 F (36.6 C), temperature source Oral, resp. rate 16, height 5' (1.524 m), weight 67.7 kg, SpO2 99 %.  Medical Problem List and Plan: 1.  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  Status post coil embolization 09/01/2019  Continue CIR PT, OT, SLP  -NS gives patient clearance to fly back to [place] with son   Working toward dc of 09/29/19 to accommodate son's travel back to [place]. 2.  Antithrombotics: -DVT/anticoagulation: Pulmonary embolus.  Status post IVC filter 09/02/2019.  Lower extremity Dopplers negative.  pt is ambulating 150+ ft and is refusing heparin--- will dc heparin             -antiplatelet therapy: Aspirin 325 mg and Plavix 75 mg daily 3. Pain Management: Tylenol as needed.  Topiramate 25mg  daily seems to have helped headaches  Patient with controlled on 6/9   -might be able to dc topamax  4. Mood: Provide emotional support             -antipsychotic agents: N/A 5. Neuropsych: This patient is not fully capable of making decisions on her own behalf.  -Telesitter for safety 6. Skin/Wound Care: Routine skin checks 7. Fluids/Electrolytes/Nutrition:    Labs reviewed from 6/7 8.  Cerebral vasospasm.  Nimotop decreased on 6/5  -use TEDS or Abd binder if needed to support bp  BP improved   9.  2.3 cm right thyroid heterogeneous nodule.  Follow-up outpatient 10.  Tobacco abuse.  Counseling 11.  Post  stroke Dysphagia.  Dysphagia #3 thins liquids. Follow-up speech therapy 12.  Urinary retention.     continue urecholine 25 mg tid  6/9 voiding fairly well now. Continent. Will try weaning urecholine soon.  13. Stage II sacral decub- improved 14. Leukocytosis:   resolved LOS: 13 days A FACE TO FACE EVALUATION WAS PERFORMED  Haley Mayo P Haley Mayo 09/23/2019, 9:26 AM

## 2019-09-24 ENCOUNTER — Inpatient Hospital Stay (HOSPITAL_COMMUNITY): Payer: Medicare Other | Admitting: Occupational Therapy

## 2019-09-24 ENCOUNTER — Inpatient Hospital Stay (HOSPITAL_COMMUNITY): Payer: Medicare Other

## 2019-09-24 ENCOUNTER — Inpatient Hospital Stay (HOSPITAL_COMMUNITY): Payer: Medicare Other | Admitting: Speech Pathology

## 2019-09-24 NOTE — Progress Notes (Signed)
Occupational Therapy Session Note  Patient Details  Name: Haley Mayo MRN: [id number] Date of Birth: 1950/05/20  Today's Date: 09/24/2019  Session 1 OT Individual Time: 4403-4742 OT Individual Time Calculation (min): 57 min   Session 2 OT Individual Time: 5956-3875 OT Individual Time Calculation (min): 60 min    Short Term Goals: Week 2:  OT Short Term Goal 1 (Week 2): Pt will demonstrate improved safety awanress within BADL tasks, requiring only min verba llcues OT Short Term Goal 2 (Week 2): Pt don TED hose with min A OT Short Term Goal 3 (Week 2): Pt will tolerate stanidng at the sink for 5 mins in prep for BADL Tasks  Skilled Therapeutic Interventions/Progress Updates:    Session 1 Pt greeted semi-reclined in bed and agreeable to shower this am. Pt perseverative on moving her bowels before shower. Pt able to complete bed mobility with supervision, supervision with verbal cues for hand placement for sit<>stand with RW. CGA for ambulation throughout session. Pt attempted BM, but unsuccessful. Pt completed bathing at shower level using tub bench. Educated on safety during shower as pt attempted to turn around and prop leg up on tub bench to wash peri-area and lower extremities. Discussed pt's balance deficits and need to modify bathing tasks to decrease risk of falls. Dressing completed at EOB with pt needing min A to don TED hose. Pt returned to bed at end of session and left semi-reclined with bed alarm on, call bell in reach, and needs met.   Session 2 Pt greeted in bed and agreeable to OT treatment session. OT asked pt if she could recall previous therapy session. Pt stated she remembered going somewhere, but was not sure what she did. Pt then located schedule and was able to remember she worked with PT, but had difficulty recalling details of session. OT re-wrapped LEs with ace wraps for BP. Pt then completed stand-pivot to wc with close supervision. UB there-ex using [employer]  arm bike on level 4, 2 sets of 5 minute intervals. Dynamic standing balance on foam block while playing horse shoe toss for balance challenge. Pt ambulated back to wc and returned to room. Pt ambulated into bathroom w/ RW and close supervision with verbal cues when turning to sit on commode. Pt with continent void of bladder. Verbal cues for RW positioning at the sink to wash hands. Pt returned to bed and left semi-reclined in bed with bed alarm on, call bell in reach, and needs met.   Therapy Documentation Precautions:  Precautions Precautions: Fall Precaution Comments: impulsive, L lateral lean Restrictions Weight Bearing Restrictions: No  Pain:  denies pain  Therapy/Group: Individual Therapy  [name] 09/24/2019, 12:06 PM

## 2019-09-24 NOTE — Progress Notes (Signed)
Speech Language Pathology Daily Session Note  Patient Details  Name: ARYIAH MONTEROSSO MRN: [id number] Date of Birth: Feb 19, 1951  Today's Date: 09/24/2019 SLP Individual Time: 1135-1200 SLP Individual Time Calculation (min): 25 min  Short Term Goals: Week 2: SLP Short Term Goal 1 (Week 2): STGs=LTGs due to ELOS  Skilled Therapeutic Interventions: Skilled treatment session focused on cognitive goals. SLP facilitated session by providing overall supervision level verbal cues for selective attention to a task for ~20 minutes in a mildly distracting environment. Patient was also able to alternate her attention and utilize working memory during a complex PEG task in which she had to match a number to a specific color in order to complete a pattern in a visually distracting environment. Patient left upright sitting EOB with alarm on and all needs within reach. Continue with current plan of care.      Pain No/Denies Pain   Therapy/Group: Individual Therapy  Gean Laursen 09/24/2019, 12:37 PM

## 2019-09-24 NOTE — Progress Notes (Signed)
[facility] PHYSICAL MEDICINE & REHABILITATION PROGRESS NOTE   Subjective/Complaints: Complains about urine retention and THT's.   ROS: Limited due to cognitive/behavioral   Objective:   No results found. No results for input(s): WBC, HGB, HCT, PLT in the last 72 hours. No results for input(s): NA, K, CL, CO2, GLUCOSE, BUN, CREATININE, CALCIUM in the last 72 hours.  Intake/Output Summary (Last 24 hours) at 09/24/2019 1135 Last data filed at 09/23/2019 2242 Gross per 24 hour  Intake 240 ml  Output 500 ml  Net -260 ml     Physical Exam: Vital Signs Blood pressure 107/60, pulse 74, temperature 98.7 F (37.1 C), temperature source Oral, resp. rate 18, height 5' (1.524 m), weight 69.1 kg, SpO2 100 %.  Constitutional: No distress . Vital signs reviewed. HEENT: EOMI, oral membranes moist Neck: supple Cardiovascular: RRR without murmur. No JVD    Respiratory/Chest: CTA Bilaterally without wheezes or rales. Normal effort    GI/Abdomen: BS +, non-tender, non-distended Ext: no clubbing, cyanosis, or edema Psych: pleasant and cooperative Skin: intact Musc: No edema in extremities.  No tenderness in extremities. Neurologic: Alert, oriented to self, place with cues/redirection but still confabulates and is tangential at times.  Motor: Bilateral upper extremities: 5/5 proximal distal Bilateral lower extremities: Hip flexion, knee extension 4/5, ankle dorsiflexion 4+/5, unchanged today.  Assessment/Plan: 1. 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  which require 3+ hours per day of interdisciplinary therapy in a comprehensive inpatient rehab setting.  Physiatrist is providing close team supervision and 24 hour management of active medical problems listed below.  Physiatrist and rehab team continue to assess barriers to discharge/monitor patient progress toward functional and medical  goals  Care Tool:  Bathing    Body parts bathed by patient: Right arm, Left arm, Chest, Abdomen, Front perineal area, Buttocks, Right upper leg, Left upper leg, Face, Left lower leg, Right lower leg   Body parts bathed by helper: Right lower leg, Left lower leg     Bathing assist Assist Level: Minimal Assistance - Patient > 75%     Upper Body Dressing/Undressing Upper body dressing   What is the patient wearing?: Pull over shirt    Upper body assist Assist Level: Set up assist    Lower Body Dressing/Undressing Lower body dressing      What is the patient wearing?: Pants     Lower body assist Assist for lower body dressing: Minimal Assistance - Patient > 75%     Toileting Toileting Toileting Activity did not occur (Clothing management and hygiene only): N/A (no void or bm)  Toileting assist Assist for toileting: Independent with assistive device Assistive Device Comment: walker   Transfers Chair/bed transfer  Transfers assist     Chair/bed transfer assist level: Contact Guard/Touching assist Chair/bed transfer assistive device: Programmer, multimedia   Ambulation assist      Assist level: Contact Guard/Touching assist Assistive device: Walker-rolling Max distance: 150'   Walk 10 feet activity   Assist     Assist level: Contact Guard/Touching assist Assistive device: Walker-rolling   Walk 50 feet activity   Assist Walk 50 feet with 2 turns activity did not occur: Safety/medical concerns  Assist level: Contact Guard/Touching assist Assistive device: Walker-rolling    Walk 150 feet activity   Assist Walk 150 feet activity did not occur: Safety/medical concerns  Assist level: Contact Guard/Touching assist Assistive device: Walker-rolling    Walk 10 feet on uneven  surface  activity   Assist Walk 10 feet on uneven surfaces activity did not occur: Safety/medical concerns         Wheelchair     Assist Will patient use  wheelchair at discharge?: Yes Type of Wheelchair: Manual    Wheelchair assist level: Contact Guard/Touching assist Max wheelchair distance: 100'    Wheelchair 50 feet with 2 turns activity    Assist        Assist Level: Contact Guard/Touching assist   Wheelchair 150 feet activity     Assist  Wheelchair 150 feet activity did not occur: Safety/medical concerns       Blood pressure 107/60, pulse 74, temperature 98.7 F (37.1 C), temperature source Oral, resp. rate 18, height 5' (1.524 m), weight 69.1 kg, SpO2 100 %.  Medical Problem List and Plan: 1.  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  Status post coil embolization 09/01/2019  Continue CIR PT, OT, SLP  -NS gives patient clearance to fly back to [place] with son   Working toward dc of 09/29/19 to accommodate son's travel back to [place]. 2.  Antithrombotics: -DVT/anticoagulation: Pulmonary embolus.  Status post IVC filter 09/02/2019.  Lower extremity Dopplers negative.  pt is ambulating 150+ ft and is refusing heparin--- will dc heparin             -antiplatelet therapy: Aspirin 325 mg and Plavix 75 mg daily 3. Pain Management: Tylenol as needed.  Topiramate 25mg  daily seems to have helped headaches  Patient with controlled on 6/10--will dc and observe      4. Mood: Provide emotional support             -antipsychotic agents: N/A 5. Neuropsych: This patient is not fully capable of making decisions on her own behalf.  -Telesitter for safety 6. Skin/Wound Care: Routine skin checks 7. Fluids/Electrolytes/Nutrition:    Labs reviewed from 6/7 8.  Cerebral vasospasm.  Nimotop decreased on 6/5  -use TEDS or Abd binder if needed to support bp  BP improved 6/10 9.  2.3 cm right thyroid heterogeneous nodule.  Follow-up outpatient 10.  Tobacco abuse.  Counseling 11.  Post stroke Dysphagia.  Dysphagia #3 thins liquids. Follow-up speech therapy 12.   Urinary retention.     continue urecholine 25 mg tid  6/10 improving.  occ I/O cath. Encourage OOB to void 13. Stage II sacral decub- improved 14. Leukocytosis:   resolved LOS: 14 days A FACE TO [place] 09/24/2019, 11:35 AM

## 2019-09-24 NOTE — Progress Notes (Addendum)
Physical Therapy Session Note  Patient Details  Name: Haley Mayo MRN: [id number] Date of Birth: 06/01/1950  Today's Date: 09/24/2019 PT Individual Time: 0905-1000 PT Individual Time Calculation (min): 55 min  Short Term Goals: Week 2:  PT Short Term Goal 1 (Week 2): STG=LTG due to ELOS.  Skilled Therapeutic Interventions/Progress Updates:   Pt received seated in W/C after toileting with RN on arrival. Agreeable to therapy. Denies any pain. Thigh high TED hose in place to BLEs. Seated in W/C, BP is 123/55 with a HR of 75. Performed sit <> stand with RW at [facility] level throughout session. SPT with RW performed at [facility] level throughout session. Pt is independent with bed mobility. After discussion with Dr. Naaman Plummer, thigh high TED hose replaced with knee high TED hose d/t pt discomfort. ACE wrap applied to BLEs by therapist. Pt ambulated 150 feet to/from the therapy gym with RW and CGA. Performed forward step up followed by backwards step down on a 4 inch step with RW and CGA x8 each leg in order to strengthen and increase neuromuscular control of LEs. While pt initially struggled with sequencing of her feet, she was able to successfully perform the exercise when verbalizing the sequence out loud. Occasional tactile cues required to correct forward trunk lean. As pt fatigued, she began to experience dizziness. Seated BP was 125/67 with a HR of 78. Dizziness resolved with sitting. Pt performed static standing balance in normal stance with min A and no device x2 minutes on Airex pad. Pt tends to lean anteriorly with LOB but is able to correct this with verbal and tactile cues. Pt requires external cues to promote posture and redirect attention to task. As pt fatigued, she began to experience lightheadedness. Seated BP was 113/71 with a HR of 75. Lightheadedness resolved with sitting. Pt performed dynamic standing balance in normal stance with no device and min A on a level, flat surface. Pt was able to  pick up 3 cones (placed medial, anterior, lateral) off the floor and hand them to therapist at edge of BOS. Pt then grabbed the cones from therapist and placed them on the floor. Pt repeated this 4 times. Pt was able to place the cones in the same location and sequence as she picked them up. Verbal cues required to encourage squatting as pt picked up the cones. Pt did c/o mild LBP (pt points to lumbar paraspinal region) after performing this exercise, which she states she has experienced in the past. Pt left supine in bed with call bell and all needs in reach. All questions answered.     Therapy Documentation Precautions:  Precautions Precautions: Fall Precaution Comments: impulsive, L lateral lean Restrictions Weight Bearing Restrictions: No  Therapy/Group: Individual Therapy  Nadeen Landau, SPT  09/24/2019, 4:02 PM    This entire session was performed under the direct supervision and direction of a licensed physical therapist. I have personally read, edited and approve of the note as written.  Apolinar Junes, PT, DPT 09/24/19    4:50 PM

## 2019-09-24 NOTE — Plan of Care (Signed)
  Problem: Consults Goal: RH BRAIN INJURY PATIENT EDUCATION Description: Description: See Patient Education module for eduction specifics Outcome: Progressing Goal: Skin Care Protocol Initiated - if Braden Score 18 or less Description: If consults are not indicated, leave blank or document N/A Outcome: Progressing   Problem: RH BOWEL ELIMINATION Goal: RH STG MANAGE BOWEL WITH ASSISTANCE Description: STG Manage Bowel with mod I Assistance. Outcome: Progressing   Problem: RH BLADDER ELIMINATION Goal: RH STG MANAGE BLADDER WITH ASSISTANCE Description: STG Manage Bladder With mod I Assistance Outcome: Progressing

## 2019-09-24 NOTE — Progress Notes (Signed)
Pt asked to defer doing I&O cath until she could have a cup of coffee. Pt given coffee and stated it will take her some time to finish coffee. Pt would like to try to void after breakfast will make next shift aware.

## 2019-09-25 ENCOUNTER — Inpatient Hospital Stay (HOSPITAL_COMMUNITY): Payer: Medicare Other | Admitting: Physical Therapy

## 2019-09-25 ENCOUNTER — Inpatient Hospital Stay (HOSPITAL_COMMUNITY): Payer: Medicare Other

## 2019-09-25 ENCOUNTER — Inpatient Hospital Stay (HOSPITAL_COMMUNITY): Payer: Medicare Other | Admitting: Occupational Therapy

## 2019-09-25 MED ORDER — BETHANECHOL CHLORIDE 25 MG PO TABS
50.0000 mg | ORAL_TABLET | Freq: Three times a day (TID) | ORAL | Status: DC
  Administered 2019-09-25 – 2019-10-01 (×18): 50 mg via ORAL
  Filled 2019-09-25 (×19): qty 2

## 2019-09-25 NOTE — Progress Notes (Signed)
Occupational Therapy Session Note  Patient Details  Name: Haley Mayo MRN: [id number] Date of Birth: [birth year]  Today's Date: 09/25/2019 OT Individual Time: 1047-1200 OT Individual Time Calculation (min): 73 min   4Short Term Goals: Week 2:  OT Short Term Goal 1 (Week 2): Pt will demonstrate improved safety awanress within BADL tasks, requiring only min verba llcues OT Short Term Goal 2 (Week 2): Pt don TED hose with min A OT Short Term Goal 3 (Week 2): Pt will tolerate stanidng at the sink for 5 mins in prep for BADL Tasks  Skilled Therapeutic Interventions/Progress Updates:    Pt greeted seated EOB with nurse tech after going to the bathroom, and agreeable to OT treatment session. Pt wanted to finish eating some apple sauce prior to going to the gym. Pt declined bathing/dressing today. OT reviewed modified technique to don TED hose using friction reducing bag. Pt able to place bag and don Ted hose with feet propped on bed and min A to remove bag. Pt ambulated in room with RW and close supervision to get to the wc. Pt brought down to therapy gym and complete Box and Block test and 9 Hole Peg Test with results detailed below. Pt with difficulty recalling directions and needed multiple trials to start and stop test as she would pick up multiple cubes at a time. Pt completed pipe tree puzzle for problem solving skills with min verbal cues. UB there-ex with 2 lb dowel rod and ball toss press. Hand eye coordination and trunk rotation with ball toss and oblique twist, 3 sets of 15. Pt returned to room and left seated in wc with alarm belt on, call bell in reach, and needs met.   B&B R: 42 L: 31  9-Hole Peg R: 30 seconds L:32 seconds  Therapy Documentation Precautions:  Precautions Precautions: Fall Precaution Comments: impulsive, L lateral lean Restrictions Weight Bearing Restrictions: No Pain:  denies pain   Therapy/Group: Individual Therapy  [name] 09/25/2019,  11:37 AM

## 2019-09-25 NOTE — Progress Notes (Signed)
[facility] PHYSICAL MEDICINE & REHABILITATION PROGRESS NOTE   Subjective/Complaints: No new issues. Did sleep last night  ROS: Limited due to cognitive/behavioral   Objective:   No results found. No results for input(s): WBC, HGB, HCT, PLT in the last 72 hours. No results for input(s): NA, K, CL, CO2, GLUCOSE, BUN, CREATININE, CALCIUM in the last 72 hours.  Intake/Output Summary (Last 24 hours) at 09/25/2019 1055 Last data filed at 09/25/2019 0700 Gross per 24 hour  Intake 880 ml  Output 450 ml  Net 430 ml     Physical Exam: Vital Signs Blood pressure 127/68, pulse 71, temperature 98.7 F (37.1 C), temperature source Oral, resp. rate 20, height 5' (1.524 m), weight 69.1 kg, SpO2 96 %.  Constitutional: No distress . Vital signs reviewed. HEENT: EOMI, oral membranes moist Neck: supple Cardiovascular: RRR without murmur. No JVD    Respiratory/Chest: CTA Bilaterally without wheezes or rales. Normal effort    GI/Abdomen: BS +, non-tender, non-distended Ext: no clubbing, cyanosis, or edema Psych: pleasant and cooperative Skin: intact Musc: No edema in extremities.  No tenderness in extremities. Neurologic: alert, displays insight at times but still can be tangential and demonstrates LOC  Motor: Bilateral upper extremities: 5/5 proximal distal Bilateral lower extremities: Hip flexion, knee extension-->distal 4+/5  Assessment/Plan: 1. 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  which require 3+ hours per day of interdisciplinary therapy in a comprehensive inpatient rehab setting.  Physiatrist is providing close team supervision and 24 hour management of active medical problems listed below.  Physiatrist and rehab team continue to assess barriers to discharge/monitor patient progress toward functional and medical goals  Care Tool:  Bathing    Body parts bathed by patient: Right  arm, Left arm, Chest, Abdomen, Front perineal area, Buttocks, Right upper leg, Left upper leg, Face, Left lower leg, Right lower leg   Body parts bathed by helper: Right lower leg, Left lower leg     Bathing assist Assist Level: Contact Guard/Touching assist     Upper Body Dressing/Undressing Upper body dressing   What is the patient wearing?: Pull over shirt    Upper body assist Assist Level: Set up assist    Lower Body Dressing/Undressing Lower body dressing      What is the patient wearing?: Pants, Underwear/pull up     Lower body assist Assist for lower body dressing: Contact Guard/Touching assist     Toileting Toileting Toileting Activity did not occur (Clothing management and hygiene only): N/A (no void or bm)  Toileting assist Assist for toileting: Contact Guard/Touching assist Assistive Device Comment: walker   Transfers Chair/bed transfer  Transfers assist     Chair/bed transfer assist level: Contact Guard/Touching assist Chair/bed transfer assistive device: Programmer, multimedia   Ambulation assist      Assist level: Contact Guard/Touching assist Assistive device: Walker-rolling Max distance: 150'   Walk 10 feet activity   Assist     Assist level: Contact Guard/Touching assist Assistive device: Walker-rolling   Walk 50 feet activity   Assist Walk 50 feet with 2 turns activity did not occur: Safety/medical concerns  Assist level: Contact Guard/Touching assist Assistive device: Walker-rolling    Walk 150 feet activity   Assist Walk 150 feet activity did not occur: Safety/medical concerns  Assist level: Contact Guard/Touching assist Assistive device: Walker-rolling    Walk 10 feet on uneven surface  activity   Assist Walk 10 feet on uneven surfaces  activity did not occur: Safety/medical concerns         Wheelchair     Assist Will patient use wheelchair at discharge?: Yes Type of Wheelchair: Manual     Wheelchair assist level: Contact Guard/Touching assist Max wheelchair distance: 100'    Wheelchair 50 feet with 2 turns activity    Assist        Assist Level: Contact Guard/Touching assist   Wheelchair 150 feet activity     Assist  Wheelchair 150 feet activity did not occur: Safety/medical concerns       Blood pressure 127/68, pulse 71, temperature 98.7 F (37.1 C), temperature source Oral, resp. rate 20, height 5' (1.524 m), weight 69.1 kg, SpO2 96 %.  Medical Problem List and Plan: 1.  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  Status post coil embolization 09/01/2019  Continue CIR PT, OT, SLP  -NS gives patient clearance to fly back to [place] with son   Working toward dc of 09/29/19 to accommodate son's travel back to . 2.  Antithrombotics: -DVT/anticoagulation: Pulmonary embolus.  Status post IVC filter 09/02/2019.  Lower extremity Dopplers negative.  pt is ambulating 150+ ft and is refusing heparin--- will dc heparin             -antiplatelet therapy: Aspirin 325 mg and Plavix 75 mg daily 3. Pain Management: Tylenol as needed.  -observing HA off topamax      4. Mood: Provide emotional support             -antipsychotic agents: N/A 5. Neuropsych: This patient is not fully capable of making decisions on her own behalf.  -Telesitter for safety 6. Skin/Wound Care: Routine skin checks 7. Fluids/Electrolytes/Nutrition:    Labs reviewed from 6/7 8.  Cerebral vasospasm.  Nimotop decreased on 6/5  -use TEDS or Abd binder if needed to support bp  BP improved 6/10-11 9.  2.3 cm right thyroid heterogeneous nodule.  Follow-up outpatient 10.  Tobacco abuse.  Counseling 11.  Post stroke Dysphagia.  Dysphagia #3 thins liquids. Follow-up speech therapy 12.  Urinary retention.  still req I/O cath at times   6/11 Increase urecholine to 50 mg tid   Encourage OOB to void 13. Stage II sacral decub-  improved 14. Leukocytosis:   resolved LOS: 15 days A FACE TO FACE EVALUATION WAS PERFORMED  Meredith Staggers 09/25/2019, 10:55 AM

## 2019-09-25 NOTE — Progress Notes (Signed)
Physical Therapy Session Note  Patient Details  Name: Haley Mayo MRN: [id number] Date of Birth: 11/08/1950  Today's Date: 09/25/2019 PT Individual Time: 6433-2951 PT Individual Time Calculation (min): 57 min   Short Term Goals: Week 1:  PT Short Term Goal 1 (Week 1): Pt will transfer sup to sit w/ min A . PT Short Term Goal 1 - Progress (Week 1): Met PT Short Term Goal 2 (Week 1): Pt will transfer sit to stand and SPT  w/ min A PT Short Term Goal 2 - Progress (Week 1): Met PT Short Term Goal 3 (Week 1): Pt will amb w/ RW and min A up to 50'. PT Short Term Goal 3 - Progress (Week 1): Met Week 2:  PT Short Term Goal 1 (Week 2): STG=LTG due to ELOS.  Skilled Therapeutic Interventions/Progress Updates:   Received pt supine in bed, pt agreeable to therapy, and denied any pain during session. Session with emphasis on functional mobility/transfers, toileting, generalized strengthening, dynamic standing balance/coordination, ambulation, and improved activity tolerance. Pt with ted hose already donned and PT donned bilateral ace wraps with total A. BP sitting in bed with ted hose and ace wrapped donned 125/66 and HR 77 bpm and pt asymptomatic. Pt ambulated 53f to toilet with RW CGA and able to perform clothing management with CGA. Pt able to void and perform hygiene management with supervision. Pt stood at sink and washed hands with CGA. Pt ambulated 1584fwith RW CGA to/from therapy gym. Pt reported lightheadedness and BP 125/69 and HR 84bpm; resolved within 3 minutes sitting on mat. Pt curious about horseshoes and worked on dyTour managerithout AD and CGA/min A x 3 trials with cues for upright posture and technique. Pt easily distracted by other people in gym, distractions in hallways, and food truck outside window requiring mod cues for redirection and focus. Pt ambulated 5030fith RW CGA to staircase and navigated 4 steps with 2 rails CGA ascending with a step  through pattern and descending with a step to pattern. Pt reported urge to have BM and ambulated [id number] RW CGA to bathroom, performed toilet transfer with RW, and able to manage clothing with CGA. Pt with medium BM and able to perform hygiene management with supervision. Pt stood at sink and washed hands with supervision and ambulated [id number] RW CGA back to room. Pt doffed ace wraps and ted hose with supervision and increased time and transferred sit<>supine with supervision. Concluded session with pt supine in bed, needs within reach, and bed alarm on.   Therapy Documentation Precautions:  Precautions Precautions: Fall Precaution Comments: impulsive, L lateral lean Restrictions Weight Bearing Restrictions: No  Therapy/Group: Individual Therapy Kelise Kuch Alfonse AlpersDPT   09/25/2019, 7:24 AM

## 2019-09-25 NOTE — Progress Notes (Signed)
Speech Language Pathology Weekly Progress Note  Patient Details  Name: Haley Mayo MRN: [id number] Date of Birth: June 07, 1950  Beginning of progress report period: September 18, 2019 End of progress report period: September 25, 2019  Short Term Goals: Week 2: SLP Short Term Goal 1 (Week 2): STGs=LTGs due to ELOS SLP Short Term Goal 1 - Progress (Week 2): Not met    New Short Term Goals: Week 3: SLP Short Term Goal 1 (Week 3): STGS=LTGs due to ELOS  Weekly Progress Updates: Patient continues to demonstrate improved cognitive and swallowing function this reporting period. Currently, patient is consuming regular textures with thin liquids without overt s/s of aspiration and is overall Mod I for use of swallowing compensatory strategies. Patient also requires overall Min-Mod A verbal cues for sustained attention, functional problem solving, emergent awareness and recall with use of visual aids. Patient education is ongoing and patient's expected discharge plan is to return to [place] with her son so he can provided 24 hour supervision. Patient would benefit from continued skilled SLP intervention to maximize her cognitive functioning and overall functional independence prior to discharge.    Intensity: Minumum of 1-2 x/day, 30 to 90 minutes Frequency: 3 to 5 out of 7 days Duration/Length of Stay: 09/29/19 Treatment/Interventions: Cognitive remediation/compensation;Internal/external aids;Therapeutic Activities;Environmental controls;Cueing hierarchy;Functional tasks;Patient/family education;Dysphagia/aspiration precaution training    Sandor Arboleda, [place] 09/25/2019, 6:45 AM

## 2019-09-25 NOTE — Plan of Care (Signed)
  Problem: Consults Goal: RH BRAIN INJURY PATIENT EDUCATION Description: Description: See Patient Education module for eduction specifics Outcome: Progressing Goal: Skin Care Protocol Initiated - if Braden Score 18 or less Description: If consults are not indicated, leave blank or document N/A Outcome: Progressing   Problem: RH BOWEL ELIMINATION Goal: RH STG MANAGE BOWEL WITH ASSISTANCE Description: STG Manage Bowel with mod I Assistance. Outcome: Progressing   Problem: RH BLADDER ELIMINATION Goal: RH STG MANAGE BLADDER WITH ASSISTANCE Description: STG Manage Bladder With mod I Assistance Outcome: Progressing Goal: RH STG MANAGE BLADDER WITH MEDICATION WITH ASSISTANCE Description: STG Manage Bladder With Medication With mod I Assistance. Outcome: Progressing   Problem: RH SKIN INTEGRITY Goal: RH STG SKIN FREE OF INFECTION/BREAKDOWN Description: Pt will be free of skin breakdown prior to DC with min assist  Outcome: Progressing Goal: RH STG MAINTAIN SKIN INTEGRITY WITH ASSISTANCE Description: STG Maintain Skin Integrity With min Assistance. Outcome: Progressing   Problem: RH SAFETY Goal: RH STG ADHERE TO SAFETY PRECAUTIONS W/ASSISTANCE/DEVICE Description: STG Adhere to Safety Precautions With cues/reminders Assistance/Device. Outcome: Progressing   Problem: RH KNOWLEDGE DEFICIT BRAIN INJURY Goal: RH STG INCREASE KNOWLEDGE OF SELF CARE AFTER BRAIN INJURY Description: Pt will be able to demonstrate knowledge of fall prevention, safety precautions, medication management, and disease process with mod I assist using handouts/booklets prior to DC Outcome: Progressing

## 2019-09-25 NOTE — Plan of Care (Signed)
  Problem: Consults Goal: RH BRAIN INJURY PATIENT EDUCATION Description: Description: See Patient Education module for eduction specifics Outcome: Progressing   Problem: RH BOWEL ELIMINATION Goal: RH STG MANAGE BOWEL WITH ASSISTANCE Description: STG Manage Bowel with mod I Assistance. Outcome: Progressing   Problem: RH BLADDER ELIMINATION Goal: RH STG MANAGE BLADDER WITH ASSISTANCE Description: STG Manage Bladder With mod I Assistance Outcome: Progressing

## 2019-09-25 NOTE — Progress Notes (Signed)
Physical Therapy Session Note  Patient Details  Name: Haley Mayo MRN: [id number] Date of Birth: 1951-02-25  Today's Date: 09/25/2019 PT Individual Time: 0177-9390 PT Individual Time Calculation (min): 42 min   Short Term Goals: Week 2:  PT Short Term Goal 1 (Week 2): STG=LTG due to ELOS.  Skilled Therapeutic Interventions/Progress Updates:   Pt received supine in bed and agreeable to PT. Supine>sit transfer without assist or cues. PT applied knee high Teds and ace wrap to BLE. Sit<>stand from EOB with supervision assist. No s/s of orthostatic hypotension. Pt transported to atrium in WC. Gait training with RW x 249f and 2029fwith supervision assist and min cues for attention to task and safety with AD management in tight spaces and in turns. Prolonged rest breaks bewteen bouts due to mild fatigue. Pt returned to room and performed ambulatory transfer to bed with RW and supervision assist. . Sit>supine completed without assist. PT doffed teds and Ace wrap once pt in bed. Pt left supine in bed with call bell in reach and all needs met.        Therapy Documentation Precautions:  Precautions Precautions: Fall Precaution Comments: impulsive, L lateral lean Restrictions Weight Bearing Restrictions: No    Pain: denies   Therapy/Group: Individual Therapy  AuLorie Phenix/02/2020, 4:13 PM

## 2019-09-26 NOTE — Progress Notes (Signed)
[place] PHYSICAL MEDICINE & REHABILITATION PROGRESS NOTE   Subjective/Complaints: Had a reasonable night. No complaints this morning  ROS: Patient denies fever, rash, sore throat, blurred vision, nausea, vomiting, diarrhea, cough, shortness of breath or chest pain, joint or back pain, headache, or mood change.     Objective:   No results found. No results for input(s): WBC, HGB, HCT, PLT in the last 72 hours. No results for input(s): NA, K, CL, CO2, GLUCOSE, BUN, CREATININE, CALCIUM in the last 72 hours.  Intake/Output Summary (Last 24 hours) at 09/26/2019 1152 Last data filed at 09/26/2019 0900 Gross per 24 hour  Intake 500 ml  Output 550 ml  Net -50 ml     Physical Exam: Vital Signs Blood pressure 128/73, pulse 70, temperature 98 F (36.7 C), temperature source Oral, resp. rate 18, height 5' (1.524 m), weight 68.4 kg, SpO2 96 %.  Constitutional: No distress . Vital signs reviewed. HEENT: EOMI, oral membranes moist Neck: supple Cardiovascular: RRR without murmur. No JVD    Respiratory/Chest: CTA Bilaterally without wheezes or rales. Normal effort    GI/Abdomen: BS +, non-tender, non-distended Ext: no clubbing, cyanosis, or edema Psych: a little anxious and distracted per baseline.  Skin: intact Musc: No edema in extremities.  No tenderness in extremities. Neurologic: alert,  still can be tangential and demonstrates LOC too  Motor: Bilateral upper extremities: 5/5 proximal distal Bilateral lower extremities: Hip flexion, knee extension-->distal 4+/5  Assessment/Plan: 1. 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  which require 3+ hours per day of interdisciplinary therapy in a comprehensive inpatient rehab setting.  Physiatrist is providing close team supervision and 24 hour management of active medical problems listed below.  Physiatrist and rehab team continue to assess  barriers to discharge/monitor patient progress toward functional and medical goals  Care Tool:  Bathing    Body parts bathed by patient: Right arm, Left arm, Chest, Abdomen, Front perineal area, Buttocks, Right upper leg, Left upper leg, Face, Left lower leg, Right lower leg   Body parts bathed by helper: Right lower leg, Left lower leg     Bathing assist Assist Level: Contact Guard/Touching assist     Upper Body Dressing/Undressing Upper body dressing   What is the patient wearing?: Pull over shirt    Upper body assist Assist Level: Set up assist    Lower Body Dressing/Undressing Lower body dressing      What is the patient wearing?: Pants, Underwear/pull up     Lower body assist Assist for lower body dressing: Contact Guard/Touching assist     Toileting Toileting Toileting Activity did not occur (Clothing management and hygiene only): N/A (no void or bm)  Toileting assist Assist for toileting: Contact Guard/Touching assist Assistive Device Comment: walker   Transfers Chair/bed transfer  Transfers assist     Chair/bed transfer assist level: Contact Guard/Touching assist Chair/bed transfer assistive device: Programmer, multimedia   Ambulation assist      Assist level: Contact Guard/Touching assist Assistive device: Walker-rolling Max distance: 150'   Walk 10 feet activity   Assist     Assist level: Contact Guard/Touching assist Assistive device: Walker-rolling   Walk 50 feet activity   Assist Walk 50 feet with 2 turns activity did not occur: Safety/medical concerns  Assist level: Contact Guard/Touching assist Assistive device: Walker-rolling    Walk 150 feet activity   Assist Walk 150 feet activity did not occur: Safety/medical concerns  Assist  level: Contact Guard/Touching assist Assistive device: Walker-rolling    Walk 10 feet on uneven surface  activity   Assist Walk 10 feet on uneven surfaces activity did not occur:  Safety/medical concerns         Wheelchair     Assist Will patient use wheelchair at discharge?: Yes Type of Wheelchair: Manual    Wheelchair assist level: Contact Guard/Touching assist Max wheelchair distance: 100'    Wheelchair 50 feet with 2 turns activity    Assist        Assist Level: Contact Guard/Touching assist   Wheelchair 150 feet activity     Assist  Wheelchair 150 feet activity did not occur: Safety/medical concerns       Blood pressure 128/73, pulse 70, temperature 98 F (36.7 C), temperature source Oral, resp. rate 18, height 5' (1.524 m), weight 68.4 kg, SpO2 96 %.  Medical Problem List and Plan: 1.  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  Status post coil embolization 09/01/2019  Continue CIR PT, OT, SLP  -NS gives patient clearance to fly back to [place] with son   Working toward dc of 09/29/19 to accommodate son's travel back to [place]. 2.  Antithrombotics: -DVT/anticoagulation: Pulmonary embolus.  Status post IVC filter 09/02/2019.  Lower extremity Dopplers negative.  pt is ambulating 150+ ft and is refusing heparin--- will dc heparin             -antiplatelet therapy: Aspirin 325 mg and Plavix 75 mg daily 3. Pain Management: Tylenol as needed.  -observing HA off topamax      4. Mood: Provide emotional support             -antipsychotic agents: N/A 5. Neuropsych: This patient is not fully capable of making decisions on her own behalf.  -Telesitter for safety 6. Skin/Wound Care: Routine skin checks 7. Fluids/Electrolytes/Nutrition:    Recent labs 6/7 wnl, recheck tomorrow 8.  Cerebral vasospasm.  Nimotop decreased on 6/5  -use TEDS or Abd binder if needed to support bp  BP improved 6/10-11 9.  2.3 cm right thyroid heterogeneous nodule.  Follow-up outpatient 10.  Tobacco abuse.  Counseling 11.  Post stroke Dysphagia.  Dysphagia #3 thins liquids. Follow-up speech  therapy 12.  Urinary retention.  still req I/O cath at times but less so  6/11 Increased urecholine to 50 mg tid   Encourage OOB to void 13. Stage II sacral decub- improved 14. Leukocytosis:   resolved LOS: 16 days A FACE TO FACE EVALUATION WAS PERFORMED  Meredith Staggers 09/26/2019, 11:52 AM

## 2019-09-26 NOTE — Plan of Care (Signed)
  Problem: Consults Goal: RH BRAIN INJURY PATIENT EDUCATION Description: Description: See Patient Education module for eduction specifics Outcome: Progressing Goal: Skin Care Protocol Initiated - if Braden Score 18 or less Description: If consults are not indicated, leave blank or document N/A Outcome: Progressing   Problem: RH BOWEL ELIMINATION Goal: RH STG MANAGE BOWEL WITH ASSISTANCE Description: STG Manage Bowel with mod I Assistance. Outcome: Progressing   Problem: RH BLADDER ELIMINATION Goal: RH STG MANAGE BLADDER WITH ASSISTANCE Description: STG Manage Bladder With mod I Assistance Outcome: Progressing Goal: RH STG MANAGE BLADDER WITH MEDICATION WITH ASSISTANCE Description: STG Manage Bladder With Medication With mod I Assistance. Outcome: Progressing   Problem: RH SKIN INTEGRITY Goal: RH STG SKIN FREE OF INFECTION/BREAKDOWN Description: Pt will be free of skin breakdown prior to DC with min assist  Outcome: Progressing Goal: RH STG MAINTAIN SKIN INTEGRITY WITH ASSISTANCE Description: STG Maintain Skin Integrity With min Assistance. Outcome: Progressing   Problem: RH SAFETY Goal: RH STG ADHERE TO SAFETY PRECAUTIONS W/ASSISTANCE/DEVICE Description: STG Adhere to Safety Precautions With cues/reminders Assistance/Device. Outcome: Progressing   Problem: RH KNOWLEDGE DEFICIT BRAIN INJURY Goal: RH STG INCREASE KNOWLEDGE OF SELF CARE AFTER BRAIN INJURY Description: Pt will be able to demonstrate knowledge of fall prevention, safety precautions, medication management, and disease process with mod I assist using handouts/booklets prior to DC Outcome: Progressing

## 2019-09-27 ENCOUNTER — Inpatient Hospital Stay (HOSPITAL_COMMUNITY): Payer: Medicare Other | Admitting: Occupational Therapy

## 2019-09-27 NOTE — Progress Notes (Signed)
Physical Therapy Weekly Progress Note  Patient Details  Name: Haley Mayo MRN: [id number] Date of Birth: 08/28/1950  Beginning of progress report period: September 20, 2019 End of progress report period: September 27, 2019  Today's Date: 09/27/2019   Patient is progressing well towards long term goals.  Patient with prolonged stay due to decreased family support and requiring 24/7 assist for safety/cognition at d/c. Patient's son to fly in from [place] on or around 6/16 to bring patient back to [place] per [place]. Patient currently requires Supervision for bed mobility, transfers, and gait >250 ft with RW, and CGA stairs with B rails. Patient continuing to be tangential with poor attention throughout sessions, improving with min cues for attention/redirection this week.   Patient continues to demonstrate the following deficits muscle weakness, decreased cardiorespiratoy endurance, decreased coordination, decreased attention, decreased awareness, decreased problem solving, decreased safety awareness and decreased memory and decreased standing balance, decreased postural control and decreased balance strategies and therefore will continue to benefit from skilled PT intervention to increase functional independence with mobility.  Patient progressing toward long term goals..  Plan of care revisions: Up graded bed mobility, transfer, and gait goals due to patient progress and prolonged stay.  PT Short Term Goals Week 2:  PT Short Term Goal 1 (Week 2): STG=LTG due to ELOS. PT Short Term Goal 1 - Progress (Week 2): Progressing toward goal Week 3:  PT Short Term Goal 1 (Week 3): STG=LTG due to ELOS.  Therapy Documentation Precautions:  Precautions Precautions: Fall Precaution Comments: impulsive, L lateral lean Restrictions Weight Bearing Restrictions: No   Therapy/Group: Individual Therapy  Merril Nagy L Kynnadi Dicenso PT, DPT  09/27/2019, 7:53 AM

## 2019-09-27 NOTE — Plan of Care (Signed)
°  Problem: Sit to Stand Goal: LTG:  Patient will perform sit to stand with assistance level (PT) Description: LTG:  Patient will perform sit to stand with assistance level (PT) Flowsheets (Taken 09/27/2019 0800) LTG: PT will perform sit to stand in preparation for functional mobility with assistance level: (Upgraded goal due to patient's progress with mobility, improved congnition, and prolonged stay.) Independent with assistive device Note: Upgraded goal due to patient's progress with mobility, improved congnition, and prolonged stay.   Problem: RH Bed Mobility Goal: LTG Patient will perform bed mobility with assist (PT) Description: LTG: Patient will perform bed mobility with assistance, with/without cues (PT). Flowsheets (Taken 09/27/2019 0800) LTG: Pt will perform bed mobility with assistance level of: (Upgraded goal due to patient's progress with mobility, improved congnition, and prolonged stay.) Independent Note: Upgraded goal due to patient's progress with mobility, improved congnition, and prolonged stay.   Problem: RH Bed to Chair Transfers Goal: LTG Patient will perform bed/chair transfers w/assist (PT) Description: LTG: Patient will perform bed to chair transfers with assistance (PT). Flowsheets (Taken 09/27/2019 0800) LTG: Pt will perform Bed to Chair Transfers with assistance level: (Upgraded goal due to patient's progress with mobility, improved congnition, and prolonged stay.) Independent with assistive device  Note: Upgraded goal due to patient's progress with mobility, improved congnition, and prolonged stay.   Problem: RH Wheelchair Mobility Goal: LTG Patient will propel w/c in controlled environment (PT) Description: LTG: Patient will propel wheelchair in controlled environment, # of feet with assist (PT) Outcome: Not Applicable Flowsheets (Taken 09/27/2019 0800) LTG: Pt will propel w/c in controlled environ  assist needed:: (d/c goal due to patient progress to functional  ambulator.) -- Note: d/c goal due to patient progress to functional ambulator. Goal: LTG Patient will propel w/c in home environment (PT) Description: LTG: Patient will propel wheelchair in home environment, # of feet with assistance (PT). Outcome: Not Applicable Flowsheets (Taken 09/27/2019 0800) LTG: Pt will propel w/c in home environ  assist needed:: (d/c goal due to patient progress to functional ambulator.) -- Note: d/c goal due to patient progress to functional ambulator.

## 2019-09-27 NOTE — Plan of Care (Signed)
  Problem: Consults Goal: RH BRAIN INJURY PATIENT EDUCATION Description: Description: See Patient Education module for eduction specifics Outcome: Progressing Goal: Skin Care Protocol Initiated - if Braden Score 18 or less Description: If consults are not indicated, leave blank or document N/A Outcome: Progressing   Problem: RH BOWEL ELIMINATION Goal: RH STG MANAGE BOWEL WITH ASSISTANCE Description: STG Manage Bowel with mod I Assistance. Outcome: Progressing   Problem: RH BLADDER ELIMINATION Goal: RH STG MANAGE BLADDER WITH ASSISTANCE Description: STG Manage Bladder With mod I Assistance Outcome: Progressing Goal: RH STG MANAGE BLADDER WITH MEDICATION WITH ASSISTANCE Description: STG Manage Bladder With Medication With mod I Assistance. Outcome: Progressing   Problem: RH SKIN INTEGRITY Goal: RH STG SKIN FREE OF INFECTION/BREAKDOWN Description: Pt will be free of skin breakdown prior to DC with min assist  Outcome: Progressing Goal: RH STG MAINTAIN SKIN INTEGRITY WITH ASSISTANCE Description: STG Maintain Skin Integrity With min Assistance. Outcome: Progressing   Problem: RH SAFETY Goal: RH STG ADHERE TO SAFETY PRECAUTIONS W/ASSISTANCE/DEVICE Description: STG Adhere to Safety Precautions With cues/reminders Assistance/Device. Outcome: Progressing   Problem: RH KNOWLEDGE DEFICIT BRAIN INJURY Goal: RH STG INCREASE KNOWLEDGE OF SELF CARE AFTER BRAIN INJURY Description: Pt will be able to demonstrate knowledge of fall prevention, safety precautions, medication management, and disease process with mod I assist using handouts/booklets prior to DC Outcome: Progressing

## 2019-09-28 ENCOUNTER — Inpatient Hospital Stay (HOSPITAL_COMMUNITY): Payer: Medicare Other | Admitting: Speech Pathology

## 2019-09-28 ENCOUNTER — Inpatient Hospital Stay (HOSPITAL_COMMUNITY): Payer: Medicare Other | Admitting: Occupational Therapy

## 2019-09-28 ENCOUNTER — Inpatient Hospital Stay (HOSPITAL_COMMUNITY): Payer: Medicare Other | Admitting: Physical Therapy

## 2019-09-28 ENCOUNTER — Encounter (HOSPITAL_COMMUNITY): Payer: Medicare Other | Admitting: Psychology

## 2019-09-28 DIAGNOSIS — R410 Disorientation, unspecified: Secondary | ICD-10-CM

## 2019-09-28 NOTE — Progress Notes (Signed)
Patient ID: Haley Mayo, female   DOB: 1950-04-24, 69 y.o.   MRN: [id number]  Sw received phone call from pt son Virgel Gess [phone]) who wanted to follow-up about neurosurgeon. SW informed waiting on follow-up. He states he will be flying into [place] on Tuesday evening. SW encouraged him to come to hospital on Wednesday to go with pt through therapies if possible. SW informed there will be follow-up once aware if the referral is accepted.   Sw followed up with [facility] ([phone]/[phone]) and was informed they did not receive fax. SW faxed referral again, and to new fax [phone]. Sw waiting on follow-up.   Loralee Pacas, MSW, [place] Office: [phone] Cell: [phone] Fax: [phone]

## 2019-09-28 NOTE — Progress Notes (Signed)
Physical Therapy Session Note  Patient Details  Name: Haley Mayo MRN: [id number] Date of Birth: [birth year]  Today's Date: 09/28/2019 PT Individual Time: 6015-6153 PT Individual Time Calculation (min): 45 min   Short Term Goals: Week 3:  PT Short Term Goal 1 (Week 3): STG=LTG due to ELOS.  Skilled Therapeutic Interventions/Progress Updates: Pt presents sitting in w/c and agreeable to therapy.  Pt amb multiple trials w/ RW and Supervision, but max cueing for posture and maintaining position w/in RW.  T-band placed in RW for visual input for positioning w gait, but requires cueing for use.  Pt easily distractible and requires re-direction to task.  Pt transfers sit to stand w/ supervision, but also required verbal cues for hand placement.  Pt returned to room and chair alarm placed w/ all needs in reach.     Therapy Documentation Precautions:  Precautions Precautions: Fall Precaution Comments: impulsive, L lateral lean Restrictions Weight Bearing Restrictions: No General:   Vital Signs: Therapy Vitals Temp: 98.7 F (37.1 C) Temp Source: Oral Pulse Rate: 82 Resp: 16 BP: 128/62 Patient Position (if appropriate): Lying Oxygen Therapy SpO2: 96 % O2 Device: Room Air Pain:  no c/o pain.   Therapy/Group: Individual Therapy  [name] 09/28/2019, 3:02 PM

## 2019-09-28 NOTE — Progress Notes (Signed)
[facility] PHYSICAL MEDICINE & REHABILITATION PROGRESS NOTE   Subjective/Complaints: Mrs. Anfinson was seen by Neuropsych today. Demonstrated paranoia and reported that she felt people were coming in her room and taking her things.  Demonstrated good understanding of her discharge-states that her son will be arriving from [place] on Wednesday night and she expects to be discharged home with him to her house in [place] on Thursday morning.   ROS: Patient denies fever, rash, sore throat, blurred vision, nausea, vomiting, diarrhea, cough, shortness of breath or chest pain, joint or back pain, headache, or mood change.   Objective:   No results found. No results for input(s): WBC, HGB, HCT, PLT in the last 72 hours. No results for input(s): NA, K, CL, CO2, GLUCOSE, BUN, CREATININE, CALCIUM in the last 72 hours.  Intake/Output Summary (Last 24 hours) at 09/28/2019 1137 Last data filed at 09/28/2019 0700 Gross per 24 hour  Intake 120 ml  Output --  Net 120 ml     Physical Exam: Vital Signs Blood pressure (!) 154/85, pulse 69, temperature 98 F (36.7 C), temperature source Oral, resp. rate 18, height 5' (1.524 m), weight 72.1 kg, SpO2 99 %.  Constitutional: No distress . Vital signs reviewed. HEENT: EOMI, oral membranes moist Neck: supple Cardiovascular: RRR without murmur. No JVD    Respiratory/Chest: CTA Bilaterally without wheezes or rales. Normal effort    GI/Abdomen: BS +, non-tender, non-distended Ext: no clubbing, cyanosis, or edema Psych: a little anxious and distracted per baseline.  Skin: intact Musc: No edema in extremities.  No tenderness in extremities. Neurologic: alert,  still can be tangential and demonstrates LOC too  Motor: Bilateral upper extremities: 5/5 proximal distal Bilateral lower extremities: Hip flexion, knee extension-->distal 4+/5  Assessment/Plan: 1. Functional deficits secondary to decreased functional mobility secondary to subarachnoid hemorrhage  right>left status with basilar aneurysm.  which require 3+ hours per day of interdisciplinary therapy in a comprehensive inpatient rehab setting.  Physiatrist is providing close team supervision and 24 hour management of active medical problems listed below.  Physiatrist and rehab team continue to assess barriers to discharge/monitor patient progress toward functional and medical goals  Care Tool:  Bathing    Body parts bathed by patient: Right arm, Left arm, Chest, Abdomen, Front perineal area, Buttocks, Right upper leg, Left upper leg, Face, Left lower leg, Right lower leg   Body parts bathed by helper: Right lower leg, Left lower leg     Bathing assist Assist Level: Contact Guard/Touching assist     Upper Body Dressing/Undressing Upper body dressing   What is the patient wearing?: Pull over shirt    Upper body assist Assist Level: Set up assist    Lower Body Dressing/Undressing Lower body dressing      What is the patient wearing?: Pants, Underwear/pull up     Lower body assist Assist for lower body dressing: Contact Guard/Touching assist     Toileting Toileting Toileting Activity did not occur (Clothing management and hygiene only): N/A (no void or bm)  Toileting assist Assist for toileting: Contact Guard/Touching assist Assistive Device Comment: walker   Transfers Chair/bed transfer  Transfers assist     Chair/bed transfer assist level: Contact Guard/Touching assist Chair/bed transfer assistive device: Programmer, multimedia   Ambulation assist      Assist level: Contact Guard/Touching assist Assistive device: Walker-rolling Max distance: 150'   Walk 10 feet activity   Assist     Assist level: Contact Guard/Touching assist Assistive device: Walker-rolling  Walk 50 feet activity   Assist Walk 50 feet with 2 turns activity did not occur: Safety/medical concerns  Assist level: Contact Guard/Touching assist Assistive device:  Walker-rolling    Walk 150 feet activity   Assist Walk 150 feet activity did not occur: Safety/medical concerns  Assist level: Contact Guard/Touching assist Assistive device: Walker-rolling    Walk 10 feet on uneven surface  activity   Assist Walk 10 feet on uneven surfaces activity did not occur: Safety/medical concerns         Wheelchair     Assist Will patient use wheelchair at discharge?: Yes Type of Wheelchair: Manual    Wheelchair assist level: Contact Guard/Touching assist Max wheelchair distance: 100'    Wheelchair 50 feet with 2 turns activity    Assist        Assist Level: Contact Guard/Touching assist   Wheelchair 150 feet activity     Assist  Wheelchair 150 feet activity did not occur: Safety/medical concerns       Blood pressure (!) 154/85, pulse 69, temperature 98 F (36.7 C), temperature source Oral, resp. rate 18, height 5' (1.524 m), weight 72.1 kg, SpO2 99 %.  Medical Problem List and Plan: 1.  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  Status post coil embolization 09/01/2019  Continue CIR PT, OT, SLP  -NS gives patient clearance to fly back to [place] with son   Working toward dc of 10/01/19 to accommodate son's travel back to [place]. 2.  Antithrombotics: -DVT/anticoagulation: Pulmonary embolus.  Status post IVC filter 09/02/2019.  Lower extremity Dopplers negative.  pt is ambulating 150+ ft and is refusing heparin--- will dc heparin             -antiplatelet therapy: Aspirin 325 mg and Plavix 75 mg daily 3. Pain Management: Tylenol as needed.  -observing HA off topamax. Well controlled.  4. Mood: Provide emotional support             -antipsychotic agents: N/A 5. Neuropsych: This patient is not fully capable of making decisions on her own behalf.  -Telesitter for safety 6. Skin/Wound Care: Routine skin checks 7. Fluids/Electrolytes/Nutrition:    Recent  labs 6/7 wnl, recheck tomorrow 8.  Cerebral vasospasm.  Nimotop decreased on 6/5  -use TEDS or Abd binder if needed to support bp  BP labile- continue to monitor.  9.  2.3 cm right thyroid heterogeneous nodule.  Follow-up outpatient 10.  Tobacco abuse.  Counseling 11.  Post stroke Dysphagia.  Dysphagia #3 thins liquids. Follow-up speech therapy 12.  Urinary retention.  still req I/O cath at times but less so  6/11 Increased urecholine to 50 mg tid   Encourage OOB to void  6/14: Voiding on own- patient is very pleased! 13. Stage II sacral decub- improved 14. Leukocytosis:   resolved LOS: 18 days A FACE TO FACE EVALUATION WAS PERFORMED  Clide Deutscher Tonesha Tsou 09/28/2019, 11:37 AM

## 2019-09-28 NOTE — Progress Notes (Signed)
Occupational Therapy Session Note  Patient Details  Name: Haley Mayo MRN: [id number] Date of Birth: 06-28-1950  Today's Date: 09/28/2019 OT Individual Time: 1117-3567 OT Individual Time Calculation (min): 72 min   Short Term Goals: Week 2:  OT Short Term Goal 1 (Week 2): Pt will demonstrate improved safety awanress within BADL tasks, requiring only min verba llcues OT Short Term Goal 2 (Week 2): Pt don TED hose with min A OT Short Term Goal 3 (Week 2): Pt will tolerate stanidng at the sink for 5 mins in prep for BADL Tasks  Skilled Therapeutic Interventions/Progress Updates:    Pt greeted seated in wc and agreeable to OT treatment session. OT provided pt with new clean paper scrubs to put on after showering. Pt needed verbal cues to lock wc brakes prior to standing. Close supervision for sit<>stand and supervision for ambulating into bathroom w/ RW. Pt turned to sit on commode with verbal cues for proper management of RW and safety. Pt voided bladder and completed 3/3 toileting steps with supervision. Pt needed verbal cues to return to sitting to step out of pants and not stand to step out. Pt then ambulated to the shower with supervision. Bathing completed with overall supervision, but verbal cues for safety as pt trying to stand and prop foot on bench at one point. Educated pt on squatting to reach peri-area safely. Pt ambulated out of shower and completed dressing at EOB. Worked on pt donning TED hose using friction reducing device. Pt brought down to tub room and educated on tub bench transfer. Discussed waiting to order her tub bench off of [employer] once in [place] so she does not have to take it on the airplane or pay for shipping. Pt returned to room and left seated in wc with alarm belt on and set-up for lunch.    Therapy Documentation Precautions:  Precautions Precautions: Fall Precaution Comments: impulsive, L lateral lean Restrictions Weight Bearing Restrictions:  No Pain: Pain Assessment Pain Scale: 0-10 Pain Score: 0-No pain ADL:     Therapy/Group: Individual Therapy  [name] 09/28/2019, 11:42 AM

## 2019-09-28 NOTE — Consult Note (Addendum)
Neuropsychological Consultation   Patient:   Haley Mayo   DOB:   Oct 09, 1950  MR Number:  [id number]  Location:  [facility] [place] [id number] [place] [address] Dept: [place]: [phone]           Date of Service:   09/28/2019  Start Time:   8 AM End Time:   9 AM  Provider/Observer:  Ilean Skill, Psy.D.       Clinical Neuropsychologist       Billing Code/Service: [phone]  Chief Complaint:    Haley Mayo is a 69 year old right-handed female with a history of hypertension, hyperlipidemia and tobacco abuse.  Patient originally presented to [facility] on 08/26/2019 after being found down for an extended time.  The patient was admitted initially describing a sudden onset of weakness and lethargy to where she had to lie down on the floor and not been able to get up for fear of falling.  Initial work-up revealed bilateral pneumonia and she was started on antibiotic therapy.  On 5/16 the patient developed fever and complained of blurry vision and persistent headache.  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  CT angiogram of chest showed a filling deficit within the proximal right upper lobe consistent with pulmonary embolus.  Patient was transferred to [facility] on 08/30/2019 for ongoing care.  Underwent diagnostic cerebral angiogram stent support coil embolization of basilar aneurysm 09/01/2019 per Dr. Kathyrn Sheriff.  Underwent IVC filter placement 09/02/2019.  Patient with significant confusion and orientation issues for some time.  Patient continues with some degree of confusion about events that  happened during her hospital course and some confusion about what happened with her initial presentation.  Reason for Service:  The patient was referred for neuropsychological consultation due to cognitive changes and confusion following a subarachnoid hemorrhage.  Below see HPI for the current admission.  HPI: Haley Mayo is a 69 year old right-handed female with history of hypertension as well as hyperlipidemia and tobacco abuse.  Per chart review lives alone independent prior to admission.  1 level home 4 steps to entry.  She has 2 children 1 lives in [place] 1 lives in [place].Son is to provide assist on discharge.  Presented 08/26/2019 to [facility] after being found down for an extended time.  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  CT angiogram of the chest showed a filling defect within the proximal right upper lobe apical and posterior segmental arteries consistent with pulmonary embolus.  Moderate cardiomegaly.  There was also a 2.3 cm right thyroid heterogeneous nodule.  Venous Doppler studies lower extremity showed no DVT.  Admission chemistries showed a WBC of 27,100, hemoglobin 15.4, CK 3162, lactic acid 1.2, glucose 166, BUN 28, creatinine 0.81, troponin 23, SARS coronavirus negative.  Chest x-ray showed mild basilar atelectasis and/or infiltrate.  Placed on  broad-spectrum antibiotics.  She was transferred to [facility] 08/30/2019 for ongoing follow-up and care.  Underwent diagnostic cerebral angiogram stent support coil embolization of basilar aneurysm 09/01/2019 per Dr. Kathyrn Sheriff.  EEG completed showing no seizure.  She underwent IVC filter placement 09/02/2019 per interventional  radiology for pulmonary emboli.  She did remain on low-dose subcutaneous heparin for DVT prophylaxis.  Aspirin and Plavix later initiated after embolization.  Blood pressure controlled with Nimotop.  Her diet was slowly advanced to a dysphagia #3 thin liquid diet with alternative means of nutrition for nutritional support.  There was some documentation on urinary retention placed on low-dose Urecholine.  Therapy evaluations completed and patient was admitted for a comprehensive rehab program.  Current Status:  Initially, the patient was having some difficulty orienting when I first came in the room and showed some level of confusion.  The patient stated that she felt like her personal belongings were being taken from her while in the room and that people were coming into the room and going through her stuff.  The patient had very little personal items in the room.  As the conversation went on the patient appeared to increase in improving her clarity.  She did report that her attention and mathematical abilities were below what she was used to.  The patient's expressive language and immediate learning all appeared to be intact.  The patient showed good receptive language.  The patient was moving all limbs.  The patient showed clear awareness of when her son was arriving in [place] from [place] which showed some lack of clarity regarding what will happen once she is discharged.  The patient is aware of her discharge coming up and plans for her son to be here.  She still has a great deal of confusion around what happened to her as far as the medical event and aneurysm and what happened during the surgeries and resulting changes.  Behavioral Observation: Haley Mayo  presents as a 69 y.o.-year-old Right handed Pol[place] descent/[place] Female who appeared her stated age. her dress was Appropriate and she was Well Groomed and her manners were Appropriate to the situation.  her participation was  indicative of Inattentive and Redirectable behaviors.  There were any physical disabilities noted.  she displayed an appropriate level of cooperation and motivation.     Interactions:    Active Inattentive and Redirectable  Attention:   abnormal and attention span appeared shorter than expected for age  Memory:   abnormal; remote memory intact, recent memory impaired  Visuo-spatial:  not examined  Speech (Volume):  normal  Speech:   normal; normal  Thought Process:  Tangential and Disorganized  Though Content:  WNL; not suicidal and not homicidal  Orientation:   person, place and time/date  Judgment:   Fair  Planning:   Poor  Affect:    Excited  Mood:    Euthymic  Insight:   Fair  Intelligence:   high  Medical History:   Past Medical History:  Diagnosis Date  . HLD (hyperlipidemia)   . HTN (hypertension)    controlled  . Leukocytosis    ?   Marland Kitchen Neoplasm of ear    left ear lobe  . Obesity   . Wears dentures    upper   Psychiatric History:  No prior psychiatric history  Family Med/Psych History:  Family History  Problem Relation Age of Onset  . Ulcers Father   . Pneumonia Mother   . Diabetes Sister   .  Stroke Brother   . Lupus Sister   . Stroke Sister        hemorrhage after fall  . Lupus Sister   . Rheum arthritis Sister        ?  . Diabetes Sister   . Other Sister        peritoneal dialysis   Impression/DX:  Haley Mayo is a 69 year old right-handed female with a history of hypertension, hyperlipidemia and tobacco abuse.  Patient originally presented to [facility] on 08/26/2019 after being found down for an extended time.  The patient was admitted initially describing a sudden onset of weakness and lethargy to where she had to lie down on the floor and not been able to get up for fear of falling.  Initial work-up revealed bilateral pneumonia and she was started on antibiotic therapy.  On 5/16 the patient developed fever and  complained of blurry vision and persistent headache.  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  CT angiogram of chest showed a filling deficit within the proximal right upper lobe consistent with pulmonary embolus.  Patient was transferred to [facility] on 08/30/2019 for ongoing care.  Underwent diagnostic cerebral angiogram stent support coil embolization of basilar aneurysm 09/01/2019 per Dr. Kathyrn Sheriff.  Underwent IVC filter placement 09/02/2019.  Patient with significant confusion and orientation issues for some time.  Patient continues with some degree of confusion about events that happened during her hospital course and some confusion about what happened with her initial presentation.  Initially, the patient was having some difficulty orienting when I first came in the room and showed some level of confusion.  The patient stated that she felt like her personal belongings were being taken from her while in the room and that people were coming into the room and going through her stuff.  The patient had very little personal items in the room.  His conversation went on the patient appeared to increase in improving her clarity.  She did report that her attention and mathematical abilities were below what she was used to.  The patient's expressive language and immediate learning all appeared to be intact.  The patient showed good receptive language.  The patient was moving all limbs.  The patient showed clear awareness of when her son was arriving in [place] from [place] which showed some lack of clarity regarding what will happen once she is discharged.  The patient is aware of her discharge coming up and plans for her son to be here.  She  still has a great deal of confusion around what happened to her as far as the medical event and aneurysm and what happened during the surgeries and resulting changes.  Disposition/Plan:  The patient has improved significantly with regard to her orientation and reduction in confusion.  The patient continues to have a great deal of confusion about recent past events but is more clear about what is going on day-to-day currently.  The patient stated some concerns about people coming in her room but most of that had to do with days prior and are likely directly related to her residual effects of her subarachnoid hemorrhage.  These concerns are likely related to direct  residual effects of her subarachnoid hemorrhage and metabolic state.  The patient will be discharged in roughly 2 days and her son is coming from [place] to take care of her and then potentially planning on her going back with him to [place].  Diagnosis:    SAH         Electronically Signed   _______________________ Ilean Skill, Psy.D.

## 2019-09-28 NOTE — Plan of Care (Signed)
°  Problem: Consults Goal: RH BRAIN INJURY PATIENT EDUCATION Description: Description: See Patient Education module for eduction specifics Outcome: Progressing Goal: Skin Care Protocol Initiated - if Braden Score 18 or less Description: If consults are not indicated, leave blank or document N/A Outcome: Progressing   Problem: RH BOWEL ELIMINATION Goal: RH STG MANAGE BOWEL WITH ASSISTANCE Description: STG Manage Bowel with mod I Assistance. Outcome: Progressing   Problem: RH BLADDER ELIMINATION Goal: RH STG MANAGE BLADDER WITH ASSISTANCE Description: STG Manage Bladder With mod I Assistance Outcome: Progressing Goal: RH STG MANAGE BLADDER WITH MEDICATION WITH ASSISTANCE Description: STG Manage Bladder With Medication With mod I Assistance. Outcome: Progressing   Problem: RH SKIN INTEGRITY Goal: RH STG SKIN FREE OF INFECTION/BREAKDOWN Description: Pt will be free of skin breakdown prior to DC with min assist  Outcome: Progressing Goal: RH STG MAINTAIN SKIN INTEGRITY WITH ASSISTANCE Description: STG Maintain Skin Integrity With min Assistance. Outcome: Progressing   Problem: RH SAFETY Goal: RH STG ADHERE TO SAFETY PRECAUTIONS W/ASSISTANCE/DEVICE Description: STG Adhere to Safety Precautions With cues/reminders Assistance/Device. Outcome: Progressing   Problem: RH KNOWLEDGE DEFICIT BRAIN INJURY Goal: RH STG INCREASE KNOWLEDGE OF SELF CARE AFTER BRAIN INJURY Description: Pt will be able to demonstrate knowledge of fall prevention, safety precautions, medication management, and disease process with mod I assist using handouts/booklets prior to DC Outcome: Progressing

## 2019-09-28 NOTE — Progress Notes (Signed)
Speech Language Pathology Daily Session Note  Patient Details  Name: Haley Mayo MRN: [id number] Date of Birth: 1950/10/19  Today's Date: 09/28/2019 SLP Individual Time: 0915-1010 SLP Individual Time Calculation (min): 55 min  Short Term Goals: Week 3: SLP Short Term Goal 1 (Week 3): STGS=LTGs due to ELOS  Skilled Therapeutic Interventions: Skilled treatment session focused on cognitive goals. Upon arrival, patient perseverative on finding personal items and feels they have been taken by staff (ongoing confabulation). With extra time and Max verbal cues, patient was able to be redirected. SLP facilitated session by re-administering the Cognistat in which patient scored WFL on all subtests and independently utilized memory compensatory strategies to help facilitate recall throughout. Although patient scored WFL, mild deficits remain in memory which SLP feels will be somewhat reduced when son is present and can help reduce anxiety regarding personal items. Patient left upright in wheelchair with alarm on and all needs within reach. Continue with current plan of care.       Pain No/Denies Pain   Therapy/Group: Individual Therapy  Ranetta Armacost 09/28/2019, 12:17 PM

## 2019-09-29 ENCOUNTER — Inpatient Hospital Stay (HOSPITAL_COMMUNITY): Payer: Medicare Other

## 2019-09-29 ENCOUNTER — Inpatient Hospital Stay (HOSPITAL_COMMUNITY): Payer: Medicare Other | Admitting: Occupational Therapy

## 2019-09-29 ENCOUNTER — Inpatient Hospital Stay (HOSPITAL_COMMUNITY): Payer: Medicare Other | Admitting: Speech Pathology

## 2019-09-29 MED ORDER — METHYLPHENIDATE HCL 5 MG PO TABS
5.0000 mg | ORAL_TABLET | Freq: Two times a day (BID) | ORAL | Status: DC
  Administered 2019-09-29: 5 mg via ORAL
  Filled 2019-09-29 (×4): qty 1

## 2019-09-29 NOTE — Patient Care Conference (Signed)
Inpatient RehabilitationTeam Conference and Plan of Care Update Date: 09/29/2019   Time: 2:24 PM    Patient Name: Haley Mayo      Medical Record Number: [id number]  Date of Birth: 1951/03/16 Sex: Female         Room/Bed: 4W20C/4W20C-01 Payor Info: Payor: MEDICARE / Plan: MEDICARE PART A AND B / Product Type: *No Product type* /    Admit Date/Time:  09/10/2019  4:18 PM  Primary Diagnosis:  SAH (subarachnoid hemorrhage) ([place])  Patient Active Problem List   Diagnosis Date Noted  . Subacute confusional state   . Cerebral vasospasm   . Hypotension due to drugs   . Leukocytosis   . Urinary retention   . Dysphagia, post-stroke   . Vascular headache   . SAH (subarachnoid hemorrhage) ([place]) 09/10/2019  . Pressure injury of skin 09/05/2019  . Subarachnoid hemorrhage ([place]) 08/30/2019  . CAP (community acquired pneumonia) 08/27/2019  . Community acquired pneumonia 08/26/2019  . Elevated CK 08/26/2019  . Generalized weakness 08/26/2019  . Healthcare maintenance 02/02/2011  . Smoking 02/02/2011  . 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 Hyperlipemia 01/27/2010  . LEUKOCYTOSIS 01/27/2010  . Essential hypertension, benign 10/04/2009    Expected Discharge Date: Expected Discharge Date: 10/01/19  Team Members Present: Physician leading conference: Dr. Alger Simons Care Coodinator Present: Loralee Pacas, LCSWA;Other (comment) Dorthula Nettles, RN, BSN, CRRN) Nurse Present: Mohammed Kindle, RN PT Present: Apolinar Junes, PT OT Present: Cherylynn Ridges, OT SLP Present: Weston Anna, SLP PPS Coordinator present : Ileana Ladd, PT     Current Status/Progress Goal Weekly Team Focus  Bowel/Bladder   Continent mostly, can have incontient moments. No I/O's needed the past few days.  To be more continent.  Assess tolieting q2 hrs   Swallow/Nutrition/ Hydration   Regular textures with thin liquids, Mod I  Mod I  Goal Met   ADL's   Supervision, min verbal cues  supervision  dc planning,  pt/family education, self-care retraining, activty tolerance   Mobility   Supervision overall, gait 250 ft with RW, stairs CGA B rails  Mod I bed mobility and transfers, supervision gait, CGA stairs  Transfer training, balance, gait training, attention, safety awareness, d/c planning, patient/caregiver education   Communication             Safety/Cognition/ Behavioral Observations  Min-Mod A  Min A  family Education   Pain   No complaints of pain.  To remain pain free.  Assess pain q shift or prn.   Skin   Skin is intact.  To prevent breakdown from occurring.  Assess skin q shift or prn.      *See Care Plan and progress notes for long and short-term goals.     Barriers to Discharge  Current Status/Progress Possible Resolutions Date Resolved   Nursing                  PT  Decreased caregiver support;Lack of/limited family support;Behavior  Patient's son is only available caregiver and lives in [place], patient requires 24/7 supervision due to cognitive deficits impacting safety  Patient's son to fly in from [place] and patient to return with him for 24/7 assist at d/c for safety.           OT                  SLP Decreased caregiver support;Lack of/limited family support              Care Coordinator  Discharge Planning/Teaching Needs:   Pt will need PCP in [place], [place] as well as a Garment/textile technologist. Son is to provide information and SW has made referrals. Waiting on confirmation she could be accepted as new patient. Family education as recommended by therapy.    Team Discussion:  Cues for safety awareness. Confusion improving. Pt using RW, still needs cues d/t easily distractible. Perseverates on missing items. Family education to begin with son when he arrives from [place].    Revisions to Treatment Plan:  none    Medical Summary Current Status: slowly improving cognition, still anxious/paranoid. bladder now emptying. pain, h/as improved Weekly Focus/Goal: continence of  bowel and bladder.  Barriers to Discharge: Behavior   Possible Resolutions to Barriers: med modifications, cognitive-behavioral mgt   Continued Need for Acute Rehabilitation Level of Care: The patient requires daily medical management by a physician with specialized training in physical medicine and rehabilitation for the following reasons: Direction of a multidisciplinary physical rehabilitation program to maximize functional independence : Yes Medical management of patient stability for increased activity during participation in an intensive rehabilitation regime.: Yes Analysis of laboratory values and/or radiology reports with any subsequent need for medication adjustment and/or medical intervention. : Yes   I attest that I was present, lead the team conference, and concur with the assessment and plan of the team.   Cristi Loron 09/29/2019, 2:24 PM

## 2019-09-29 NOTE — Progress Notes (Signed)
Occupational Therapy Session Note  Patient Details  Name: Haley Mayo MRN: [id number] Date of Birth: 12/25/1950  Today's Date: 09/29/2019 OT Individual Time: 8453-6468 OT Individual Time Calculation (min): 60 min    Short Term Goals: Week 2:  OT Short Term Goal 1 (Week 2): Pt will demonstrate improved safety awanress within BADL tasks, requiring only min verba llcues OT Short Term Goal 2 (Week 2): Pt don TED hose with min A OT Short Term Goal 3 (Week 2): Pt will tolerate stanidng at the sink for 5 mins in prep for BADL Tasks  Skilled Therapeutic Interventions/Progress Updates:    Pt greeted seated in wc finishing breakfast and agreeable to OT treatment session. Pt reported she had a good night last night and ready for therapy. Pt declined any BADL needs and agreeable to head to the gym. Pt ambulated to therapy gym with RW, supervision, and intermittent verbal cues for proximity to RW. Worked on dynamic standing balance with small wedge to work on anterior weight shift. Had pt reaching outside base of support and behind for increased balance challenge using cards. After working on wedge, OT removed wedge and worked on balance without RW and reaching outside base of support. Worked on fine motor control with peg board actviity focused on in-hand manipulation, transaltion, and rotation of pegs. Pt ambulated back to room in similar fashion with verbal cues for RW positioning when turning to sit back in wc.  Pt left seated in wc with alarm belt on, call bell in reach, and needs met.   Therapy Documentation Precautions:  Precautions Precautions: Fall Precaution Comments: impulsive, L lateral lean Restrictions Weight Bearing Restrictions: No Pain:  denies pain   Therapy/Group: Individual Therapy  [name] 09/29/2019, 8:13 AM

## 2019-09-29 NOTE — Progress Notes (Signed)
Speech Language Pathology Daily Session Note  Patient Details  Name: JAZZMON PRINDLE MRN: [id number] Date of Birth: [birth year]  Today's Date: 09/29/2019 SLP Individual Time: 7207-2182 SLP Individual Time Calculation (min): 55 min  Short Term Goals: Week 3: SLP Short Term Goal 1 (Week 3): STGS=LTGs due to ELOS  Skilled Therapeutic Interventions: Skilled treatment session focused on cognitive goals. SLP facilitated session by providing education regarding memory compensatory strategies and how to implement strategies at home. Patient verbalized understanding of all information but required overall Mod A verbal cues for anticipatory awareness in regards to need for 24 hour supervision and generating a list of activities she can complete safely at home. Patient left upright in the wheelchair with alarm on and all needs within reach. Continue with current plan of care.      Pain No/Denies Pain   Therapy/Group: Individual Therapy  Shilo Pauwels 09/29/2019, 10:14 AM

## 2019-09-29 NOTE — Progress Notes (Addendum)
Patient ID: Haley Mayo, female   DOB: 02-10-1951, 69 y.o.   MRN: [id number]  SW spoke with [facility] (p:[phone]/[phone]) toi discuss referral. Reports referral was received, and waiting on follow-up from physician to see if able to accept. Reports if pt is accepted, the earliest appointment is mid July or August.  *SW made efforts to call pt son Haley Mayo but no answer.   SW received call from [facility] medical group who stated Dr. Alphia Kava was unable to accept pt; and referral will be given to Dr. Serita Grit. No answer until Thursday since he is out of the office.   Loralee Pacas, MSW, [place] Office: [phone] Cell: [phone] Fax: [phone]

## 2019-09-29 NOTE — Progress Notes (Signed)
[place] PHYSICAL MEDICINE & REHABILITATION PROGRESS NOTE   Subjective/Complaints: Up in w/c. About ready to go to therapy. Still anxious.   ROS: Limited due to cognitive/behavioral     Objective:   No results found. No results for input(s): WBC, HGB, HCT, PLT in the last 72 hours. No results for input(s): NA, K, CL, CO2, GLUCOSE, BUN, CREATININE, CALCIUM in the last 72 hours.  Intake/Output Summary (Last 24 hours) at 09/29/2019 1027 Last data filed at 09/29/2019 0900 Gross per 24 hour  Intake 600 ml  Output 0 ml  Net 600 ml     Physical Exam: Vital Signs Blood pressure (!) 148/74, pulse 77, temperature 98.8 F (37.1 C), temperature source Oral, resp. rate 14, height 5' (1.524 m), weight 68.4 kg, SpO2 98 %.  Constitutional: No distress . Vital signs reviewed. HEENT: EOMI, oral membranes moist Neck: supple Cardiovascular: RRR without murmur. No JVD    Respiratory/Chest: CTA Bilaterally without wheezes or rales. Normal effort    GI/Abdomen: BS +, non-tender, non-distended Ext: no clubbing, cyanosis, or edema Psych: pleasant and cooperative  Skin: intact Musc: No edema in extremities.  No tenderness in extremities. Neurologic: alert,  still can be tangential and demonstrates LOC too  Motor: Bilateral upper extremities: 5/5 proximal distal Bilateral lower extremities: Hip flexion, knee extension-->distal 4+/5  Assessment/Plan: 1. 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  which require 3+ hours per day of interdisciplinary therapy in a comprehensive inpatient rehab setting.  Physiatrist is providing close team supervision and 24 hour management of active medical problems listed below.  Physiatrist and rehab team continue to assess barriers to discharge/monitor patient progress toward functional and medical goals  Care Tool:  Bathing    Body parts bathed by patient:  Right arm, Left arm, Chest, Abdomen, Front perineal area, Buttocks, Right upper leg, Left upper leg, Face, Left lower leg, Right lower leg   Body parts bathed by helper: Right lower leg, Left lower leg     Bathing assist Assist Level: Contact Guard/Touching assist     Upper Body Dressing/Undressing Upper body dressing   What is the patient wearing?: Pull over shirt    Upper body assist Assist Level: Set up assist    Lower Body Dressing/Undressing Lower body dressing      What is the patient wearing?: Pants, Underwear/pull up     Lower body assist Assist for lower body dressing: Contact Guard/Touching assist     Toileting Toileting Toileting Activity did not occur (Clothing management and hygiene only): N/A (no void or bm)  Toileting assist Assist for toileting: Contact Guard/Touching assist Assistive Device Comment: walker   Transfers Chair/bed transfer  Transfers assist     Chair/bed transfer assist level: Contact Guard/Touching assist Chair/bed transfer assistive device: Programmer, multimedia   Ambulation assist      Assist level: Supervision/Verbal cueing Assistive device: Walker-rolling Max distance: 175   Walk 10 feet activity   Assist     Assist level: Supervision/Verbal cueing Assistive device: Walker-rolling   Walk 50 feet activity   Assist Walk 50 feet with 2 turns activity did not occur: Safety/medical concerns  Assist level: Supervision/Verbal cueing Assistive device: Walker-rolling    Walk 150 feet activity   Assist Walk 150 feet activity did not occur: Safety/medical concerns  Assist level: Supervision/Verbal cueing Assistive device: Walker-rolling    Walk 10 feet on uneven surface  activity   Assist Walk 10 feet on  uneven surfaces activity did not occur: Safety/medical concerns         Wheelchair     Assist Will patient use wheelchair at discharge?: Yes Type of Wheelchair: Manual    Wheelchair  assist level: Contact Guard/Touching assist Max wheelchair distance: 100'    Wheelchair 50 feet with 2 turns activity    Assist        Assist Level: Contact Guard/Touching assist   Wheelchair 150 feet activity     Assist  Wheelchair 150 feet activity did not occur: Safety/medical concerns       Blood pressure (!) 148/74, pulse 77, temperature 98.8 F (37.1 C), temperature source Oral, resp. rate 14, height 5' (1.524 m), weight 68.4 kg, SpO2 98 %.  Medical Problem List and Plan: 1.  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  Status post coil embolization 09/01/2019  Continue CIR PT, OT, SLP  -NS gives patient clearance to fly back to [place] with son   Working toward [employer] of 10/01/19 to accommodate son's travel back to [facility].  SW working on [employer] in [place].  2.  Antithrombotics: -DVT/anticoagulation: Pulmonary embolus.  Status post IVC filter 09/02/2019.  Lower extremity Dopplers negative.  pt is ambulating 150+ ft and is refusing heparin--- will dc heparin             -antiplatelet therapy: Aspirin 325 mg and Plavix 75 mg daily 3. Pain Management: Tylenol as needed.  -observing HA off topamax. Well controlled off med.  4. Mood: appreciate neuropsych assessment             -antipsychotic agents: N/A  -still anxious and paranoid/perseverative at times but can be redirected 5. Neuropsych: This patient is not fully capable of making decisions on her own behalf.  -Telesitter for safety ongoing  -will add low dose ritalin to see if improves pt's attention and restlessness 6. Skin/Wound Care: Routine skin checks 7. Fluids/Electrolytes/Nutrition:    Recent labs 6/7 wn  8.  Cerebral vasospasm.  Nimotop decreased on 6/5  -use TEDS or Abd binder if needed to support bp  BP improving.  9.  2.3 cm right thyroid heterogeneous nodule.  Follow-up outpatient 10.  Tobacco abuse.  Counseling 11.  Post stroke  Dysphagia.  Dysphagia #3 thins liquids. Follow-up speech therapy 12.  Urinary retention.  still req I/O cath at times but less so  6/11 Increased urecholine to 50 mg tid   Encourage OOB to void  6/14-15 voiding appears much improved! 13. Stage II sacral decub- improved 14. Leukocytosis:   resolved LOS: 19 days A FACE TO [place] 09/29/2019, 10:27 AM

## 2019-09-29 NOTE — Progress Notes (Signed)
Physical Therapy Session Note  Patient Details  Name: ABBIE Mayo MRN: [id number] Date of Birth: 03/13/1951  Today's Date: 09/29/2019 PT Individual Time:  1100-1200 PT Individual Time Calculation: 60 min  Short Term Goals: Week 2:  PT Short Term Goal 1 (Week 2): STG=LTG due to ELOS. PT Short Term Goal 1 - Progress (Week 2): Progressing toward goal Week 3:  PT Short Term Goal 1 (Week 3): STG=LTG due to ELOS. Week 4:     Skilled Therapeutic Interventions/Progress Updates:     Patient in w/c in room upon PT arrival. Patient alert and agreeable to PT session. Patient denied pain during session. Patient tangential and perseverative on personal items and hospital course of stay throughout session. Overall, redirectable to attending to gait tasks and education, but would return to these topics throughout. Focused session on outdoor/community mobility, patient education, and d/c planning.    Therapeutic Activity: Transfers: Patient performed sit to/from stand x3 from the w/c, a toilet transfer x1 with use of grab bar, and from a bench x1 using a RW with supervision. Provided verbal cues for reaching back to sit and locking breaks before standing from w/c for safety.  Gait Training:  Patient ambulated 15 feet to/from bathroom and >250 ft x2 over unlevel surfaces (sidewalk and asphalt) outside using RW with supervision. Ambulated with decreased gait speed, decreased step length and height, and forward trunk lean with increased hip flexion. Provided verbal cues for staying close to the RW for safety and increased step height, particularly on unlevel surfaces, to prevent toe drag/tripping.  Wheelchair Mobility:  Patient was transported in the w/c with total A from her room to/from the courtyard outside the [facility] during session for energy conservation and time management.  Discussed d/c planning, states her son is taking time off and could stay here for a short time if patient needs to  follow-up with the neurologist before going to [place]. Also discussed safety during the flight to [place] including strategies to avoid prolonged sitting, taking multiple flights with layovers, standing/walking on the flight, use of w/c transport through the airport for energy conservation, and wearing compression stockings to prevent LE swelling and OH symptoms. Patient receptive to education and stated understanding, will reinforce education during family education with patient's son prior to d/c.  Patient in w/c in the room with RN at end of session with breaks locked, seat belt alarm set, and all needs within reach. Patient with questions regarding medications, redirected questions to RN at end of session.    Therapy Documentation Precautions:  Precautions Precautions: Fall Precaution Comments: impulsive, L lateral lean Restrictions Weight Bearing Restrictions: No    Therapy/Group: Individual Therapy  Haley Mayo PT, DPT  09/29/2019, 7:01 PM

## 2019-09-30 ENCOUNTER — Encounter (HOSPITAL_COMMUNITY): Payer: Medicare Other | Admitting: Occupational Therapy

## 2019-09-30 ENCOUNTER — Ambulatory Visit (HOSPITAL_COMMUNITY): Payer: Medicare Other

## 2019-09-30 ENCOUNTER — Encounter (HOSPITAL_COMMUNITY): Payer: Medicare Other | Admitting: Speech Pathology

## 2019-09-30 NOTE — Plan of Care (Signed)
  Problem: RH Balance Goal: LTG Patient will maintain dynamic standing with ADLs (OT) Description: LTG:  Patient will maintain dynamic standing balance with assist during activities of daily living (OT)  Outcome: Completed/Met   Problem: RH Eating Goal: LTG Patient will perform eating w/assist, cues/equip (OT) Description: LTG: Patient will perform eating with assist, with/without cues using equipment (OT) Outcome: Completed/Met   Problem: RH Grooming Goal: LTG Patient will perform grooming w/assist,cues/equip (OT) Description: LTG: Patient will perform grooming with assist, with/without cues using equipment (OT) Outcome: Completed/Met   Problem: RH Bathing Goal: LTG Patient will bathe all body parts with assist levels (OT) Description: LTG: Patient will bathe all body parts with assist levels (OT) Outcome: Completed/Met   Problem: RH Dressing Goal: LTG Patient will perform upper body dressing (OT) Description: LTG Patient will perform upper body dressing with assist, with/without cues (OT). Outcome: Completed/Met Goal: LTG Patient will perform lower body dressing w/assist (OT) Description: LTG: Patient will perform lower body dressing with assist, with/without cues in positioning using equipment (OT) Outcome: Completed/Met   Problem: RH Toileting Goal: LTG Patient will perform toileting task (3/3 steps) with assistance level (OT) Description: LTG: Patient will perform toileting task (3/3 steps) with assistance level (OT)  Outcome: Completed/Met   Problem: RH Functional Use of Upper Extremity Goal: LTG Patient will use RT/LT upper extremity as a (OT) Description: LTG: Patient will use right/left upper extremity as a stabilizer/gross assist/diminished/nondominant/dominant level with assist, with/without cues during functional activity (OT) Outcome: Completed/Met   Problem: RH Toilet Transfers Goal: LTG Patient will perform toilet transfers w/assist (OT) Description: LTG:  Patient will perform toilet transfers with assist, with/without cues using equipment (OT) Outcome: Completed/Met   Problem: RH Tub/Shower Transfers Goal: LTG Patient will perform tub/shower transfers w/assist (OT) Description: LTG: Patient will perform tub/shower transfers with assist, with/without cues using equipment (OT) Outcome: Completed/Met   Problem: RH Memory Goal: LTG Patient will demonstrate ability for day to day recall/carry over during activities of daily living with assistance level (OT) Description: LTG:  Patient will demonstrate ability for day to day recall/carry over during activities of daily living with assistance level (OT). Outcome: Completed/Met   Problem: RH Attention Goal: LTG Patient will demonstrate this level of attention during functional activites (OT) Description: LTG:  Patient will demonstrate this level of attention during functional activites  (OT) Outcome: Completed/Met   Problem: RH Awareness Goal: LTG: Patient will demonstrate awareness during functional activites type of (OT) Description: LTG: Patient will demonstrate awareness during functional activites type of (OT) Outcome: Completed/Met

## 2019-09-30 NOTE — Progress Notes (Signed)
Occupational Therapy Session Note  Patient Details  Name: Haley Mayo MRN: [id number] Date of Birth: Oct 11, 1950  Today's Date: 09/30/2019 OT Individual Time: 1505-1600 OT Individual Time Calculation (min): 55 min    Short Term Goals: Week 2:  OT Short Term Goal 1 (Week 2): Pt will demonstrate improved safety awanress within BADL tasks, requiring only min verba llcues OT Short Term Goal 2 (Week 2): Pt don TED hose with min A OT Short Term Goal 3 (Week 2): Pt will tolerate stanidng at the sink for 5 mins in prep for BADL Tasks  Skilled Therapeutic Interventions/Progress Updates:     Upon entering the room, pt seated in wheelchair with no c/o pain and pt's son present during this session. OT reviewed pt's goals and recommendation for 24/7 supervisionPlan with education on implications of what could happen if they are not followed. Pt and caregiver with several questions regarding discharge as location is currently undecided based on not able to get provider at this time where son lives. Son does report having FMLA he could take to stay in town to assist his mother. OT made it clear that she may still need supervision after that time has passed and he needed to plan for that as well. Pt ambulating with RW 150' to tub room with supervision and OT having to cue caregiver to attend/supervise pt with mobility task for safety multiple times during this session. OT demonstrated transfer onto TTB with pt returning demonstration with supervision but then refusing bench for home. OT providing extensive education to pt and caregiver on recommendation to decrease fall risk and eliminate stepping over tub ledge. Pt wanted to use a wooden chair for bathing at home. OT educated pt on why this was unsafe. OT printing picture of recommended equipment and providing to pt and caregiver. OT assisted pt into wheelchair with chair alarm belt activated. Caregiver report no further questions at this time.   Therapy  Documentation Precautions:  Precautions Precautions: Fall Precaution Comments: impulsive, L lateral lean Restrictions Weight Bearing Restrictions: No General:   Vital Signs: Therapy Vitals Temp: 98.7 F (37.1 C) Temp Source: Oral Pulse Rate: 77 Resp: 18 BP: 132/62 Patient Position (if appropriate): Sitting Oxygen Therapy SpO2: 97 % O2 Device: Room Air   Therapy/Group: Individual Therapy  Gypsy Decant 09/30/2019, 5:00 PM

## 2019-09-30 NOTE — Progress Notes (Signed)
Speech Language Pathology Discharge Summary  Patient Details  Name: Haley Mayo MRN: [id number] Date of Birth: [birth year]  Today's Date: 09/30/2019 SLP Individual Time: 1430-1500 SLP Individual Time Calculation (min): 30 min   Skilled Therapeutic Interventions:  Skilled treatment session focused on completion of family education the patient and her son. Both were educated regarding patient's current cognitive functioning and need for 24 hour supervision for utilization of strategies to maximize recall, attention, problem solving, safety and overall independence. Both verbalized understanding of all information and handouts were given to reinforce information.    Patient has met 7 of 7 long term goals.  Patient to discharge at overall Mod;Min level.   Reasons goals not met: N/A   Clinical Impression/Discharge Summary: Patient has made functional gains and has met 7 of 7 LTGs this admission. Currently, patient is consuming regular textures with thin liquids without overt s/s of aspiration and is overall Mod I for use of swallowing compensatory strategies. Patient also requires overall Min-Mod A verbal cues to complete functional and semi-complex tasks safely in regards to sustained attention, functional problem solving, recall with use of compensatory strategies and awareness. Patient and family education is complete and patient will discharge home with 24 hour supervision from family. Patient would benefit from f/u SLP services to maximize her cognitive functioning and overall functional independence in order to reduce caregiver burden.   Care Partner:  Caregiver Able to Provide Assistance: Yes  Type of Caregiver Assistance: Physical;Cognitive  Recommendation:  24 hour supervision/assistance;Home Health SLP  Rationale for SLP Follow Up: Maximize cognitive function and independence;Reduce caregiver burden   Equipment: N/A   Reasons for discharge: Discharged from hospital;Treatment  goals met   Patient/Family Agrees with Progress Made and Goals Achieved: Yes    [name] 09/30/2019, 6:38 AM

## 2019-09-30 NOTE — Progress Notes (Signed)
Patient ID: Haley Mayo, female   DOB: 01/12/1951, 69 y.o.   MRN: [id number]  SW me with pt and pt son to provide updates on challenges with getting neurosurgeon in [place]. SW informed will not have more updates until Thursday, however, attending was working on other options.  SW informed will also need to establish PCP for home health services in area and pt will not have services until she is seen by PCP in [place]. Pt son states he understands.   SW ordered DME: RW with [employer] via parachute.    Loralee Pacas, MSW, [place] Office: [phone] Cell: [phone] Fax: [phone]

## 2019-09-30 NOTE — Progress Notes (Signed)
[place] PHYSICAL MEDICINE & REHABILITATION PROGRESS NOTE   Subjective/Complaints: Up eating breakfast. Says she hasn't heard from son. Fixated on lost items in room including phone  ROS: Limited due to cognitive/behavioral     Objective:   No results found. No results for input(s): WBC, HGB, HCT, PLT in the last 72 hours. No results for input(s): NA, K, CL, CO2, GLUCOSE, BUN, CREATININE, CALCIUM in the last 72 hours.  Intake/Output Summary (Last 24 hours) at 09/30/2019 0806 Last data filed at 09/30/2019 0710 Gross per 24 hour  Intake 780 ml  Output --  Net 780 ml     Physical Exam: Vital Signs Blood pressure (!) 143/77, pulse 87, temperature 98.9 F (37.2 C), temperature source Oral, resp. rate 16, height 5' (1.524 m), weight 71.1 kg, SpO2 98 %.  Constitutional: No distress . Vital signs reviewed. HEENT: EOMI, oral membranes moist Neck: supple Cardiovascular: RRR without murmur. No JVD    Respiratory/Chest: CTA Bilaterally without wheezes or rales. Normal effort    GI/Abdomen: BS +, non-tender, non-distended Ext: no clubbing, cyanosis, or edema Psych: perseverative and paranoid at times.  Skin: intact Musc: No edema in extremities.  No tenderness in extremities. Neurologic: alert,  Perseverative but can be redirected. At times answers appropriately Motor: Bilateral upper extremities: 5/5 proximal distal Bilateral lower extremities: Hip flexion, knee extension-->distal 4+  Assessment/Plan: 1. 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  which require 3+ hours per day of interdisciplinary therapy in a comprehensive inpatient rehab setting.  Physiatrist is providing close team supervision and 24 hour management of active medical problems listed below.  Physiatrist and rehab team continue to assess barriers to discharge/monitor patient progress toward functional and medical  goals  Care Tool:  Bathing    Body parts bathed by patient: Right arm, Left arm, Chest, Abdomen, Front perineal area, Buttocks, Right upper leg, Left upper leg, Face, Left lower leg, Right lower leg   Body parts bathed by helper: Right lower leg, Left lower leg     Bathing assist Assist Level: Contact Guard/Touching assist     Upper Body Dressing/Undressing Upper body dressing   What is the patient wearing?: Pull over shirt    Upper body assist Assist Level: Set up assist    Lower Body Dressing/Undressing Lower body dressing      What is the patient wearing?: Pants, Underwear/pull up     Lower body assist Assist for lower body dressing: Contact Guard/Touching assist     Toileting Toileting Toileting Activity did not occur (Clothing management and hygiene only): N/A (no void or bm)  Toileting assist Assist for toileting: Contact Guard/Touching assist Assistive Device Comment: walker   Transfers Chair/bed transfer  Transfers assist     Chair/bed transfer assist level: Supervision/Verbal cueing Chair/bed transfer assistive device: Programmer, multimedia   Ambulation assist      Assist level: Supervision/Verbal cueing Assistive device: Walker-rolling Max distance: >250 ft   Walk 10 feet activity   Assist     Assist level: Supervision/Verbal cueing Assistive device: Walker-rolling   Walk 50 feet activity   Assist Walk 50 feet with 2 turns activity did not occur: Safety/medical concerns  Assist level: Supervision/Verbal cueing Assistive device: Walker-rolling    Walk 150 feet activity   Assist Walk 150 feet activity did not occur: Safety/medical concerns  Assist level: Supervision/Verbal cueing Assistive device: Walker-rolling    Walk 10 feet on uneven surface  activity  Assist Walk 10 feet on uneven surfaces activity did not occur: Safety/medical concerns   Assist level: Supervision/Verbal cueing Assistive device:  Aeronautical engineer Will patient use wheelchair at discharge?: Yes Type of Wheelchair: Manual    Wheelchair assist level: Contact Guard/Touching assist Max wheelchair distance: 100'    Wheelchair 50 feet with 2 turns activity    Assist        Assist Level: Contact Guard/Touching assist   Wheelchair 150 feet activity     Assist  Wheelchair 150 feet activity did not occur: Safety/medical concerns       Blood pressure (!) 143/77, pulse 87, temperature 98.9 F (37.2 C), temperature source Oral, resp. rate 16, height 5' (1.524 m), weight 71.1 kg, SpO2 98 %.  Medical Problem List and Plan: 1.  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  Status post coil embolization 09/01/2019  Continue CIR PT, OT, SLP  -NS gives patient clearance to fly back to [place] with son   -Working toward [employer] of 10/01/19 to accommodate son's travel back to [facility].  Awaiting his arrival.  -spoke to NS yesterday who can see patient here in [place] before she goes to [place]. They can see from 1-3 weeks post discharge depending upon patient's needs.  2.  Antithrombotics: -DVT/anticoagulation: Pulmonary embolus.  Status post IVC filter 09/02/2019.  Lower extremity Dopplers negative.  pt is ambulating 150+ ft and is refusing heparin--- will dc heparin             -antiplatelet therapy: Aspirin 325 mg and Plavix 75 mg daily 3. Pain Management: Tylenol as needed.  -observing HA off topamax. Well controlled off med.  4. Mood: appreciate neuropsych assessment             -antipsychotic agents: N/A  -still anxious and paranoid/perseverative at times but can be redirected 5. Neuropsych: This patient is not fully capable of making decisions on her own behalf.  -Telesitter for safety ongoing  -continue low dose ritalin to see if improves pt's attention 6. Skin/Wound Care: Routine skin checks 7. Fluids/Electrolytes/Nutrition:     Recent labs 6/7 wn  8.  Cerebral vasospasm.  Nimotop decreased on 6/5  -use TEDS or Abd binder if needed to support bp  BP improving.  9.  2.3 cm right thyroid heterogeneous nodule.  Follow-up outpatient 10.  Tobacco abuse.  Counseling 11.  Post stroke Dysphagia.  Dysphagia #3 thins liquids. Follow-up speech therapy 12.  Urinary retention.  still req I/O cath at times but less so  6/11 Increased urecholine to 50 mg tid   Encourage OOB to void  -voiding appears much improved! 13. Stage II sacral decub- improved 14. Leukocytosis:   resolved LOS: 20 days A FACE TO [place] 09/30/2019, 8:06 AM

## 2019-09-30 NOTE — Progress Notes (Signed)
Occupational Therapy Discharge Summary  Patient Details  Name: Haley Mayo MRN: [id number] Date of Birth: [birth year]  Patient has met 18 of 75 long term goals due to improved activity tolerance, improved balance, postural control, ability to compensate for deficits, improved attention, improved awareness and improved coordination.  Patient to discharge at overall Supervision level.  Patient's care partner is independent to provide the necessary physical and cognitive assistance at discharge.    Reasons goals not met: n/a  Recommendation:  Patient will benefit from ongoing skilled OT services in home health setting to continue to advance functional skills in the area of BADL and Reduce care partner burden.  Equipment: RW, tub transfer bench (pt to order privately)  Reasons for discharge: treatment goals met and discharge from hospital  Patient/family agrees with progress made and goals achieved: Yes  OT Discharge Precautions/Restrictions  Precautions Precautions: Fall Restrictions Weight Bearing Restrictions: No General   Vital Signs Therapy Vitals Pulse Rate: 76 Resp: 16 BP: 120/70 Patient Position (if appropriate): Sitting Oxygen Therapy SpO2: 98 % O2 Device: Room Air Pain Pain Assessment Pain Scale: 0-10 Pain Score: 0-No pain ADL ADL Eating: Independent Grooming: Independent Upper Body Bathing: Supervision/safety Lower Body Bathing: Supervision/safety Upper Body Dressing: Independent Lower Body Dressing: Supervision/safety Toileting: Supervision/safety Toilet Transfer: Close supervision Tub/Shower Transfer: Close supervison Social research officer, government: Close supervision Vision Baseline Vision/History: Wears glasses Wears Glasses: At all times Patient Visual Report: No change from baseline Vision Assessment?: Yes Eye Alignment: Within Functional Limits Alignment/Gaze Preference: Within Defined Limits Tracking/Visual Pursuits: Requires cues, head turns,  or add eye shifts to track Saccades: Additional head turns occurred during testing Depth Perception: Undershoots Perception  Perception: Impaired Spatial Orientation: reports objects are closer than they are, states that it takes time to figure out where she is in space when first standing Praxis Praxis: Impaired Praxis Impairment Details: Perseveration Praxis-Other Comments: improved since admission, currently intermittent Cognition Overall Cognitive Status: Impaired/Different from baseline Arousal/Alertness: Awake/alert Orientation Level: Oriented to person;Oriented to place;Oriented to time;Oriented to situation Attention: Sustained Focused Attention: Appears intact Sustained Attention: Impaired Sustained Attention Impairment: Functional complex;Verbal complex Selective Attention: Impaired Selective Attention Impairment: Verbal complex;Functional complex Memory: Impaired Memory Impairment: Decreased recall of new information;Decreased short term memory Decreased Short Term Memory: Verbal complex;Functional complex Awareness: Impaired Awareness Impairment: Emergent impairment Problem Solving: Impaired Problem Solving Impairment: Verbal complex;Functional complex Behaviors: Perseveration Safety/Judgment: Impaired Sensation Sensation Light Touch: Appears Intact Proprioception: Impaired by gross assessment Coordination Gross Motor Movements are Fluid and Coordinated: No Fine Motor Movements are Fluid and Coordinated: Yes Coordination and Movement Description: improved smoothness and accuracy Motor  Motor Motor: Motor apraxia Mobility  Bed Mobility Bed Mobility: Rolling Right;Rolling Left;Supine to Sit;Sit to Supine Rolling Right: Independent Rolling Left: Independent Supine to Sit: Independent Sit to Supine: Independent Transfers Sit to Stand: Supervision/Verbal cueing Stand to Sit: Supervision/Verbal cueing  Trunk/Postural Assessment  Thoracic Assessment Thoracic  Assessment: Exceptions to [facility] (forward head and shoulders with fatigue) Lumbar Assessment Lumbar Assessment: Exceptions to [facility] (flexed posture with fatigue) Postural Control Postural Control: Deficits on evaluation  Balance Static Sitting Balance Static Sitting - Balance Support: No upper extremity supported;Feet supported Static Sitting - Level of Assistance: 7: Independent Dynamic Sitting Balance Dynamic Sitting - Balance Support: No upper extremity supported;Feet supported Dynamic Sitting - Level of Assistance: 7: Independent Dynamic Sitting - Balance Activities: Lateral lean/weight shifting;Forward lean/weight shifting;Reaching for objects Static Standing Balance Static Standing - Balance Support: No upper extremity supported;During functional activity Static Standing - Level of  Assistance: 5: Stand by assistance Dynamic Standing Balance Dynamic Standing - Balance Support: During functional activity Dynamic Standing - Level of Assistance: 5: Stand by assistance Dynamic Standing - Balance Activities: Lateral lean/weight shifting;Forward lean/weight shifting;Reaching for objects Extremity/Trunk Assessment RUE Assessment RUE Assessment: Within Functional Limits LUE Assessment LUE Assessment: Within Functional Limits   Daneen Schick Tyeisha Dinan 09/30/2019, 8:46 PM

## 2019-09-30 NOTE — Plan of Care (Signed)
  Problem: Consults Goal: RH BRAIN INJURY PATIENT EDUCATION Description: Description: See Patient Education module for eduction specifics Outcome: Progressing Goal: Skin Care Protocol Initiated - if Braden Score 18 or less Description: If consults are not indicated, leave blank or document N/A Outcome: Progressing   Problem: RH BOWEL ELIMINATION Goal: RH STG MANAGE BOWEL WITH ASSISTANCE Description: STG Manage Bowel with mod I Assistance. Outcome: Progressing   Problem: RH BLADDER ELIMINATION Goal: RH STG MANAGE BLADDER WITH ASSISTANCE Description: STG Manage Bladder With mod I Assistance Outcome: Progressing Goal: RH STG MANAGE BLADDER WITH MEDICATION WITH ASSISTANCE Description: STG Manage Bladder With Medication With mod I Assistance. Outcome: Progressing   Problem: RH SKIN INTEGRITY Goal: RH STG SKIN FREE OF INFECTION/BREAKDOWN Description: Pt will be free of skin breakdown prior to DC with min assist  Outcome: Progressing Goal: RH STG MAINTAIN SKIN INTEGRITY WITH ASSISTANCE Description: STG Maintain Skin Integrity With min Assistance. Outcome: Progressing   Problem: RH SAFETY Goal: RH STG ADHERE TO SAFETY PRECAUTIONS W/ASSISTANCE/DEVICE Description: STG Adhere to Safety Precautions With cues/reminders Assistance/Device. Outcome: Progressing   Problem: RH KNOWLEDGE DEFICIT BRAIN INJURY Goal: RH STG INCREASE KNOWLEDGE OF SELF CARE AFTER BRAIN INJURY Description: Pt will be able to demonstrate knowledge of fall prevention, safety precautions, medication management, and disease process with mod I assist using handouts/booklets prior to DC Outcome: Progressing

## 2019-09-30 NOTE — Plan of Care (Signed)
  Problem: RH Balance Goal: LTG Patient will maintain dynamic sitting balance (PT) Description: LTG:  Patient will maintain dynamic sitting balance with assistance during mobility activities (PT) Outcome: Completed/Met Goal: LTG Patient will maintain dynamic standing balance (PT) Description: LTG:  Patient will maintain dynamic standing balance with assistance during mobility activities (PT) Outcome: Completed/Met   Problem: Sit to Stand Goal: LTG:  Patient will perform sit to stand with assistance level (PT) Description: LTG:  Patient will perform sit to stand with assistance level (PT) Outcome: Adequate for Discharge   Problem: RH Bed Mobility Goal: LTG Patient will perform bed mobility with assist (PT) Description: LTG: Patient will perform bed mobility with assistance, with/without cues (PT). Outcome: Completed/Met   Problem: RH Bed to Chair Transfers Goal: LTG Patient will perform bed/chair transfers w/assist (PT) Description: LTG: Patient will perform bed to chair transfers with assistance (PT). Outcome: Adequate for Discharge   Problem: RH Car Transfers Goal: LTG Patient will perform car transfers with assist (PT) Description: LTG: Patient will perform car transfers with assistance (PT). Outcome: Completed/Met   Problem: RH Furniture Transfers Goal: LTG Patient will perform furniture transfers w/assist (OT/PT) Description: LTG: Patient will perform furniture transfers  with assistance (OT/PT). Outcome: Completed/Met   Problem: RH Ambulation Goal: LTG Patient will ambulate in controlled environment (PT) Description: LTG: Patient will ambulate in a controlled environment, # of feet with assistance (PT). Outcome: Completed/Met Goal: LTG Patient will ambulate in home environment (PT) Description: LTG: Patient will ambulate in home environment, # of feet with assistance (PT). Outcome: Completed/Met   Problem: RH Stairs Goal: LTG Patient will ambulate up and down stairs  w/assist (PT) Description: LTG: Patient will ambulate up and down # of stairs with assistance (PT) Outcome: Completed/Met

## 2019-09-30 NOTE — Progress Notes (Signed)
Physical Therapy Discharge Summary  Patient Details  Name: Haley Mayo MRN: [id number] Date of Birth: 1950/06/19  Today's Date: 09/30/2019 PT Individual Time: 1334-1430 PT Individual Time Calculation (min): 56 min    Patient has met 8 of 10 long term goals due to improved activity tolerance, improved balance, improved postural control, increased strength, ability to compensate for deficits, improved attention, improved awareness and improved coordination.  Patient to discharge at an ambulatory level Supervision.   Patient's care partner is independent to provide the necessary physical and cognitive assistance at discharge.  Reasons goals not met: Patient continues to require cues for safety with transfers at supervision level. Patient's son able to provide this level of assistance at d/c, demonstrated during hands-on family training.   Recommendation:  Patient will benefit from ongoing skilled PT services in home health setting to continue to advance safe functional mobility, address ongoing impairments in balance, strength, attention, safety awareness, functional mobility, gait, activity tolerance, patient/caregiver education, and minimize fall risk.  Equipment: RW  Reasons for discharge: treatment goals met  Patient/family agrees with progress made and goals achieved: Yes  Skilled Therapeutic Intervention: Patient in w/c with her son in the room upon PT arrival. Patient alert and agreeable to PT session. Patient denied pain during session. Patient's son participated in family education and hands-on training throughout session.   Therapeutic Activity: Bed Mobility: Patient performed rolling R/L and supine to/from sit independently in the ADL bed.  Transfers: Patient performed sit to/from stand x7 with supervision using a RW. Provided verbal cues for hand placement and reaching back to sit for safe use of RW. Required cues each trial due to decreased recall of new information.   Patient performed a simulated sedan height car transfer with supervision using RW. Provided cues for safe technique, use of sit and pivot technique versus step-in technique for safety.  Gait Training:  Patient ambulated >250 feet, ~100 feet, and ~150 feet using RW with supervision for safety. Ambulated as described below. Provided verbal cues for staying close to RW for safety, erect posture, and increased step height for safety. Patient ambulated up/down a ramp, over 10 feet of mulch (unlevel surface), and up/down a curb to simulate community ambulation over unlevel surfaces with close supervision using RW. Provided cues for technique and use of AD. Patient ascended/descended 8 steps using B rails with supervision. Performed step-to gait pattern interchanging leading foot throughout. Provided cues for technique and sequencing and safe guarding technique for patient's son. PT demonstrated management of RW and safe guarding technique during first 4 steps and patient's son performed technique with patient during last 4 steps.    Educated patient and her son on patient's progress, continued cognitive deficits affecting patient's safety awareness, judgement, and memory. Recommending patient have 24/7 supervision at this time. Patient's son able to provide this for ~1 month if staying in [place] and longer with additional assistance if they return to [place]. Patient's son states that he does not notice any change in is mother, except use of RW. Described safety concerns with mobility, ADLs, and IADLs at this time and impact of a fall, injury, or poor management of medications. Also educated on fall risk/prevention, home modifications to prevent falls, and activation of emergency services in the event of a fall during session.   Patient in w/c with her son in the room when handed off to SLP for continued family education at end of session.   PT Discharge Precautions/Restrictions Precautions Precautions:  Fall Restrictions Weight Bearing  Restrictions: No Vision/Perception  Vision - Assessment Eye Alignment: Within Functional Limits Alignment/Gaze Preference: Within Defined Limits Tracking/Visual Pursuits: Requires cues, head turns, or add eye shifts to track Saccades: Additional head turns occurred during testing Perception Perception: Impaired Spatial Orientation: reports objects are closer than they are, states that it takes time to figure out where she is in space when first standing Praxis Praxis: Impaired Praxis Impairment Details: Perseveration Praxis-Other Comments: improved since admission, currently intermittent  Cognition Overall Cognitive Status: Impaired/Different from baseline Arousal/Alertness: Awake/alert Attention: Sustained Focused Attention: Appears intact Sustained Attention: Impaired Sustained Attention Impairment: Functional complex;Verbal complex Selective Attention: Impaired Selective Attention Impairment: Verbal complex;Functional complex Memory: Impaired Memory Impairment: Decreased recall of new information;Decreased short term memory Decreased Short Term Memory: Verbal complex;Functional complex Awareness: Impaired Awareness Impairment: Emergent impairment Problem Solving: Impaired Problem Solving Impairment: Verbal complex;Functional complex Behaviors: Perseveration Safety/Judgment: Impaired Sensation Sensation Light Touch: Appears Intact Proprioception: Impaired by gross assessment Coordination Gross Motor Movements are Fluid and Coordinated: No Coordination and Movement Description: generalized deconditioning, decreased spacial awareness, and decreased balance with mobility Motor  Motor Motor: Motor apraxia  Mobility Bed Mobility Bed Mobility: Rolling Right;Rolling Left;Supine to Sit;Sit to Supine Rolling Right: Independent Rolling Left: Independent Supine to Sit: Independent Sit to Supine: Independent Transfers Sit to Stand:  Supervision/Verbal cueing Stand to Sit: Supervision/Verbal cueing Stand Pivot Transfers: Supervision/Verbal cueing Transfer (Assistive device): Rolling walker Locomotion  Gait Ambulation: Yes Gait Assistance: Supervision/Verbal cueing Gait Distance (Feet): 250 Feet Assistive device: Rolling walker Gait Assistance Details: Verbal cues for safe use of DME/AE;Verbal cues for technique Gait Gait: Yes Gait Pattern: Decreased stride length;Step-through pattern;Decreased hip/knee flexion - right;Decreased hip/knee flexion - left;Right foot flat;Left foot flat;Decreased trunk rotation;Trunk flexed;Narrow base of support Gait velocity: decreased Stairs / Additional Locomotion Stairs: Yes Stairs Assistance: Supervision/Verbal cueing Stair Management Technique: Two rails Number of Stairs: 8 Height of Stairs: 6 Ramp: Supervision/Verbal cueing Curb: Supervision/Verbal cueing Wheelchair Mobility 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, petroleum)  Trunk/Postural Assessment  Thoracic Assessment Thoracic Assessment: Exceptions to [facility] (forward head and shoulders with fatigue) Lumbar Assessment Lumbar Assessment: Exceptions to WFL (flexed posture with fatigue) Postural Control Postural Control: Deficits on evaluation  Balance Static Sitting Balance Static Sitting - Balance Support: No upper extremity supported;Feet supported Static Sitting - Level of Assistance: 7: Independent Dynamic Sitting Balance Dynamic Sitting - Balance Support: No upper extremity supported;Feet supported Dynamic Sitting - Level of Assistance: 7: Independent Dynamic Sitting - Balance Activities: Lateral lean/weight shifting;Forward lean/weight shifting;Reaching for objects Static Standing Balance Static Standing - Balance Support: No upper extremity supported;During functional activity Static Standing - Level of Assistance: 5: Stand by assistance Dynamic Standing Balance Dynamic Standing - Balance Support:  During functional activity;Right upper extremity supported;Left upper extremity supported Dynamic Standing - Level of Assistance: 5: Stand by assistance Dynamic Standing - Balance Activities: Lateral lean/weight shifting;Forward lean/weight shifting;Reaching for objects Extremity Assessment  RLE Assessment RLE Assessment: Within Functional Limits Active Range of Motion (AROM) Comments: WFL General Strength Comments: grossly 4+/5 throughout in sitting LLE Assessment LLE Assessment: Within Functional Limits Active Range of Motion (AROM) Comments: WFL General Strength Comments: Grossly at least 4+/5 throughout in sitting    Jaicey Sweaney L Santiel Topper PT, DPT  09/30/2019, 5:19 PM

## 2019-10-01 MED ORDER — BETHANECHOL CHLORIDE 50 MG PO TABS: 50.0000 mg | ORAL_TABLET | Freq: Three times a day (TID) | ORAL | 0 refills | Status: AC

## 2019-10-01 MED ORDER — POLYETHYLENE GLYCOL 3350 17 G PO PACK: 17.0000 g | PACK | Freq: Every day | ORAL | 0 refills | Status: AC

## 2019-10-01 MED ORDER — CLOPIDOGREL BISULFATE 75 MG PO TABS: 75.0000 mg | ORAL_TABLET | Freq: Every day | ORAL | 0 refills | Status: AC

## 2019-10-01 MED ORDER — ASPIRIN 325 MG PO TABS: 325.0000 mg | ORAL_TABLET | Freq: Every day | ORAL | 0 refills | Status: AC

## 2019-10-01 MED ORDER — LOPERAMIDE HCL 2 MG PO CAPS: 2.0000 mg | ORAL_CAPSULE | ORAL | 0 refills | Status: AC | PRN

## 2019-10-01 MED FILL — BETHANECHOL 25 MG TABLET: 25 | 30 days supply | Qty: 180 | Fill #0

## 2019-10-01 MED FILL — ASPIRIN EC 325 MG TABLET: 325 | 100 days supply | Qty: 100 | Fill #0

## 2019-10-01 MED FILL — CLOPIDOGREL 75 MG TABLET: 75 | 30 days supply | Qty: 30 | Fill #0

## 2019-10-01 MED FILL — LOPERAMIDE 2 MG CAPSULE: 2 | 30 days supply | Qty: 30 | Fill #0

## 2019-10-01 NOTE — Progress Notes (Addendum)
Patient ID: Haley Mayo, female   DOB: 1950-08-28, 69 y.o.   MRN: [id number]  [place] [phone]) to discuss referral. SW informed that Dr. Serita Grit was still on vacation and when he returns [facility] will follow-up once the referral has been reviewed.   SW provided updates to pt and pt son. Pt and pt son aware SW will not have [place] until pt is seen by primary care in [place]. SW received FMLA forms for pt son. SW informed forms will be completed in which they will be faxed to employer, and he will receive original copy in the mail and SW will email once completed by physician. Son reports pt has a new pt appointment on August 2nd with Alcide Goodness at [facility] [phone]).  Loralee Pacas, MSW, [place] Office: [phone] Cell: [phone] Fax: [phone]

## 2019-10-01 NOTE — Plan of Care (Signed)
Pt discharging with family with personal belongings. P. Love PA-C providing discharge instructions to family and pt. Pt able to verbalize name of medications and why taking medication.

## 2019-10-01 NOTE — Discharge Instructions (Signed)
Inpatient Rehab Discharge Instructions  Haley Mayo Discharge date and time: 10/01/19   Activities/Precautions/ Functional Status: Activity: activity as tolerated Diet: regular diet Wound Care: none needed   Functional status:  ___ No restrictions     ___ Walk up steps independently _X__ 24/7 supervision/assistance   ___ Walk up steps with assistance ___ Intermittent supervision/assistance  ___ Bathe/dress independently ___ Walk with walker     _x__ Bathe/dress with assistance ___ Walk Independently    ___ Shower independently ___ Walk with assistance    ___ Shower with assistance _X_ No alcohol     ___ Return to work/school ________   Special Instructions: 1.No driving or strenuous activity.  2. Ask Dr. Kathyrn Sheriff about referral to neurosurgery in [place] as well as timing of airplane trip.  My questions have been answered and I understand these instructions. I will adhere to these goals and the provided educational materials after my discharge from the hospital.  Patient/Caregiver Signature _______________________________ Date __________  Clinician Signature _______________________________________ Date __________  Please bring this form and your medication list with you to all your follow-up doctor's appointments.

## 2019-10-01 NOTE — Progress Notes (Signed)
[facility] PHYSICAL MEDICINE & REHABILITATION PROGRESS NOTE   Subjective/Complaints: Pt in bathroom. Son at bedside.   ROS: Patient denies fever, rash, sore throat, blurred vision, nausea, vomiting, diarrhea, cough, shortness of breath or chest pain, joint or back pain, headache     Objective:   No results found. No results for input(s): WBC, HGB, HCT, PLT in the last 72 hours. No results for input(s): NA, K, CL, CO2, GLUCOSE, BUN, CREATININE, CALCIUM in the last 72 hours.  Intake/Output Summary (Last 24 hours) at 10/01/2019 1022 Last data filed at 10/01/2019 0900 Gross per 24 hour  Intake 684 ml  Output --  Net 684 ml     Physical Exam: Vital Signs Blood pressure (!) 158/85, pulse 89, temperature 98.2 F (36.8 C), resp. rate 15, height 5' (1.524 m), weight 71.9 kg, SpO2 100 %.  Constitutional: No distress . Vital signs reviewed. HEENT: EOMI, oral membranes moist Neck: supple Cardiovascular: RRR without murmur. No JVD    Respiratory/Chest: CTA Bilaterally without wheezes or rales. Normal effort    GI/Abdomen: BS +, non-tender, non-distended Ext: no clubbing, cyanosis, or edema Psych: pleasant but anxious.  Skin: intact Musc: No edema in extremities.  No tenderness in extremities. Neurologic: alert, but able to focus thoughts better with me today, still can be perseverative Motor: Bilateral upper extremities: 5/5 proximal distal Bilateral lower extremities: Hip flexion, knee extension-->distal 4+  Assessment/Plan: 1. 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  which require 3+ hours per day of interdisciplinary therapy in a comprehensive inpatient rehab setting.  Physiatrist is providing close team supervision and 24 hour management of active medical problems listed below.  Physiatrist and rehab team continue to assess barriers to discharge/monitor patient progress toward  functional and medical goals  Care Tool:  Bathing    Body parts bathed by patient: Right arm, Left arm, Chest, Abdomen, Front perineal area, Buttocks, Right upper leg, Left upper leg, Face, Left lower leg, Right lower leg   Body parts bathed by helper: Right lower leg, Left lower leg     Bathing assist Assist Level: Supervision/Verbal cueing     Upper Body Dressing/Undressing Upper body dressing   What is the patient wearing?: Pull over shirt    Upper body assist Assist Level: Independent    Lower Body Dressing/Undressing Lower body dressing      What is the patient wearing?: Underwear/pull up, Pants     Lower body assist Assist for lower body dressing: Supervision/Verbal cueing     Toileting Toileting Toileting Activity did not occur (Clothing management and hygiene only): N/A (no void or bm)  Toileting assist Assist for toileting: Supervision/Verbal cueing Assistive Device Comment: walker   Transfers Chair/bed transfer  Transfers assist     Chair/bed transfer assist level: Supervision/Verbal cueing Chair/bed transfer assistive device: Programmer, multimedia   Ambulation assist      Assist level: Supervision/Verbal cueing Assistive device: Walker-rolling Max distance: >250 ft   Walk 10 feet activity   Assist     Assist level: Supervision/Verbal cueing Assistive device: Walker-rolling   Walk 50 feet activity   Assist Walk 50 feet with 2 turns activity did not occur: Safety/medical concerns  Assist level: Supervision/Verbal cueing Assistive device: Walker-rolling    Walk 150 feet activity   Assist Walk 150 feet activity did not occur: Safety/medical concerns  Assist level: Supervision/Verbal cueing Assistive device: Walker-rolling    Walk 10 feet on uneven surface  activity   Assist Walk 10 feet on uneven surfaces activity did not occur: Safety/medical concerns   Assist level: Supervision/Verbal cueing Assistive  device: Aeronautical engineer Will patient use wheelchair at discharge?: No (patient is a functional ambulator) Type of Wheelchair: Manual Wheelchair activity did not occur: N/A  Wheelchair assist level: Contact Guard/Touching assist Max wheelchair distance: 100'    Wheelchair 50 feet with 2 turns activity    Assist    Wheelchair 50 feet with 2 turns activity did not occur: N/A   Assist Level: Contact Guard/Touching assist   Wheelchair 150 feet activity     Assist  Wheelchair 150 feet activity did not occur: N/A       Blood pressure (!) 158/85, pulse 89, temperature 98.2 F (36.8 C), resp. rate 15, height 5' (1.524 m), weight 71.9 kg, SpO2 100 %.  Medical Problem List and Plan: 1.  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  Status post coil embolization 09/01/2019  Continue CIR PT, OT, SLP  -dc home today  -f/u with NS next week  -Patient to see Dr Ranell Patrick in the office for transitional care encounter next week.  2.  Antithrombotics: -DVT/anticoagulation: Pulmonary embolus.  Status post IVC filter 09/02/2019.  Lower extremity Dopplers negative.  pt is ambulating 150+ ft and is refusing heparin--- will dc heparin             -antiplatelet therapy: Aspirin 325 mg and Plavix 75 mg daily 3. Pain Management: Tylenol as needed.  -no h/a off topamax 4. Mood: appreciate neuropsych assessment             -antipsychotic agents: N/A  -still anxious and paranoid/perseverative at times but can be redirected 5. Neuropsych: This patient is not fully capable of making decisions on her own behalf.  -Telesitter for safety ongoing  6/17 -dc ritalin d/t questionable effect 6. Skin/Wound Care: Routine skin checks 7. Fluids/Electrolytes/Nutrition:    Recent labs 6/7 wn  8.  Cerebral vasospasm.  off nimotop  -initial hypotension  6/17: bp trending back up somewhat, can resume  amlodipine/benazepril as outpt depending upon bp's 9.  2.3 cm right thyroid heterogeneous nodule.  Follow-up outpatient 10.  Tobacco abuse.  Counseling 11.  Post stroke Dysphagia.  Dysphagia #3 thins liquids. Follow-up speech therapy 12.  Urinary retention.  still req I/O cath at times but less so  6/11 Increased urecholine to 50 mg tid   Encourage OOB to void  -voiding appears much improved!  6/17: wean urecholine to off over the next 1-2 weeks 13. Stage II sacral decub- improved 14. Leukocytosis:   resolved LOS: 21 days A FACE TO [place] 10/01/2019, 10:22 AM

## 2019-10-02 NOTE — Progress Notes (Signed)
Inpatient Rehabilitation Care Coordinator  Discharge Note  The overall goal for the admission was met for:   Discharge location: Yes. D/c to home in [place] with son. Plans to transition to [facility], [place] after being cleared for travel by neurosurgery here.   Length of Stay: Yes. 20 days.   Discharge activity level: Yes. 24/7 Supervision.  Home/community participation: Yes. Limited.   Services provided included: MD, RD, PT, OT, SLP, RN, CM, TR, Pharmacy, Neuropsych and SW  Financial Services: Medicare  Follow-up services arranged: DME: [employer]: youth RW; will purchase BSC when in [place]  *Waiting on follow-up from [facility] group with regard to accepting pt as new neurosurgery patient. Pt son states pt has new patient appointment with PCP- Guadalupe for August 2nd.  Unable to arrange [facility] services due to pt requiring to be establish with a PCP in [place] first as this PCP will need to sign orders for patient.   Comments (or additional information): contact pt son Durenda Guthrie [phone]  Patient/Family verbalized understanding of follow-up arrangements: Yes  Individual responsible for coordination of the follow-up plan: Pt to have assistance with coordinating care needs at discharge.   Confirmed correct DME delivered: Rana Snare 10/02/2019    Rana Snare

## 2019-10-05 NOTE — Discharge Summary (Signed)
Physician Discharge Summary  Patient ID: Haley Mayo MRN: [id number] DOB/AGE: January 23, 1951 69 y.o.  Admit date: 09/10/2019 Discharge date: 10/01/2019  Discharge Diagnoses:  Principal Problem:   SAH (subarachnoid hemorrhage) (HCC) Active Problems:   Leukocytosis   Urinary retention   Dysphagia, post-stroke   Vascular headache   Cerebral vasospasm   Hypotension due to drugs   Subacute confusional state Pulmonary emboli status post IVC filter Tobacco use  Discharged Condition: Stable  Significant Diagnostic Studies: CT HEAD WO CONTRAST  Result Date: 09/07/2019 CLINICAL DATA:  Rule out hydrocephalus. History of subarachnoid hemorrhage. History of aneurysm coiling EXAM: CT HEAD WITHOUT CONTRAST TECHNIQUE: Contiguous axial images were obtained from the base of the skull through the vertex without intravenous contrast. COMPARISON:  CTA head and neck 08/30/2019 FINDINGS: Brain: Ventricle size stable and not significantly enlarged. Mild subarachnoid hemorrhage over the convexity right greater than left has improved. Intraventricular hemorrhage has resolved. Chronic microvascular ischemic change in the white matter. Chronic infarct right inferior cerebellum. Negative for acute infarct or mass. Vascular: Interval coiling of basilar tip aneurysm. Stenting with the basilar to the posterior cerebral artery bilaterally. Negative for hyperdense vessel Skull: No focal skeletal lesion Sinuses/Orbits: Layering secretions in the sphenoid sinus. Remaining sinuses clear. Negative orbit Other: None IMPRESSION: Resolving subarachnoid hemorrhage.  No hydrocephalus Interval stenting and coiling of basilar tip aneurysm. No new hemorrhage or infarction. Electronically Signed   By: Franchot Gallo M.D.   On: 09/07/2019 12:59   VAS [place] TRANSCRANIAL DOPPLER  Result Date: 09/09/2019  Transcranial Doppler Indications: Subarachnoid hemorrhage. Performing Technologist: June Leap RDMS, RVT  Examination Guidelines: A  complete evaluation includes B-mode imaging, spectral Doppler, color Doppler, and power Doppler as needed of all accessible portions of each vessel. Bilateral testing is considered an integral part of a complete examination. Limited examinations for reoccurring indications may be performed as noted.  +----------+-------------+----------+-----------+-------+ RIGHT TCD Right VM (cm)Depth (cm)PulsatilityComment +----------+-------------+----------+-----------+-------+ Opthalmic     27.00                 1.76            +----------+-------------+----------+-----------+-------+ ICA siphon    41.00                 1.56            +----------+-------------+----------+-----------+-------+  +----------+------------+----------+-----------+-------+ LEFT TCD  Left VM (cm)Depth (cm)PulsatilityComment +----------+------------+----------+-----------+-------+ Opthalmic    26.00                 1.83            +----------+------------+----------+-----------+-------+ ICA siphon   36.00                 1.23            +----------+------------+----------+-----------+-------+  Summary:  Suboptimal study due to poor bitemporal and occipital windows bilaterally. Antegrade flow in both opthalmic and carotid siphons *See table(s) above for TCD measurements and observations.  Diagnosing physician: Antony Contras MD Electronically signed by Antony Contras MD on 09/09/2019 at 2:48:00 PM.    Final    VAS [place] TRANSCRANIAL DOPPLER  Result Date: 09/07/2019  Transcranial Doppler Indications: Subarachnoid hemorrhage. Limitations: patient positioning, patient movement Limitations for diagnostic windows: Unable to insonate right transtemporal window. Unable to insonate left transtemporal window. Comparison Study: 09/04/2019 Performing Technologist: Oliver Hum RVT  Examination Guidelines: A complete evaluation includes B-mode imaging, spectral Doppler, color Doppler, and power Doppler as needed of all accessible  portions of each vessel.  Bilateral testing is considered an integral part of a complete examination. Limited examinations for reoccurring indications may be performed as noted.  +----------+-------------+----------+-----------+-------+ RIGHT TCD Right VM (cm)Depth (cm)PulsatilityComment +----------+-------------+----------+-----------+-------+ Opthalmic     13.00                 1.13            +----------+-------------+----------+-----------+-------+ ICA siphon    21.00                 1.94            +----------+-------------+----------+-----------+-------+  +----------+------------+----------+-----------+-------+ LEFT TCD  Left VM (cm)Depth (cm)PulsatilityComment +----------+------------+----------+-----------+-------+ Opthalmic    16.00                 1.98            +----------+------------+----------+-----------+-------+ ICA siphon   27.00                 1.17            +----------+------------+----------+-----------+-------+  Summary:  Absent bitemporal and suboccipital window greatly lomits this eaxam. Antegrade flow noted in both opthalmics and carotid siphons only. *See table(s) above for TCD measurements and observations.  Diagnosing physician: Antony Contras MD Electronically signed by Antony Contras MD on 09/07/2019 at 1:42:49 PM.    Final     Labs:  Basic Metabolic Panel: No results for input(s): NA, K, CL, CO2, GLUCOSE, BUN, CREATININE, CALCIUM, MG, PHOS in the last 168 hours.  CBC: No results for input(s): WBC, NEUTROABS, HGB, HCT, MCV, PLT in the last 168 hours.  CBG: No results for input(s): GLUCAP in the last 168 hours.  Family history.  Father with peptic ulcer disease Sister with lupus as well as CVA and rheumatoid arthritis and diabetes mellitus.  Denies any colon cancer or esophageal cancer or rectal cancer  Brief HPI:   Haley Mayo is a 69 y.o. right-handed female with history of hypertension as well as hyperlipidemia and tobacco use.   Lives alone independent prior to admission.  She has 2 children 1 lives in [place] 1 lives in [place].  Son to assist on discharge.  Presented 08/26/2019 to [facility] after being found down for an extended time.  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  CT angiogram of the chest showed a filling defect within the proximal right upper lobe and apical and posterior segmental arteries consistent with pulmonary embolus.  Moderate cardiomegaly.  There was a 2.3 cm right thyroid heterogeneous nodule.  Venous Doppler studies lower extremities no DVT.  Admission chemistries WBC 27,100 CK 3162 lactic acid 1.2.  Chest x-ray showed mild basilar atelectasis and/or infiltrate.  Patient was transferred to [facility] 08/30/2019 for ongoing follow-up and care.  Underwent diagnostic cerebral angiogram stent supported coil embolization of the basilar aneurysm 09/01/2019 per Dr. Kathyrn Sheriff.  EEG completed showing no seizure.  She underwent IVC filter placement 09/02/2019 per interventional radiology for pulmonary emboli.  She did remain on low-dose subcutaneous heparin for DVT prophylaxis.  Aspirin and Plavix later initiated after embolization.  Close monitoring of blood pressure.  Patient was admitted for a comprehensive rehab program.   Hospital Course: Haley Norman  Mayo was admitted to rehab 09/10/2019 for inpatient therapies to consist of PT, ST and OT at least three hours five days a week. Past admission physiatrist, therapy team and rehab RN have worked together to provide customized collaborative inpatient rehab.  Pertaining to patient's subarachnoid hemorrhage right greater than left status post basilar aneurysm with coil  embolization 09/01/2019.  Patient follow-up outpatient.  Hospital course complicated by pulmonary emboli IVC filter placed 09/02/2019.  She was later cleared to begin aspirin and Plavix for CVA prophylaxis after coil embolization.  Blood pressure monitored she was tapered off of nimotop and could resume her outpatient antihypertensive medications per primary MD.  Incidental findings of a 2.3 cm right thyroid heterogeneous nodule follow-up outpatient.  She did have a history of tobacco abuse receiving counseled regards to cessation of nicotine products.  Bouts of urinary retention placed on Urecholine improved with increased mobility.  She did have a small stage II sacral decubiti improved with skin care.   Blood pressures were monitored on TID basis and controlled   Rehab course: During patient's stay in rehab weekly team conferences were held to monitor patient's progress, set goals and discuss barriers to discharge. At admission, patient required moderate assist for ambulation mod assist supine to sit.  Minimal assist ADLs.  Physical exam.  Blood pressure 128/70 pulse 80 temperature 98 respirations 18 oxygen saturation 92% room air Constitutional alert and oriented able to follow simple commands HEENT Head.  Normocephalic and atraumatic Eyes.  Pupils round and reactive to light no discharge without nystagmus Neck.  Supple nontender no JVD without thyromegaly Cardiac regular rate rhythm without any extra sounds or murmur heard Abdomen.  Soft nontender positive bowel sounds without rebound Respiratory effort normal no respiratory distress without wheeze Musculoskeletal. Comments.  Right upper extremity 4+/5 in deltoids biceps triceps wrist extension grip and finger abduction Left upper extremity 4+/5 same muscles Right upper extremity hip flexors 2+/5 knee extension knee flexion 3+/5 dorsi plantar flexion 4+/5 Left lower extremity hip flexion 3+/5 knee extension knee flexion 4 -/5 dorsiflexion  plantarflexion 4+/5 Skin.  Superficial sacral stage II  Haley Mayo  has had improvement in activity tolerance, balance, postural control as well as ability to compensate for deficits. Haley Mayo has had improvement in functional use RUE/LUE  and RLE/LLE as well as improvement in awareness.  Patient ambulating 250 feet x 2 on level surfaces rolling walker.  Ambulated with decreased gait speed.  Occupational Therapy assist with ADLs made clear to family need for supervision for her care ambulates to the tub room with supervision.  OT demonstrated transfer onto TTD with patient returning demonstration with supervision.  Patient was discharged to home in [place] with her son plan to transition to [place].       Disposition: Discharge to home   Diet: Regular  Special Instructions: No driving smoking or alcohol  Medications on discharge 1.  Aspirin 325 mg daily 2.  Urecholine 50 mg 3 times daily 3.  Plavix 75 mg daily 4.  Multivitamin daily 5.  Imodium as needed 6.  MiraLAX daily hold for loose stools  Discharge Instructions    Ambulatory referral to Physical Medicine Rehab   Complete by: As directed    1-2 weeks TC appt     Allergies as of 10/01/2019   No Known Allergies     Medication List    STOP taking these medications   acetaminophen 325 MG tablet Commonly known as: TYLENOL   amLODipine-benazepril 10-20 MG capsule Commonly known as:  LOTREL   Healthy Heart Emul   niMODipine 30 MG capsule Commonly known as: NIMOTOP     TAKE these medications   aspirin 325 MG tablet Take 1 tablet (325 mg total) by mouth daily. Notes to patient: Blood thinner   bethanechol 50 MG tablet Commonly known as: URECHOLINE Take 1 tablet (50 mg total) by mouth 3 (three) times daily. Notes to patient: To help empty bladder.     clopidogrel 75 MG tablet Commonly known as: PLAVIX Take 1 tablet (75 mg total) by mouth daily. Notes to patient: Blood thinner   EMERGEN-C VITAMIN C PO Take 1 tablet by  mouth daily.   loperamide 2 MG capsule Commonly known as: IMODIUM Take 1 capsule (2 mg total) by mouth as needed for diarrhea or loose stools.   polyethylene glycol 17 g packet Commonly known as: MIRALAX / GLYCOLAX Take 17 g by mouth daily. Notes to patient: For constipation--available over the counter.        Follow-up Information    Consuella Lose, MD Follow up on 10/07/2019.   Specialty: Neurosurgery Why: Need to be there at 2:30 for appointment at  2:45 pm Contact information: [address] [place] [address] [phone]        Izora Ribas, MD Follow up.   Specialty: Physical Medicine and Rehabilitation Why: Dr. Charm Barges collegue who has seen you in the hospital. Office will call you with follow up appointment Contact information: [address] [place] [address] [phone]        Frazier Richards, MD Follow up.   Specialty: Family Medicine Why: Need to set post hospital follow up in 1-2 weeks.  Contact information: [place] [address] [phone]        Corrie Mckusick, DO Follow up.   Specialties: Interventional Radiology, Radiology Why: As needed--placed IVC filter Contact information: [address] [place] [address] [phone]               Signed: Lavon Paganini [place] 10/05/2019, 9:38 AM

## 2019-10-07 ENCOUNTER — Telehealth: Payer: Self-pay

## 2019-10-07 NOTE — Telephone Encounter (Signed)
SW spoke with [facility] (p:[phone]/f:[phone]) to discuss referral. Reports she will speak with Dr. Serita Grit and will follow-up with SW. SW waiting on follow-up.

## 2019-10-14 ENCOUNTER — Telehealth: Payer: Self-pay

## 2019-10-14 ENCOUNTER — Encounter: Payer: Medicare Other | Admitting: Physical Medicine and Rehabilitation

## 2019-10-14 NOTE — Telephone Encounter (Signed)
SW faxed FMAL forms to [place] (p:[phone]/f:[phone]). SW spoke with [facility] (p:[phone]/[phone])to discuss referral. States after speaking with Dr. Leonette Most nurse, pt information will be reviewed and will follow-up. SW emailed forms to patient son Virgel Gess.  *SW called pt son Kristopher to update on above. He informed SW that he has returned to [facility] after pt saw neurosurgery they said she was ok to be left alone, and will just need to follow-up in 5 months. He stated he was now on quarantine after returning home last week and will return to work on Tuesday. He states he may return within a few months. SW indicated will wait on follow-up from Dr. Leonette Most in the event that she decides to return to [facility] with him.

## 2019-11-03 ENCOUNTER — Other Ambulatory Visit: Payer: Self-pay | Admitting: Interventional Radiology

## 2019-11-03 DIAGNOSIS — Z86711 Personal history of pulmonary embolism: Secondary | ICD-10-CM

## 2019-11-04 ENCOUNTER — Other Ambulatory Visit: Payer: Self-pay | Admitting: Family Medicine

## 2019-11-04 DIAGNOSIS — E041 Nontoxic single thyroid nodule: Secondary | ICD-10-CM

## 2019-11-06 ENCOUNTER — Telehealth: Payer: Self-pay

## 2019-11-06 ENCOUNTER — Other Ambulatory Visit: Payer: Self-pay

## 2019-11-06 NOTE — Telephone Encounter (Signed)
Left message for Ms Lavell Islam. Notified to contact PCP for refill and call if she has further problems.

## 2019-11-06 NOTE — Telephone Encounter (Signed)
Her PCP should be refilling it, however if they refuse to refill, I can give her 30 days- she needs to continue it, but usually Neurology vs PCP write for it- not [place].   Let me know, please.  Also tell her that she NEEDS to see PCP- because they need to f/u on what's going on- there's a reason she was in [facility] and she doesn't want to go back again- needs Plavix per rehab chart- it doesn't say when or if she's to ever stop it.

## 2019-11-06 NOTE — Telephone Encounter (Signed)
Patient was advised to call PCP for Plavix medication management. Or we can send in a 30 day supply.    She called back to report Dr. Sherril Cong  (PCP) will prescribe Plavix.   Thank you.

## 2019-11-06 NOTE — Telephone Encounter (Signed)
Dr. Ranell Patrick is out of the office:   Mrs. Boney cancelled her hospital follow up appointment. Per patient it cost he $80 to get here.   She wanted  to know if she needs to continue Plavix? If so she will need a refill.   Please advise or prescribe. Thank you.

## 2019-11-30 ENCOUNTER — Other Ambulatory Visit: Payer: Self-pay

## 2019-11-30 ENCOUNTER — Ambulatory Visit
Admission: RE | Admit: 2019-11-30 | Discharge: 2019-11-30 | Disposition: A | Discharge status: Home / Self Care | Payer: Medicare Other | Source: Ambulatory Visit | Attending: Family Medicine | Admitting: Family Medicine

## 2019-11-30 ENCOUNTER — Ambulatory Visit
Admission: RE | Admit: 2019-11-30 | Discharge: 2019-11-30 | Disposition: A | Discharge status: Home / Self Care | Payer: Medicare Other | Source: Ambulatory Visit | Attending: Interventional Radiology | Admitting: Interventional Radiology

## 2019-11-30 DIAGNOSIS — E041 Nontoxic single thyroid nodule: Secondary | ICD-10-CM | POA: Insufficient documentation

## 2019-11-30 DIAGNOSIS — Z86711 Personal history of pulmonary embolism: Secondary | ICD-10-CM | POA: Insufficient documentation

## 2019-12-08 ENCOUNTER — Other Ambulatory Visit: Payer: Self-pay

## 2019-12-08 ENCOUNTER — Other Ambulatory Visit: Payer: Medicare Other

## 2019-12-08 ENCOUNTER — Telehealth: Payer: Medicare Other

## 2019-12-08 ENCOUNTER — Ambulatory Visit
Admission: RE | Admit: 2019-12-08 | Discharge: 2019-12-08 | Disposition: A | Discharge status: Home / Self Care | Payer: Medicare Other | Source: Ambulatory Visit | Attending: Interventional Radiology | Admitting: Interventional Radiology

## 2019-12-08 DIAGNOSIS — Z86711 Personal history of pulmonary embolism: Secondary | ICD-10-CM

## 2019-12-08 HISTORY — PX: IR RADIOLOGIST EVAL & MGMT: IMG5224

## 2019-12-08 NOTE — Consult Note (Addendum)
Chief Complaint: IVC filter  Referring Physician(s): [facility]  PCP: Dr. Beverlyn Roux  History of Present Illness: Haley Mayo is a 69 y.o. female presenting today as a scheduled follow up to [place] clinic, for evaluation of her IVC filter, and possible candidacy for removal.   Haley Mayo joins [place] today virtually, given COVID.  We confirmed her identity with 2 personal identifiers.   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  Her admission was 08/30/19.  Chest CT performed on her admission also demonstrated a small volume PE.   Her admission venous duplex lower extremity was negative.   She was not a candidate for [facility], and thus we placed a retrievable IVC filter on 09/02/19 because of her status.   She tells me that she has done fine with her recovery, although she is a bit unsteady at times.  She uses a walker.  She tells me that she does not go out very much.  She tells me she likes to "keep to herself", and she does not have any local family.  She is from [place], and her son is still there.  She moved her for a job with [employer], and she has now retired.   She tells me that a lot of the details regarding her hospitalization have been after the fact, many via her medical bills.  She apparently did not have much of an advocate for navigation and for medical info during and after her hospitalization.  Her son was only able to come after a few weeks time.   She denies any history of PE/DVT.  She denies any knowledge of any family member with history of PE/DVT.   LE Duplex performed 11/30/19 is negative for DVT.   Past Medical History:  Diagnosis Date  . HLD (hyperlipidemia)   . HTN (hypertension)    controlled  . Leukocytosis    ?   Marland Kitchen Neoplasm of ear    left ear lobe  . Obesity   . Wears dentures    upper    Past Surgical History:  Procedure Laterality Date  . ABDOMINAL HYSTERECTOMY  Age 60  .  COLONOSCOPY  09/2004   normal, rpt 10 yrs  . EAR CYST EXCISION Left 09/27/2017   Procedure: EXCISION EAR LOBE CYST;  Surgeon: Haley Mayo;  Location: [place];  Service: ENT;  Laterality: Left;  LOCAL ONLY  . IR ANGIO VERTEBRAL SEL VERTEBRAL UNI L MOD SED  09/01/2019  . IR ANGIOGRAM FOLLOW UP STUDY  09/01/2019  . IR ANGIOGRAM FOLLOW UP STUDY  09/01/2019  . IR ANGIOGRAM FOLLOW UP STUDY  09/01/2019  . IR ANGIOGRAM FOLLOW UP STUDY  09/01/2019  . IR ANGIOGRAM FOLLOW UP STUDY  09/01/2019  . IR IVC FILTER PLMT / S&I /IMG GUID/MOD SED  09/02/2019  . IR NEURO EACH ADD'L AFTER BASIC UNI LEFT (Haley)  09/01/2019  . IR TRANSCATH/EMBOLIZ  09/01/2019  . OOPHORECTOMY    . RADIOLOGY WITH ANESTHESIA N/A 09/01/2019   Procedure: IR WITH ANESTHESIA;  Surgeon: Consuella Lose, Mayo;  Location: [place];  Service: Radiology;  Laterality: N/A;  . TOTAL VAGINAL HYSTERECTOMY  05/2001   heavy bleeding, fibroids    Allergies: Patient has no known allergies.  Medications: Prior to Admission medications   Medication Sig Start Date End Date Taking? Authorizing Provider  aspirin 325 MG tablet Take 1 tablet (325 mg total) by mouth daily. 10/01/19  Love, Ivan Anchors, PA-C  bethanechol (URECHOLINE) 50 MG tablet Take 1 tablet (50 mg total) by mouth 3 (three) times daily. 10/01/19   Love, Ivan Anchors, PA-C  clopidogrel (PLAVIX) 75 MG tablet Take 1 tablet (75 mg total) by mouth daily. 10/01/19   Love, Ivan Anchors, PA-C  loperamide (IMODIUM) 2 MG capsule Take 1 capsule (2 mg total) by mouth as needed for diarrhea or loose stools. 10/01/19   Love, Ivan Anchors, PA-C  Multiple Vitamins-Minerals (EMERGEN-C VITAMIN C PO) Take 1 tablet by mouth daily.     Provider, Historical, Mayo  polyethylene glycol (MIRALAX / GLYCOLAX) 17 g packet Take 17 g by mouth daily. 10/01/19   Bary Leriche, PA-C     Family History  Problem Relation Age of Onset  . Ulcers Father   . Pneumonia Mother   . Diabetes Sister   . Stroke Brother   . Lupus Sister     . Stroke Sister        hemorrhage after fall  . Lupus Sister   . Rheum arthritis Sister        ?  . Diabetes Sister   . Other Sister        peritoneal dialysis    Social History   Socioeconomic History  . Marital status: Divorced    Spouse name: Not on file  . Number of children: 2  . Years of education: Not on file  . Highest education level: Not on file  Occupational History  . Occupation: Psychologist, clinical: [employer]  Tobacco Use  . Smoking status: Current Every Day Smoker    Packs/day: 1.00    Years: 40.00    Pack years: 40.00    Types: Cigarettes  . Smokeless tobacco: Current User  . Tobacco comment: Quit in 2009--restarted  Substance and Sexual Activity  . Alcohol use: No  . Drug use: No  . Sexual activity: Not on file  Other Topics Concern  . 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 Difficulty of Paying Living Expenses: Not on file  Food Insecurity:   . Worried About Charity fundraiser in the Last Year: Not on file  . Ran Out of Food in the Last Year: Not on file  Transportation Needs:   . Lack of Transportation (Medical): Not on file  . Lack of Transportation (Non-Medical): Not on file  Physical Activity:   . Days of Exercise per Week: Not on file  . Minutes of Exercise per Session: Not on file  Stress:   . Feeling of Stress : Not on file  Social Connections:   . Frequency of Communication with Friends and Family: Not on file  . Frequency of Social Gatherings with Friends and Family: Not on file  . Attends Religious Services: Not on file  . Active Member of Clubs or Organizations: Not on file  . Attends Archivist Meetings: Not on file  . Marital Status: Not on file       Review of Systems: A 12 point  ROS discussed and pertinent positives are indicated in the HPI above.  All other systems are negative.  Review of Systems  Vital Signs: There  were no vitals taken for this visit.  Physical Exam  Mallampati Score:     Imaging: US Venous Img Lower Bilateral (DVT)  Result Date: 11/30/2019 CLINICAL DATA:  69 year old female with a history of pulmonary embolus. Evaluate for DVT. EXAM: BILATERAL LOWER EXTREMITY VENOUS DOPPLER ULTRASOUND TECHNIQUE: Gray-scale sonography with graded compression, as well as color Doppler and duplex ultrasound were performed to evaluate the lower extremity deep venous systems from the level of the common femoral vein and including the common femoral, femoral, profunda femoral, popliteal and calf veins including the posterior tibial, peroneal and gastrocnemius veins when visible. The superficial great saphenous vein was also interrogated. Spectral Doppler was utilized to evaluate flow at rest and with distal augmentation maneuvers in the common femoral, femoral and popliteal veins. COMPARISON:  None. FINDINGS: RIGHT LOWER EXTREMITY Common Femoral Vein: No evidence of thrombus. Normal compressibility, respiratory phasicity and response to augmentation. Saphenofemoral Junction: No evidence of thrombus. Normal compressibility and flow on color Doppler imaging. Profunda Femoral Vein: No evidence of thrombus. Normal compressibility and flow on color Doppler imaging. Femoral Vein: No evidence of thrombus. Normal compressibility, respiratory phasicity and response to augmentation. Popliteal Vein: No evidence of thrombus. Normal compressibility, respiratory phasicity and response to augmentation. Calf Veins: No evidence of thrombus. Normal compressibility and flow on color Doppler imaging. Superficial Great Saphenous Vein: No evidence of thrombus. Normal compressibility. Venous Reflux:  None. Other Findings:  None. LEFT LOWER EXTREMITY Common Femoral Vein: No evidence of thrombus.  Normal compressibility, respiratory phasicity and response to augmentation. Saphenofemoral Junction: No evidence of thrombus. Normal compressibility and flow on color Doppler imaging. Profunda Femoral Vein: No evidence of thrombus. Normal compressibility and flow on color Doppler imaging. Femoral Vein: No evidence of thrombus. Normal compressibility, respiratory phasicity and response to augmentation. Popliteal Vein: No evidence of thrombus. Normal compressibility, respiratory phasicity and response to augmentation. Calf Veins: No evidence of thrombus. Normal compressibility and flow on color Doppler imaging. Superficial Great Saphenous Vein: No evidence of thrombus. Normal compressibility. Venous Reflux:  None. Other Findings:  None. IMPRESSION: No evidence of deep venous thrombosis. Electronically Signed   By: Jacqulynn Cadet M.D.   On: 11/30/2019 15:10   US THYROID  Result Date: 12/01/2019 CLINICAL DATA:  Thyroid nodule. EXAM: THYROID ULTRASOUND TECHNIQUE: Ultrasound examination of the thyroid gland and adjacent soft tissues was performed. COMPARISON:  None. FINDINGS: Parenchymal Echotexture: Mildly heterogenous Isthmus: 0.3 cm Right lobe: 5.3 x 2.3 x 2.4 cm Left lobe: 3.5 x 1.2 x 1.3 cm _________________________________________________________ Estimated total number of nodules >/= 1 cm: 1 Number of spongiform nodules >/=  2 cm not described below (TR1): 0 Number of mixed cystic and solid nodules >/= 1.5 cm not described below (TR2): 0 _________________________________________________________ Nodule # 1: Location: Right; Mid Maximum size: 2.7 cm; Other 2 dimensions: 2.0 x 2.3 cm Composition: solid/almost completely solid (2) Echogenicity: hypoechoic (2) Shape: not taller-than-wide (0) Margins: smooth (0) Echogenic foci: none (0) ACR TI-RADS total points: 4. ACR TI-RADS risk category: TR4 (4-6 points). ACR TI-RADS recommendations: **Given size (>/= 1.5 cm) and appearance, fine needle aspiration of this  moderately suspicious nodule should be considered based on TI-RADS criteria. _________________________________________________________ Small nodules in left thyroid lobe that do not meet criteria for biopsy or dedicated follow-up. Largest left thyroid nodule measures up to 0.6 cm. IMPRESSION: Right thyroid nodule measuring up to 2.7 cm. This is a TR 4 nodule and meets criteria for ultrasound-guided biopsy. The above is in keeping with the ACR TI-RADS recommendations -  [place] [id number]. Electronically Signed   By: Markus Daft M.D.   On: 12/01/2019 13:18    Labs:  CBC: Recent Labs    09/09/19 0614 09/10/19 0906 09/11/19 0745 09/21/19 0948  WBC 16.7* 16.8* 14.4* 7.7  HGB 11.5* 11.9* 12.3 10.6*  HCT 35.5* 37.0 36.9 32.7*  PLT 442* 470* 476* 271    COAGS: Recent Labs    08/31/19 0321  INR 1.1  APTT 32    BMP: Recent Labs    09/11/19 0745 09/17/19 0552 09/18/19 0639 09/21/19 0948  NA 138 140 140 140  K 4.1 4.2 4.1 3.8  CL 101 110 111 111  CO2 25 19* 18* 20*  GLUCOSE 108* 98 107* 102*  BUN 18 31* 22 21  CALCIUM 10.0 9.7 9.4 9.2  CREATININE 0.64 0.93 0.85 0.94  GFRNONAA >60 >60 >60 >60  GFRAA >60 >60 >60 >60    LIVER FUNCTION TESTS: Recent Labs    08/27/19 0453 08/28/19 0510 08/30/19 0702 09/11/19 0745  BILITOT 0.7 0.9 0.7 0.4  AST 47* 30 21 26   ALT 53* 45* 39 85*  ALKPHOS 69 63 68 85  PROT 7.0 6.8 7.1 7.2  ALBUMIN 3.3* 3.1* 3.1* 3.7    TUMOR MARKERS: No results for input(s): AFPTM, CEA, CA199, CHROMGRNA in the last 8760 hours.  Assessment and Plan:  Haley Heft presents today with a retrievable IVC filter in place, deployed in May 2021 during a hospitalization for SAH/ruptured brain aneurysm, as she had PE diagnosed on CT, and was not a candidate for [facility].   I had a lengthy discussion with her regarding her PE and her retrievable IVC filter, the indication for placement, and the indication for removal.    Her PE can be classified as a  provoked PE, given her critical illness at the time of occurrence.   I informed her that ordinarily someone requiring AC for provoked PE but not a candidate during hospitalization, might be successfully started on [facility] after hospitalization.  At that point, a retrievable filter can be removed.  In this scenario, my impression is that a 6 month treatment window can be assumed (even though she was not started on [facility]), and that after 6 months has passed the filter would be superfluous.   I did let her know that we want to discuss with her PCP.    She understands, and we will follow up after we have established our treatment plan.   Plan: - We will follow up with PCP.  Please CC/FAX copy of this report to Dr. Beverlyn Roux of [facility].  - Anticipate possible IVC filter retrieval after 6 month interval post-event for her provoked thromboembolic disease has expired, which would be after November 16th.  - We will plan on another call in/around October to discuss with her possible retrieval.     Thank you for this interesting consult.  I greatly enjoyed meeting CALIAH KOPKE and look forward to participating in their care.  A copy of this report was sent to the requesting provider on this date.  Electronically Signed: Corrie Mckusick 12/08/2019, 12:01 PM   I spent a total of  40 Minutes   in face to face in clinical consultation, greater than 50% of which was counseling/coordinating care for PE/DVT, retrievable IVC filter, possible removal

## 2020-01-07 ENCOUNTER — Other Ambulatory Visit: Payer: Self-pay | Admitting: Interventional Radiology

## 2020-01-07 DIAGNOSIS — Z86711 Personal history of pulmonary embolism: Secondary | ICD-10-CM

## 2020-01-27 ENCOUNTER — Other Ambulatory Visit: Payer: Self-pay

## 2020-01-27 ENCOUNTER — Ambulatory Visit (INDEPENDENT_AMBULATORY_CARE_PROVIDER_SITE_OTHER): Payer: Medicare Other | Admitting: Otolaryngology

## 2020-01-27 ENCOUNTER — Encounter (INDEPENDENT_AMBULATORY_CARE_PROVIDER_SITE_OTHER): Payer: Self-pay | Admitting: Otolaryngology

## 2020-01-27 VITALS — Temp 98.1°F

## 2020-01-27 DIAGNOSIS — E041 Nontoxic single thyroid nodule: Secondary | ICD-10-CM

## 2020-01-27 NOTE — Progress Notes (Addendum)
HPI: Haley Mayo is a 69 y.o. female who presents is referred by Dr. Beverlyn Roux, MD for evaluation of right thyroid nodule.  Patient apparently fainted or blacked out at home in May at which time she was taken to the hospital via ambulance and subsequently was found to have a subarachnoid hemorrhage.  She had several scans performed at that time and had placement of a inferior venous filter.  She was hospitalized for over a month.  During her hospitalization she had complications with a PE and IVC filter placement.  She was subsequently transitioned to rehab and discharged on 10/01/2019.  She lives alone.  During her evaluation in the hospital she was noted to have a right thyroid nodule and underwent subsequent ultrasound performed last month that demonstrated a 2-1/2 cm right thyroid nodule that warranted biopsy.  She is otherwise asymptomatic with normal thyroid studies.  She has has no hoarseness and no trouble swallowing. She has been placed on blood thinners and presently on Plavix and aspirin..  Past Medical History:  Diagnosis Date  . HLD (hyperlipidemia)   . HTN (hypertension)    controlled  . Leukocytosis    ?   Marland Kitchen Neoplasm of ear    left ear lobe  . Obesity   . Wears dentures    upper   Past Surgical History:  Procedure Laterality Date  . ABDOMINAL HYSTERECTOMY  Age 58  . COLONOSCOPY  09/2004   normal, rpt 10 yrs  . EAR CYST EXCISION Left 09/27/2017   Procedure: EXCISION EAR LOBE CYST;  Surgeon: Beverly Gust, MD;  Location: [place];  Service: ENT;  Laterality: Left;  LOCAL ONLY  . IR ANGIO VERTEBRAL SEL VERTEBRAL UNI L MOD SED  09/01/2019  . IR ANGIOGRAM FOLLOW UP STUDY  09/01/2019  . IR ANGIOGRAM FOLLOW UP STUDY  09/01/2019  . IR ANGIOGRAM FOLLOW UP STUDY  09/01/2019  . IR ANGIOGRAM FOLLOW UP STUDY  09/01/2019  . IR ANGIOGRAM FOLLOW UP STUDY  09/01/2019  . IR IVC FILTER PLMT / S&I /IMG GUID/MOD SED  09/02/2019  . IR NEURO EACH ADD'L AFTER BASIC UNI LEFT (MS)   09/01/2019  . IR RADIOLOGIST EVAL & MGMT  12/08/2019  . IR TRANSCATH/EMBOLIZ  09/01/2019  . OOPHORECTOMY    . RADIOLOGY WITH ANESTHESIA N/A 09/01/2019   Procedure: IR WITH ANESTHESIA;  Surgeon: Consuella Lose, MD;  Location: [place];  Service: Radiology;  Laterality: N/A;  . TOTAL VAGINAL HYSTERECTOMY  05/2001   heavy bleeding, fibroids   Social History   Socioeconomic History  . Marital status: Divorced    Spouse name: Not on file  . Number of children: 2  . Years of education: Not on file  . Highest education level: Not on file  Occupational History  . Occupation: Psychologist, clinical: [employer]  Tobacco Use  . Smoking status: Current Every Day Smoker    Packs/day: 1.00    Years: 40.00    Pack years: 40.00    Types: Cigarettes    Start date: 62  . Smokeless tobacco: Current User  . Tobacco comment: Quit in 2009--restarted  Substance and Sexual Activity  . Alcohol use: No  . Drug use: No  . Sexual activity: Not on file  Other Topics Concern  . 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 Difficulty of Paying Living Expenses: Not on file  Food Insecurity:   . Worried About Charity fundraiser in the Last Year: Not on file  . Ran Out of Food in the Last Year: Not on file  Transportation Needs:   . Lack of Transportation (Medical): Not on file  . Lack of Transportation (Non-Medical): Not on file  Physical Activity:   . Days of Exercise per Week: Not on file  . Minutes of Exercise per Session: Not on file  Stress:   . Feeling of Stress : Not on file  Social Connections:   . Frequency of Communication with Friends and Family: Not on file  . Frequency of Social Gatherings with Friends and Family: Not on file  . Attends Religious Services: Not  on file  . Active Member of Clubs or Organizations: Not on file  . Attends Archivist Meetings: Not on file  . Marital Status: Not on file   Family History  Problem Relation Age of Onset  . Ulcers Father   . Pneumonia Mother   . Diabetes Sister   . Stroke Brother   . Lupus Sister   . Stroke Sister        hemorrhage after fall  . Lupus Sister   . Rheum arthritis Sister        ?  . Diabetes Sister   . Other Sister        peritoneal dialysis   No Known Allergies Prior to Admission medications   Medication Sig Start Date End Date Taking? Authorizing Provider  aspirin 325 MG tablet Take 1 tablet (325 mg total) by mouth daily. 10/01/19   Love, Ivan Anchors, PA-C  bethanechol (URECHOLINE) 50 MG tablet Take 1 tablet (50 mg total) by mouth 3 (three) times daily. 10/01/19   Love, Ivan Anchors, PA-C  clopidogrel (PLAVIX) 75 MG tablet Take 1 tablet (75 mg total) by mouth daily. 10/01/19   Love, Ivan Anchors, PA-C  loperamide (IMODIUM) 2 MG capsule Take 1 capsule (2 mg total) by mouth as needed for diarrhea or loose stools. 10/01/19   Love, Ivan Anchors, PA-C  Multiple Vitamins-Minerals (EMERGEN-C VITAMIN C PO) Take 1 tablet by mouth daily.     [provider]  polyethylene glycol (MIRALAX / GLYCOLAX) 17 g packet Take 17 g by mouth daily. 10/01/19   Love, Ivan Anchors, PA-C     Positive ROS: Otherwise negative  All other systems have been reviewed and were otherwise negative with the exception of those mentioned in the HPI and as above.  Physical Exam: Constitutional: Alert, well-appearing, no acute distress.  She has no hoarseness and no airway problems. Ears: External ears without lesions or tenderness. Ear canals are clear bilaterally with intact, clear TMs.  Nasal: External nose without lesions.. Clear nasal passages Oral: Lips and gums without lesions. Tongue and palate mucosa without lesions. Posterior oropharynx clear.  Indirect laryngoscopy revealed a clear hypopharynx and larynx with  normal vocal cord mobility bilaterally. Neck: Barely palpable right thyroid nodule.  No supraclavicular adenopathy noted and no adenopathy noted along the jugular chain of lymph nodes.Marland Kitchen Respiratory: Breathing comfortably  Skin: No facial/neck lesions or rash noted.  Procedures  Assessment: Right thyroid nodule which warrants fine-needle aspirate.  Plan: We will plan on scheduling patient for an ultrasound-guided fine-needle aspirate of the right thyroid nodule.  She would like to wait until after she has the IVC filter removed next month and this should be fine.  We will schedule this the first week of December.  She will call us concerning results of the needle aspirate following the procedure.   Radene Journey, MD   CC:

## 2020-02-11 ENCOUNTER — Other Ambulatory Visit: Payer: Self-pay

## 2020-02-11 ENCOUNTER — Other Ambulatory Visit (INDEPENDENT_AMBULATORY_CARE_PROVIDER_SITE_OTHER): Payer: Self-pay

## 2020-02-11 ENCOUNTER — Encounter: Payer: Self-pay | Admitting: *Deleted

## 2020-02-11 ENCOUNTER — Ambulatory Visit
Admission: RE | Admit: 2020-02-11 | Discharge: 2020-02-11 | Disposition: A | Discharge status: Home / Self Care | Payer: Medicare Other | Source: Ambulatory Visit | Attending: Interventional Radiology | Admitting: Interventional Radiology

## 2020-02-11 DIAGNOSIS — Z86711 Personal history of pulmonary embolism: Secondary | ICD-10-CM

## 2020-02-11 DIAGNOSIS — E041 Nontoxic single thyroid nodule: Secondary | ICD-10-CM

## 2020-02-11 HISTORY — PX: IR RADIOLOGIST EVAL & MGMT: IMG5224

## 2020-02-11 NOTE — Progress Notes (Signed)
US

## 2020-02-11 NOTE — Progress Notes (Signed)
Chief Complaint: IVC filter presence    Referring Physician(s): Haley Mayo  PCP: Haley Mayo  History of Present Illness: Haley Mayo is a 69 y.o. female presenting today as a scheduled follow up to [facility], to discuss retrieval of her IVC filter.   [name] joins [place] today virtually, given COVID.  We confirmed her identity with 2 personal identifiers.   We last saw her 12/08/2019, to start our conversation.  This date was within a 78-month window of her hospitalization in May 2021. 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  Her admission was 08/30/19.  Chest CT performed on her admission also demonstrated a small volume PE.   Her admission venous duplex lower extremity was negative.   She was not a candidate for [facility], and thus we placed a retrievable IVC filter on 09/02/19 because of her status.   She feels that she is doing just fine.  She is able to perform all of her ADL's, including [place] yard work.  "They are always driving by to make sure you dont let it get 12 inches long."    She denies any new symptoms such as new SOB, CP, lower extremity swelling/pain.    LE Duplex performed 11/30/19 is negative for DVT  Past Medical History:  Diagnosis Date  . HLD (hyperlipidemia)   . HTN (hypertension)    controlled  . Leukocytosis    ?   Marland Kitchen Neoplasm of ear    left ear lobe  . Obesity   . Wears dentures    upper    Past Surgical History:  Procedure Laterality Date  . ABDOMINAL HYSTERECTOMY  Age 73  . COLONOSCOPY  09/2004   normal, rpt 10 yrs  . EAR CYST EXCISION Left 09/27/2017   Procedure: EXCISION EAR LOBE CYST;  Surgeon: Haley Gust, MD;  Location: [facility];  Service: ENT;  Laterality: Left;  LOCAL ONLY  . IR ANGIO VERTEBRAL SEL VERTEBRAL UNI L MOD SED  09/01/2019  . IR ANGIOGRAM FOLLOW UP STUDY  09/01/2019  . IR ANGIOGRAM FOLLOW UP STUDY  09/01/2019  . IR ANGIOGRAM FOLLOW UP  STUDY  09/01/2019  . IR ANGIOGRAM FOLLOW UP STUDY  09/01/2019  . IR ANGIOGRAM FOLLOW UP STUDY  09/01/2019  . IR IVC FILTER PLMT / S&I /IMG GUID/MOD SED  09/02/2019  . IR NEURO EACH ADD'L AFTER BASIC UNI LEFT (MS)  09/01/2019  . IR RADIOLOGIST EVAL & MGMT  12/08/2019  . IR TRANSCATH/EMBOLIZ  09/01/2019  . OOPHORECTOMY    . RADIOLOGY WITH ANESTHESIA N/A 09/01/2019   Procedure: IR WITH ANESTHESIA;  Surgeon: Haley Lose, MD;  Location: [facility];  Service: Radiology;  Laterality: N/A;  . TOTAL VAGINAL HYSTERECTOMY  05/2001   heavy bleeding, fibroids    Allergies: Patient has no known allergies.  Medications: Prior to Admission medications   Medication Sig Start Date End Date Taking? Authorizing Provider  aspirin 325 MG tablet Take 1 tablet (325 mg total) by mouth daily. 10/01/19   Haley Mayo, Haley Anchors, PA-C  bethanechol (URECHOLINE) 50 MG tablet Take 1 tablet (50 mg total) by mouth 3 (three) times daily. 10/01/19   Haley Mayo, Haley Anchors, PA-C  clopidogrel (PLAVIX) 75 MG tablet Take 1 tablet (75 mg total) by mouth daily. 10/01/19   Haley Mayo, Haley Anchors, PA-C  loperamide (IMODIUM) 2 MG capsule Take 1 capsule (2 mg total) by mouth as needed for diarrhea or loose stools.  10/01/19   Haley Mayo, Haley Anchors, PA-C  Multiple Vitamins-Minerals (EMERGEN-C VITAMIN C PO) Take 1 tablet by mouth daily.     [provider]  polyethylene glycol (MIRALAX / GLYCOLAX) 17 g packet Take 17 g by mouth daily. 10/01/19   Haley Leriche, PA-C     Family History  Problem Relation Age of Onset  . Ulcers Father   . Pneumonia Mother   . Diabetes Sister   . Stroke Brother   . Lupus Sister   . Stroke Sister        hemorrhage after fall  . Lupus Sister   . Rheum arthritis Sister        ?  . Diabetes Sister   . Other Sister        peritoneal dialysis    Social History   Socioeconomic History  . Marital status: Divorced    Spouse name: Not on file  . Number of children: 2  . Years of education: Not on file  . Highest education  level: Not on file  Occupational History  . Occupation: Psychologist, clinical: [employer]  Tobacco Use  . Smoking status: Current Every Day Smoker    Packs/day: 1.00    Years: 40.00    Pack years: 40.00    Types: Cigarettes    Start date: 51  . Smokeless tobacco: Current User  . Tobacco comment: Quit in 2009--restarted  Substance and Sexual Activity  . Alcohol use: No  . Drug use: No  . Sexual activity: Not on file  Other Topics Concern  . Not on file  Social History Narrative   Works at [employer])   DOESN'T WANT LABS DONE AT [facility] alone   2 children (daughter and son)   No pets   From [place]   14 siblings total   Social Determinants of Health   Financial Resource Strain:   . Difficulty of Paying Living Expenses: Not on file  Food Insecurity:   . Worried About Charity fundraiser in [place] Last Year: Not on file  . Ran Out of Food in [place] Last Year: Not on file  Transportation Needs:   . Lack of Transportation (Medical): Not on file  . Lack of Transportation (Non-Medical): Not on file  Physical Activity:   . Days of Exercise per Week: Not on file  . Minutes of Exercise per Session: Not on file  Stress:   . Feeling of Stress : Not on file  Social Connections:   . Frequency of Communication with Friends and Family: Not on file  . Frequency of Social Gatherings with Friends and Family: Not on file  . Attends Religious Services: Not on file  . Active Member of Clubs or Organizations: Not on file  . Attends Archivist Meetings: Not on file  . Marital Status: Not on file       Review of Systems  Review of Systems: A 12 point ROS discussed and pertinent positives are indicated in Haley HPI above.  All other systems are negative.  Physical Exam No direct physical exam was performed (except for noted visual exam findings with Video Visits).     Vital Signs: There were no vitals taken for this visit.  Imaging: No results  found.  Labs:  CBC: Recent Labs    09/09/19 0614 09/10/19 0906 09/11/19 0745 09/21/19 0948  WBC 16.7* 16.8* 14.4* 7.7  HGB 11.5* 11.9* 12.3 10.6*  HCT 35.5* 37.0 36.9  32.7*  PLT 442* 470* 476* 271    COAGS: Recent Labs    08/31/19 0321  INR 1.1  APTT 32    BMP: Recent Labs    09/11/19 0745 09/17/19 0552 09/18/19 0639 09/21/19 0948  NA 138 140 140 140  K 4.1 4.2 4.1 3.8  CL 101 110 111 111  CO2 25 19* 18* 20*  GLUCOSE 108* 98 107* 102*  BUN 18 31* 22 21  CALCIUM 10.0 9.7 9.4 9.2  CREATININE 0.64 0.93 0.85 0.94  GFRNONAA >60 >60 >60 >60  GFRAA >60 >60 >60 >60    LIVER FUNCTION TESTS: Recent Labs    08/27/19 0453 08/28/19 0510 08/30/19 0702 09/11/19 0745  BILITOT 0.7 0.9 0.7 0.4  AST 47* 30 21 26   ALT 53* 45* 39 85*  ALKPHOS 69 63 68 85  PROT 7.0 6.8 7.1 7.2  ALBUMIN 3.3* 3.1* 3.1* 3.7    TUMOR MARKERS: No results for input(s): AFPTM, CEA, CA199, CHROMGRNA in Haley last 8760 hours.  Assessment and Plan:  Ms Cunanan presents today to discuss IVC filter retrieval placed May 2021 during a hospitalization for SAH/ruptured brain aneurysm, as she had PE diagnosed on CT, and was not a candidate for [facility].  In mid November it will be 6 months after her provoked DVT, so removal is reasonable, which I told her.    I had a lengthy discussion with her regarding her PE and her retrievable IVC filter, Haley indication for placement, and Haley indication for removal.    This procedure has been fully reviewed with Haley patient and written informed consent has been obtained.  Specific risks include: bleeding, infection, need for further procedure/surgery, inability to retrieve, fracture, vein injury, contrast reaction, kidney injury, cardiopulmonary collapse, death.   She understands, and she would like to proceed with retrieval.   Plan: - Plan for IVC filter retrieval after November 16th.  - She can continue all of her medications    Electronically Signed: Corrie Mckusick 02/11/2020, 9:59 AM   I spent a total of    25 Minutes in remote  clinical consultation, greater than 50% of which was counseling/coordinating care for IVC filter presence, possible IVC filter retrieval.    Visit type: Audio only (telephone). Audio (no video) only due to patient's lack of internet/smartphone capability. Alternative for in-person consultation at [facility], [address] [place], [place], [place]. This visit type was conducted due to national recommendations for restrictions regarding [place] COVID-19 Pandemic (e.g. social distancing).  This format is felt to be most appropriate for this patient at this time.  All issues noted in this document were discussed and addressed.

## 2020-02-16 ENCOUNTER — Other Ambulatory Visit (HOSPITAL_COMMUNITY): Payer: Self-pay | Admitting: Interventional Radiology

## 2020-02-16 DIAGNOSIS — I82409 Acute embolism and thrombosis of unspecified deep veins of unspecified lower extremity: Secondary | ICD-10-CM

## 2020-03-07 ENCOUNTER — Telehealth (HOSPITAL_COMMUNITY): Payer: Self-pay | Admitting: Radiology

## 2020-03-07 NOTE — Telephone Encounter (Signed)
Called pt to schedule IVC filter removal with [facility]. Pt states that she has no ride to or from her procedure other than the bus. She does not have anyone to stay with her after the procedure either. I will discuss with Earleen Newport and call her back tomorrow. JM

## 2020-05-30 ENCOUNTER — Telehealth (INDEPENDENT_AMBULATORY_CARE_PROVIDER_SITE_OTHER): Payer: Self-pay

## 2020-05-30 ENCOUNTER — Other Ambulatory Visit (INDEPENDENT_AMBULATORY_CARE_PROVIDER_SITE_OTHER): Payer: Self-pay

## 2020-05-30 DIAGNOSIS — D440 Neoplasm of uncertain behavior of thyroid gland: Secondary | ICD-10-CM

## 2020-05-31 ENCOUNTER — Telehealth: Payer: Self-pay | Admitting: *Deleted

## 2020-05-31 ENCOUNTER — Telehealth (INDEPENDENT_AMBULATORY_CARE_PROVIDER_SITE_OTHER): Payer: Self-pay

## 2020-05-31 NOTE — Telephone Encounter (Signed)
That would be her primary. We haven't seen her since discharge from rehab 8 mos ago

## 2020-05-31 NOTE — Telephone Encounter (Signed)
Dr [facility] office called and they are looking for who would be giving clearance for this pt to be off plavix for a procedure.  Please advise.

## 2020-05-31 NOTE — Telephone Encounter (Signed)
Notified Olin Hauser in Dr [facility] office.

## 2020-06-23 ENCOUNTER — Other Ambulatory Visit: Payer: Self-pay | Admitting: Neurosurgery

## 2020-06-23 DIAGNOSIS — I609 Nontraumatic subarachnoid hemorrhage, unspecified: Secondary | ICD-10-CM

## 2020-07-08 ENCOUNTER — Ambulatory Visit
Admission: RE | Admit: 2020-07-08 | Discharge: 2020-07-08 | Disposition: A | Discharge status: Home / Self Care | Payer: Medicare Other | Source: Ambulatory Visit | Attending: Neurosurgery | Admitting: Neurosurgery

## 2020-07-08 DIAGNOSIS — I609 Nontraumatic subarachnoid hemorrhage, unspecified: Secondary | ICD-10-CM

## 2020-07-08 MED ORDER — IOPAMIDOL (ISOVUE-370) INJECTION 76%
75.0000 mL | Freq: Once | INTRAVENOUS | Status: AC | PRN
  Administered 2020-07-08: 75 mL via INTRAVENOUS

## 2020-08-02 ENCOUNTER — Telehealth (HOSPITAL_COMMUNITY): Payer: Self-pay

## 2020-08-02 NOTE — Telephone Encounter (Signed)
Called to schedule ivc filter retrieval, no answer, left vm. AW 

## 2020-08-11 ENCOUNTER — Other Ambulatory Visit: Payer: Self-pay

## 2020-08-11 ENCOUNTER — Ambulatory Visit
Admission: RE | Admit: 2020-08-11 | Discharge: 2020-08-11 | Disposition: A | Discharge status: Home / Self Care | Payer: Medicare Other | Source: Ambulatory Visit | Attending: Otolaryngology | Admitting: Otolaryngology

## 2020-08-11 DIAGNOSIS — E041 Nontoxic single thyroid nodule: Secondary | ICD-10-CM | POA: Diagnosis present

## 2020-08-11 DIAGNOSIS — D440 Neoplasm of uncertain behavior of thyroid gland: Secondary | ICD-10-CM

## 2020-08-11 NOTE — Procedures (Signed)
Successful US guided FNA of right mid thyroid nodule No complications. If repeat or future thyroid nodule biopsy required please consider ordering procedure with moderate sedation for patient comfort.  See PACS for full report.  Soyla Dryer, [place] [phone] 08/11/2020, 1:47 PM

## 2020-08-12 LAB — CYTOLOGY - NON PAP

## 2021-07-04 ENCOUNTER — Other Ambulatory Visit: Payer: Self-pay | Admitting: Neurosurgery

## 2021-07-04 DIAGNOSIS — I609 Nontraumatic subarachnoid hemorrhage, unspecified: Secondary | ICD-10-CM

## 2021-08-01 ENCOUNTER — Other Ambulatory Visit (HOSPITAL_COMMUNITY): Payer: Medicare Other

## 2021-08-04 ENCOUNTER — Other Ambulatory Visit (HOSPITAL_COMMUNITY): Payer: Medicare Other

## 2021-08-17 ENCOUNTER — Other Ambulatory Visit: Payer: Self-pay | Admitting: Neurosurgery

## 2021-09-05 ENCOUNTER — Other Ambulatory Visit: Payer: Self-pay | Admitting: Neurosurgery

## 2021-09-05 ENCOUNTER — Encounter (HOSPITAL_COMMUNITY): Payer: Self-pay

## 2021-09-05 ENCOUNTER — Observation Stay (HOSPITAL_COMMUNITY)
Admission: RE | Admit: 2021-09-05 | Discharge: 2021-09-06 | Disposition: A | Discharge status: Home / Self Care | Payer: Medicare Other | Source: Ambulatory Visit | Attending: Neurosurgery | Admitting: Neurosurgery

## 2021-09-05 ENCOUNTER — Other Ambulatory Visit: Payer: Self-pay

## 2021-09-05 DIAGNOSIS — F17210 Nicotine dependence, cigarettes, uncomplicated: Secondary | ICD-10-CM | POA: Diagnosis not present

## 2021-09-05 DIAGNOSIS — I725 Aneurysm of other precerebral arteries: Secondary | ICD-10-CM | POA: Diagnosis not present

## 2021-09-05 DIAGNOSIS — Z79899 Other long term (current) drug therapy: Secondary | ICD-10-CM | POA: Insufficient documentation

## 2021-09-05 DIAGNOSIS — I609 Nontraumatic subarachnoid hemorrhage, unspecified: Secondary | ICD-10-CM

## 2021-09-05 DIAGNOSIS — Z8522 Personal history of malignant neoplasm of nasal cavities, middle ear, and accessory sinuses: Secondary | ICD-10-CM | POA: Diagnosis not present

## 2021-09-05 DIAGNOSIS — I671 Cerebral aneurysm, nonruptured: Secondary | ICD-10-CM | POA: Diagnosis present

## 2021-09-05 DIAGNOSIS — Z7982 Long term (current) use of aspirin: Secondary | ICD-10-CM | POA: Insufficient documentation

## 2021-09-05 DIAGNOSIS — Z7902 Long term (current) use of antithrombotics/antiplatelets: Secondary | ICD-10-CM | POA: Diagnosis not present

## 2021-09-05 DIAGNOSIS — R55 Syncope and collapse: Secondary | ICD-10-CM | POA: Diagnosis not present

## 2021-09-05 DIAGNOSIS — I10 Essential (primary) hypertension: Secondary | ICD-10-CM | POA: Diagnosis not present

## 2021-09-05 HISTORY — PX: IR ANGIO VERTEBRAL SEL VERTEBRAL UNI L MOD SED: IMG5367

## 2021-09-05 HISTORY — PX: IR ANGIO INTRA EXTRACRAN SEL INTERNAL CAROTID BILAT MOD SED: IMG5363

## 2021-09-05 LAB — CBC WITH DIFFERENTIAL/PLATELET
Abs Immature Granulocytes: 0.04 10*3/uL (ref 0.00–0.07)
Basophils Absolute: 0.1 10*3/uL (ref 0.0–0.1)
Basophils Relative: 0 %
Eosinophils Absolute: 0.3 10*3/uL (ref 0.0–0.5)
Eosinophils Relative: 2 %
HCT: 41.8 % (ref 36.0–46.0)
Hemoglobin: 13.5 g/dL (ref 12.0–15.0)
Immature Granulocytes: 0 %
Lymphocytes Relative: 15 %
Lymphs Abs: 1.9 10*3/uL (ref 0.7–4.0)
MCH: 27.4 pg (ref 26.0–34.0)
MCHC: 32.3 g/dL (ref 30.0–36.0)
MCV: 85 fL (ref 80.0–100.0)
Monocytes Absolute: 0.9 10*3/uL (ref 0.1–1.0)
Monocytes Relative: 7 %
Neutro Abs: 9.6 10*3/uL — ABNORMAL HIGH (ref 1.7–7.7)
Neutrophils Relative %: 76 %
Platelets: 237 10*3/uL (ref 150–400)
RBC: 4.92 MIL/uL (ref 3.87–5.11)
RDW: 16.4 % — ABNORMAL HIGH (ref 11.5–15.5)
WBC: 12.8 10*3/uL — ABNORMAL HIGH (ref 4.0–10.5)
nRBC: 0 % (ref 0.0–0.2)

## 2021-09-05 LAB — BASIC METABOLIC PANEL
Anion gap: 8 (ref 5–15)
BUN: 16 mg/dL (ref 8–23)
CO2: 22 mmol/L (ref 22–32)
Calcium: 9.4 mg/dL (ref 8.9–10.3)
Chloride: 111 mmol/L (ref 98–111)
Creatinine, Ser: 0.79 mg/dL (ref 0.44–1.00)
GFR, Estimated: >60 mL/min (ref >60)
Glucose, Bld: 97 mg/dL (ref 70–99)
Potassium: 5.2 mmol/L — ABNORMAL HIGH (ref 3.5–5.1)
Sodium: 141 mmol/L (ref 135–145)

## 2021-09-05 LAB — URINALYSIS, ROUTINE W REFLEX MICROSCOPIC
Bilirubin Urine: NEGATIVE
Glucose, UA: NEGATIVE mg/dL
Hgb urine dipstick: NEGATIVE
Ketones, ur: NEGATIVE mg/dL
Leukocytes,Ua: NEGATIVE
Nitrite: NEGATIVE
Protein, ur: NEGATIVE mg/dL
Specific Gravity, Urine: 1.018 (ref 1.005–1.030)
pH: 5 (ref 5.0–8.0)

## 2021-09-05 LAB — PROTIME-INR
INR: 0.9 (ref 0.8–1.2)
Prothrombin Time: 12.1 seconds (ref 11.4–15.2)

## 2021-09-05 LAB — APTT: aPTT: 41 seconds — ABNORMAL HIGH (ref 24–36)

## 2021-09-05 MED ORDER — POLYETHYLENE GLYCOL 3350 17 G PO PACK
17.0000 g | PACK | Freq: Every day | ORAL | Status: DC
  Filled 2021-09-05: qty 1

## 2021-09-05 MED ORDER — BETHANECHOL CHLORIDE 10 MG PO TABS: 50.0000 mg | ORAL_TABLET | Freq: Three times a day (TID) | ORAL | Status: DC

## 2021-09-05 MED ORDER — SODIUM CHLORIDE 0.9 % IV SOLN: INTRAVENOUS | Status: DC

## 2021-09-05 MED ORDER — IOHEXOL 300 MG/ML  SOLN
100.0000 mL | Freq: Once | INTRAMUSCULAR | Status: AC | PRN
  Administered 2021-09-05: 32 mL via INTRA_ARTERIAL

## 2021-09-05 MED ORDER — HYDROCODONE-ACETAMINOPHEN 5-325 MG PO TABS: 1.0000 | ORAL_TABLET | ORAL | Status: DC | PRN

## 2021-09-05 MED ORDER — BENAZEPRIL HCL 20 MG PO TABS
20.0000 mg | ORAL_TABLET | Freq: Every day | ORAL | Status: DC
Start: 2021-09-05 — End: 2021-09-06
  Filled 2021-09-05: qty 1

## 2021-09-05 MED ORDER — MIDAZOLAM HCL 2 MG/2ML IJ SOLN
INTRAMUSCULAR | Status: AC
  Filled 2021-09-05: qty 2

## 2021-09-05 MED ORDER — HEPARIN SODIUM (PORCINE) 1000 UNIT/ML IJ SOLN
INTRAMUSCULAR | Status: AC
  Filled 2021-09-05: qty 10

## 2021-09-05 MED ORDER — AMLODIPINE BESYLATE 10 MG PO TABS
10.0000 mg | ORAL_TABLET | Freq: Every day | ORAL | Status: DC
Start: 2021-09-05 — End: 2021-09-06
  Filled 2021-09-05: qty 1

## 2021-09-05 MED ORDER — HEPARIN SODIUM (PORCINE) 1000 UNIT/ML IJ SOLN
INTRAMUSCULAR | Status: AC | PRN
  Administered 2021-09-05: 2000 [IU] via INTRAVENOUS

## 2021-09-05 MED ORDER — CHLORHEXIDINE GLUCONATE CLOTH 2 % EX PADS: 6.0000 | MEDICATED_PAD | Freq: Once | CUTANEOUS | Status: DC

## 2021-09-05 MED ORDER — CEFAZOLIN SODIUM-DEXTROSE 2-4 GM/100ML-% IV SOLN
2.0000 g | INTRAVENOUS | Status: DC
  Filled 2021-09-05: qty 100

## 2021-09-05 MED ORDER — MIDAZOLAM HCL 2 MG/2ML IJ SOLN
INTRAMUSCULAR | Status: AC | PRN
  Administered 2021-09-05: 1 mg via INTRAVENOUS

## 2021-09-05 MED ORDER — CLOPIDOGREL BISULFATE 75 MG PO TABS: 75.0000 mg | ORAL_TABLET | Freq: Every day | ORAL | Status: DC

## 2021-09-05 MED ORDER — LOPERAMIDE HCL 2 MG PO CAPS: 2.0000 mg | ORAL_CAPSULE | ORAL | Status: DC | PRN

## 2021-09-05 MED ORDER — ASPIRIN 325 MG PO TABS: 325.0000 mg | ORAL_TABLET | Freq: Every day | ORAL | Status: DC

## 2021-09-05 MED ORDER — FENTANYL CITRATE (PF) 100 MCG/2ML IJ SOLN
INTRAMUSCULAR | Status: AC | PRN
  Administered 2021-09-05: 25 ug via INTRAVENOUS

## 2021-09-05 MED ORDER — LIDOCAINE HCL 1 % IJ SOLN
INTRAMUSCULAR | Status: AC
  Filled 2021-09-05: qty 20

## 2021-09-05 MED ORDER — FENTANYL CITRATE (PF) 100 MCG/2ML IJ SOLN
INTRAMUSCULAR | Status: AC
  Filled 2021-09-05: qty 2

## 2021-09-05 NOTE — Plan of Care (Signed)
  Problem: Education: Goal: Knowledge of General Education information will improve Description Including pain rating scale, medication(s)/side effects and non-pharmacologic comfort measures Outcome: Progressing   

## 2021-09-05 NOTE — H&P (Signed)
Chief Complaint   Aneurysm  History of Present Illness  Haley Mayo is a 71 y.o. female.  Initially seen in consultation approximately 2 years ago after being transferred from an outside hospital where she was initially admitted for syncopal episode and possible pneumonia.  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  She was treated with a stent supported coil embolization.  She has made an excellent recovery.  She presents today for routine long-term follow-up angiography.  Past Medical History   Past Medical History:  Diagnosis Date   HLD (hyperlipidemia)    HTN (hypertension)    controlled   Leukocytosis    ?    Neoplasm of ear    left ear lobe   Obesity    Wears dentures    upper    Past Surgical History   Past Surgical History:  Procedure Laterality Date   ABDOMINAL HYSTERECTOMY  Age 24   COLONOSCOPY  09/2004   normal, rpt 10 yrs   EAR CYST EXCISION Left 09/27/2017   Procedure: EXCISION EAR LOBE CYST;  Surgeon: Beverly Gust, MD;  Location: [facility];  Service: ENT;  Laterality: Left;  LOCAL ONLY   IR ANGIO VERTEBRAL SEL VERTEBRAL UNI L MOD SED  09/01/2019   IR ANGIOGRAM FOLLOW UP STUDY  09/01/2019   IR ANGIOGRAM FOLLOW UP STUDY  09/01/2019   IR ANGIOGRAM FOLLOW UP STUDY  09/01/2019   IR ANGIOGRAM FOLLOW UP STUDY  09/01/2019   IR ANGIOGRAM FOLLOW UP STUDY  09/01/2019   IR IVC FILTER PLMT / S&I Burke Keels GUID/MOD SED  09/02/2019   IR NEURO EACH ADD'L AFTER BASIC UNI LEFT (MS)  09/01/2019   IR RADIOLOGIST EVAL & MGMT  12/08/2019   IR RADIOLOGIST EVAL & MGMT  02/11/2020   IR TRANSCATH/EMBOLIZ  09/01/2019   OOPHORECTOMY     RADIOLOGY WITH ANESTHESIA N/A 09/01/2019   Procedure: IR WITH ANESTHESIA;  Surgeon: Consuella Lose, MD;  Location: [facility];  Service: Radiology;  Laterality: N/A;   TOTAL VAGINAL HYSTERECTOMY  05/2001   heavy bleeding, fibroids    Social History   Social  History   Tobacco Use   Smoking status: Every Day    Packs/day: 1.00    Years: 40.00    Pack years: 40.00    Types: Cigarettes    Start date: 1973   Smokeless tobacco: Current   Tobacco comments:    Quit in 2009--restarted  Substance Use Topics   Alcohol use: No   Drug use: No    Medications   Prior to Admission medications   Medication Sig Start Date End Date Taking? Authorizing Provider  amLODipine-benazepril (LOTREL) 10-20 MG capsule Take 1 capsule by mouth daily.   Yes [provider]  loperamide (IMODIUM) 2 MG capsule Take 1 capsule (2 mg total) by mouth as needed for diarrhea or loose stools. 10/01/19  Yes Love, Ivan Anchors, PA-C  Multiple Vitamins-Minerals (EMERGEN-C VITAMIN C PO) Take 1 tablet by mouth daily.    Yes [provider]  aspirin 325 MG tablet Take 1 tablet (325 mg total) by mouth daily. 10/01/19   Love, Ivan Anchors, PA-C  bethanechol (URECHOLINE) 50 MG tablet Take 1 tablet (50 mg total) by mouth 3 (three) times daily. 10/01/19   Love, Ivan Anchors, PA-C  clopidogrel (PLAVIX) 75 MG tablet Take 1 tablet (75 mg total) by mouth daily. 10/01/19   Bary Leriche, PA-C  polyethylene  glycol (MIRALAX / GLYCOLAX) 17 g packet Take 17 g by mouth daily. 10/01/19   Love, Ivan Anchors, PA-C    Allergies  No Known Allergies  Review of Systems  ROS  Neurologic Exam  Awake, alert, oriented Memory and concentration grossly intact Speech fluent, appropriate CN grossly intact Motor exam: Upper Extremities Deltoid Bicep Tricep Grip  Right 5/5 5/5 5/5 5/5  Left 5/5 5/5 5/5 5/5   Lower Extremities IP Quad PF DF EHL  Right 5/5 5/5 5/5 5/5 5/5  Left 5/5 5/5 5/5 5/5 5/5   Sensation grossly intact to LT  Impression  - 71 y.o. 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  She is neurologically well.  Plan  -We will plan on proceeding with routine long-term follow-up diagnostic cerebral  angiogram  I have reviewed the details of the procedure as well as the expected postoperative course and recovery with the patient.  We have also discussed the associated risks, benefits, and alternatives to the angiogram.  All her questions today were answered and she provided informed consent to proceed.  Consuella Lose, MD [facility]

## 2021-09-06 DIAGNOSIS — I671 Cerebral aneurysm, nonruptured: Secondary | ICD-10-CM

## 2021-09-06 DIAGNOSIS — I725 Aneurysm of other precerebral arteries: Secondary | ICD-10-CM | POA: Diagnosis not present

## 2021-09-06 LAB — POTASSIUM: Potassium: 3.5 mmol/L (ref 3.5–5.1)

## 2021-09-06 MED ORDER — AMLODIPINE BESYLATE 10 MG PO TABS
10.0000 mg | ORAL_TABLET | Freq: Every day | ORAL | Status: DC
  Filled 2021-09-06: qty 1

## 2021-09-06 NOTE — TOC Transition Note (Signed)
Transition of Care [facility]) - CM/SW Discharge Note   Patient Details  Name: Haley Mayo MRN: [id number] Date of Birth: [birth year]  Transition of Care [facility]) CM/SW Contact:  Pollie Friar, RN Phone Number: 09/06/2021, 11:35 AM   Clinical Narrative:    Patient is discharging home with self care. Pt has no transportation home. She uses [employer]. She can not afford to pay for a lyft/ uber.  Cm has provided the bedside RN a cab voucher to get her home.    Final next level of care: Home/Self Care Barriers to Discharge: No Barriers Identified   Patient Goals and CMS Choice        Discharge Placement                       Discharge Plan and Services                                     Social Determinants of Health (SDOH) Interventions     Readmission Risk Interventions     View : No data to display.

## 2021-09-06 NOTE — Progress Notes (Signed)
Received a call from bedside RN regarding the patient wanting to restart her home oral antihypertensives tonight.  BP 121/63.  Re-ordered her home Norvasc 10 mg daily to be restarted tonight.  Home ACEi held due to hyperkalemia.  Ordered a repeat serum potassium level.  We will continue to closely monitor and treat as indicated.

## 2021-09-06 NOTE — Brief Op Note (Signed)
  NEUROSURGERY BRIEF OPERATIVE  NOTE   PREOP DX: Cerebral aneurysm  POSTOP DX: Same  PROCEDURE: Diagnostic cerebral angiogram  SURGEON: Dr. Consuella Lose, MD  ANESTHESIA: IV Sedation with Local  EBL: Minimal  SPECIMENS: None  COMPLICATIONS: None  CONDITION: Stable to recovery  FINDINGS (Full report in [facility]): 1. No residual or recurrent fusiform basilar aneurysm. No in-stent stenosis.   Consuella Lose, MD [facility]

## 2021-09-06 NOTE — Discharge Summary (Signed)
  Physician Discharge Summary  Patient ID: Haley Mayo MRN: [id number] DOB/AGE: 70-Jul-1952 70 y.o.  Admit date: 09/05/2021 Discharge date: 09/06/2021  Admission Diagnoses:  Aneurysm  Discharge Diagnoses:  Same Principal Problem:   Cerebral aneurysm   Discharged Condition: Stable  Hospital Course:  Haley Mayo is a 71 y.o. female admitted for obs after elective diagnostic angiogram. She was at baseline postop and requested d/c.  Discharge Exam: Blood pressure 132/67, pulse 75, temperature 98.1 F (36.7 C), temperature source Oral, resp. rate 16, height 5' 0.5" (1.537 m), weight 73.5 kg, SpO2 90 %. Awake, alert, oriented Speech fluent, appropriate CN grossly intact 5/5 BUE/BLE Wound c/d/i  Disposition: Discharge disposition: 01-Home or Self Care       Discharge Instructions     Call MD for:  redness, tenderness, or signs of infection (pain, swelling, redness, odor or green/yellow discharge around incision site)   Complete by: As directed    Call MD for:  temperature >100.4   Complete by: As directed    Diet - low sodium heart healthy   Complete by: As directed    Discharge instructions   Complete by: As directed    Walk at home as much as possible, at least 4 times / day   Increase activity slowly   Complete by: As directed    Lifting restrictions   Complete by: As directed    No lifting > 10 lbs   May shower / Bathe   Complete by: As directed    48 hours after surgery   May walk up steps   Complete by: As directed    No dressing needed   Complete by: As directed    Other Restrictions   Complete by: As directed    No bending/twisting at waist      Allergies as of 09/06/2021   No Known Allergies      Medication List     TAKE these medications    amLODipine-benazepril 10-20 MG capsule Commonly known as: LOTREL Take 1 capsule by mouth daily.   aspirin 325 MG tablet Take 1 tablet (325 mg total) by mouth daily. What changed:   when to take this reasons to take this   bethanechol 50 MG tablet Commonly known as: URECHOLINE Take 1 tablet (50 mg total) by mouth 3 (three) times daily.   clopidogrel 75 MG tablet Commonly known as: PLAVIX Take 1 tablet (75 mg total) by mouth daily.   EMERGEN-C VITAMIN C PO Take 1 tablet by mouth daily.   loperamide 2 MG capsule Commonly known as: IMODIUM Take 1 capsule (2 mg total) by mouth as needed for diarrhea or loose stools.   polyethylene glycol 17 g packet Commonly known as: MIRALAX / GLYCOLAX Take 17 g by mouth daily.               Discharge Care Instructions  (From admission, onward)           Start     Ordered   09/06/21 0000  No dressing needed        09/06/21 1111            Follow-up Information     Consuella Lose, MD Follow up in 12 month(s).   Specialty: Neurosurgery Contact information: [address] [place] [address] [phone]                 Signed: Jairo Ben 09/06/2021, 11:12 AM

## 2022-01-15 IMAGING — DX DG CHEST 1V PORT
1 series · 1 of 1 positions shown · non-contrast
Comparison: None.

CLINICAL DATA: Weakness.

EXAM:
PORTABLE CHEST 1 VIEW

[chest ap]
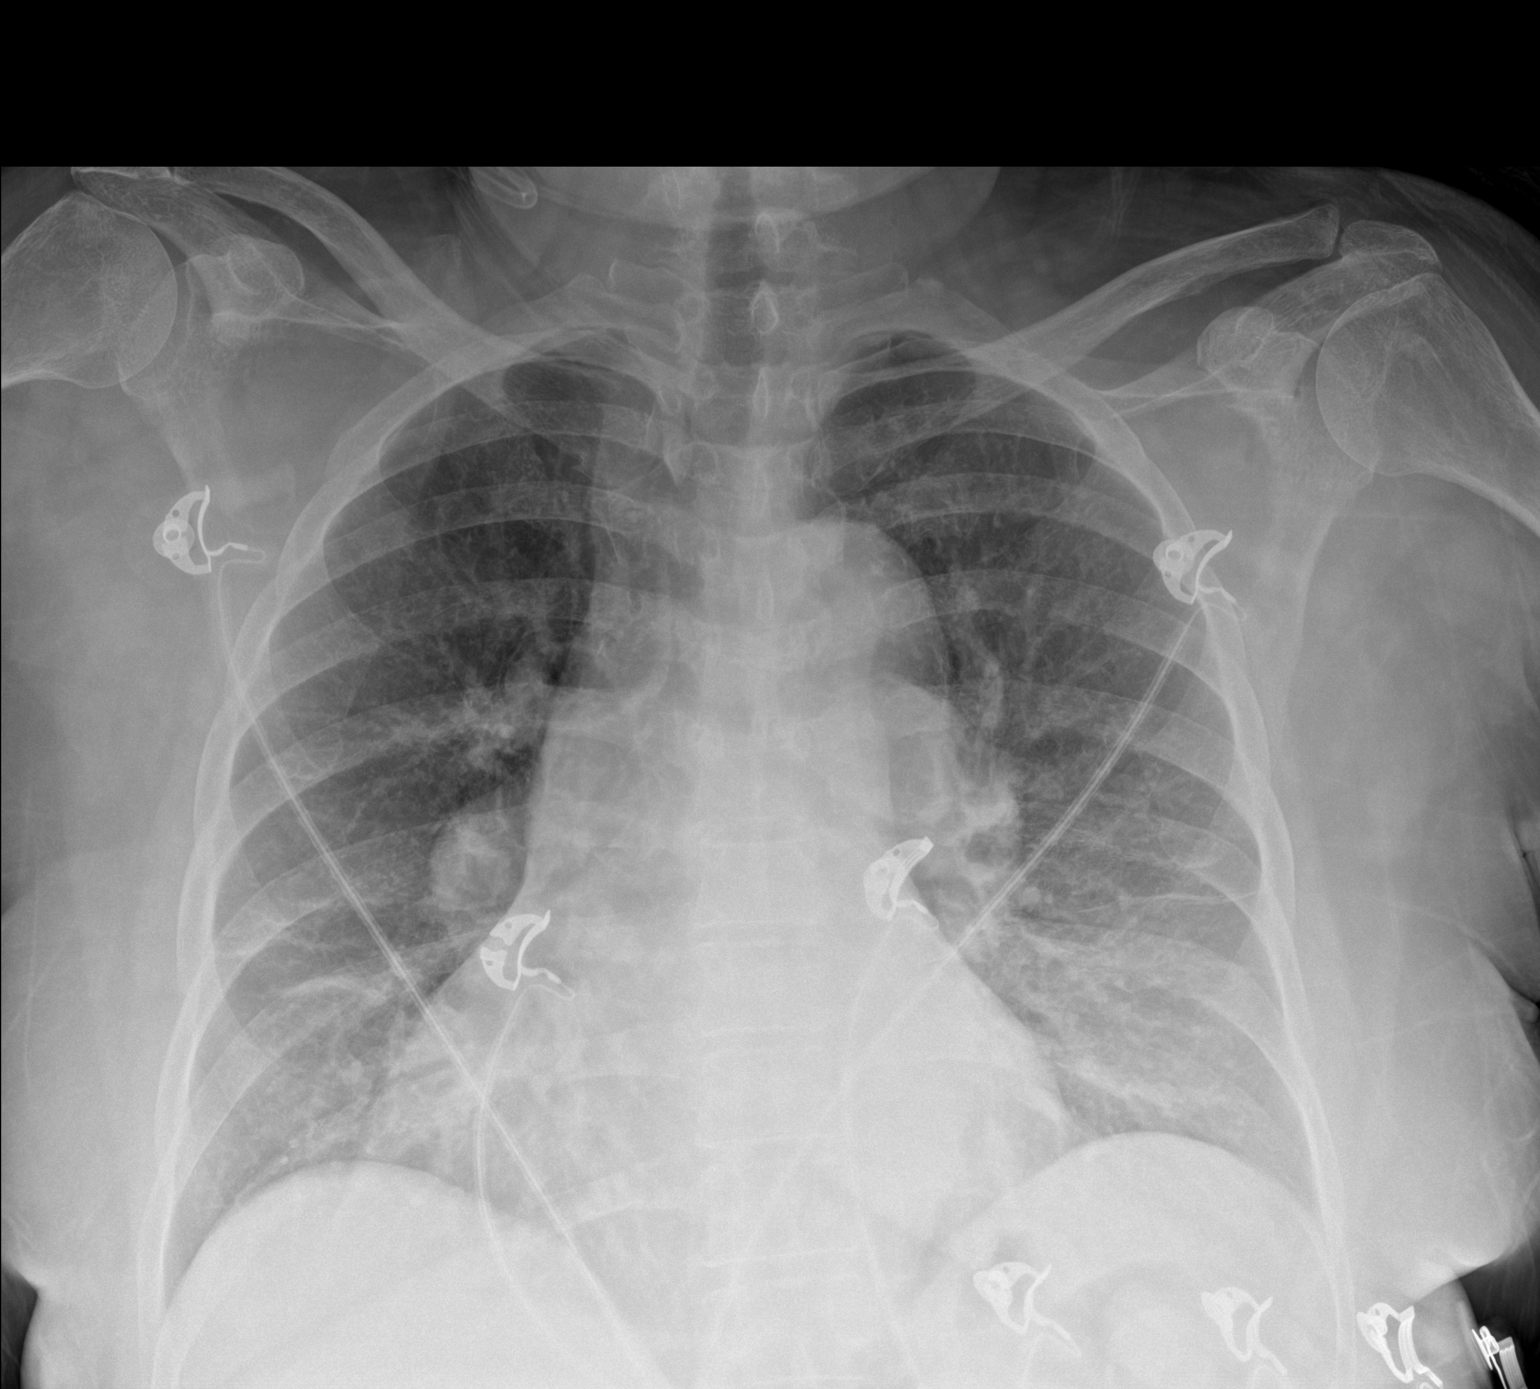

[1 of 1 positions shown; findings below may reference images not displayed]

FINDINGS: Mild atelectasis and/or infiltrate is seen within the bilateral lung
bases. There is no evidence of a pleural effusion or pneumothorax.
The cardiac silhouette is mildly enlarged. The visualized skeletal
structures are unremarkable.
IMPRESSION: Mild bibasilar atelectasis and/or infiltrate.

## 2022-01-19 IMAGING — CT CT ANGIO NECK
2 of 7 series · 10 of 33 positions shown · IV contrast (APPLIED)
Comparison: None.
COMPARISON: None.

Addendum:
CLINICAL DATA: Intracranial hemorrhage

EXAM:
CT ANGIOGRAPHY HEAD AND NECK
TECHNIQUE: Multidetector CT imaging of the head and neck was performed using
the standard protocol during bolus administration of intravenous
contrast. Multiplanar CT image reconstructions and MIPs were
obtained to evaluate the vascular anatomy. Carotid stenosis
measurements (when applicable) are obtained utilizing NASCET
criteria, using the distal internal carotid diameter as the
denominator.
CONTRAST:  75mL OMNIPAQUE IOHEXOL 350 MG/ML SOLN

[Series 10: cta head neck · axial · 0.52mm/px · z∈[-143,+111]mm · 8 of 165 slices shown]
[im 19/165  soft-tissue]
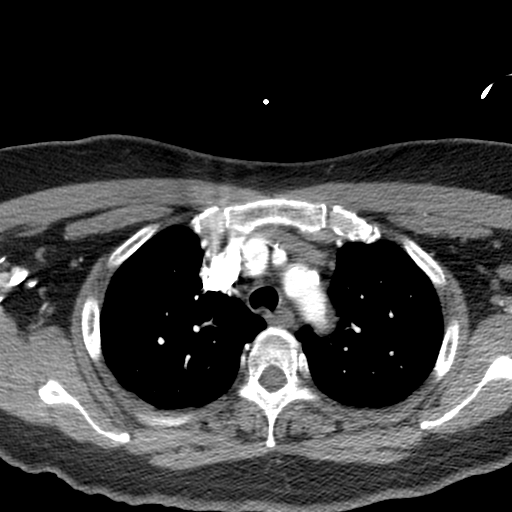
[im 37/165  bone]
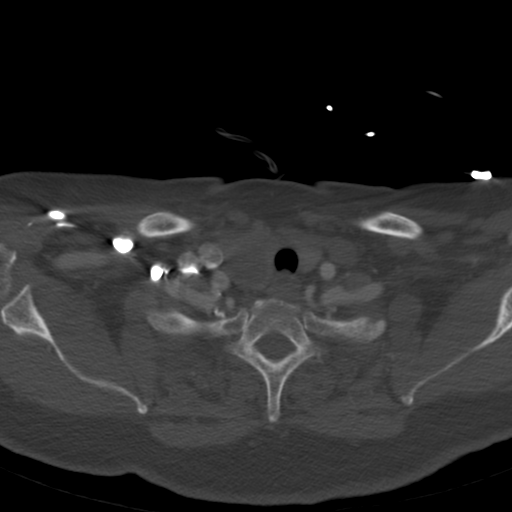
[im 55/165  soft-tissue]
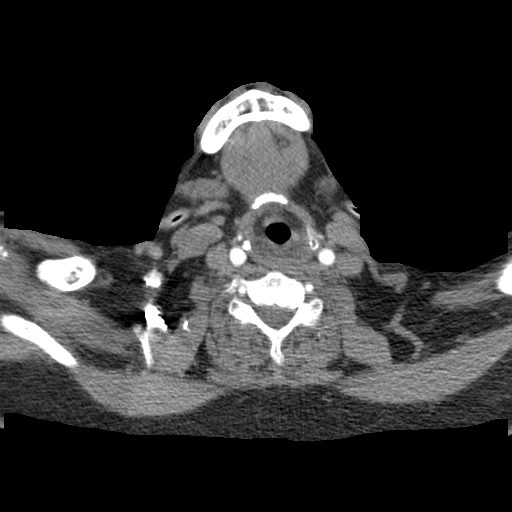
[im 73/165  bone]
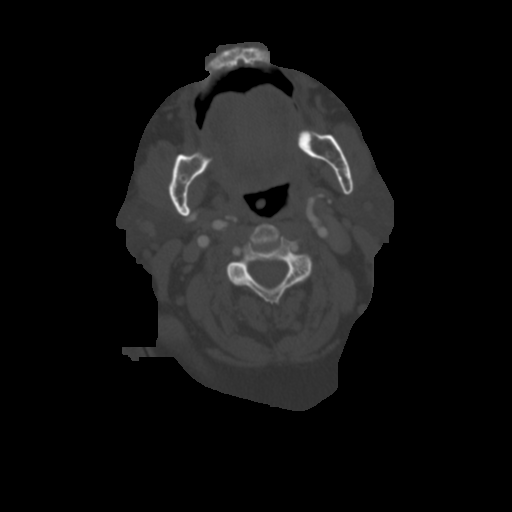
[im 92/165  soft-tissue]
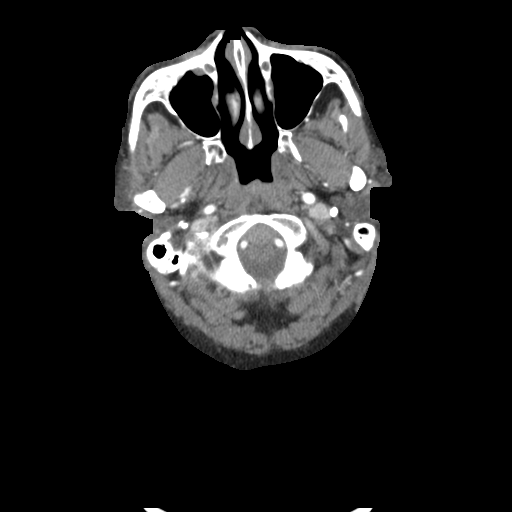
[im 110/165  bone]
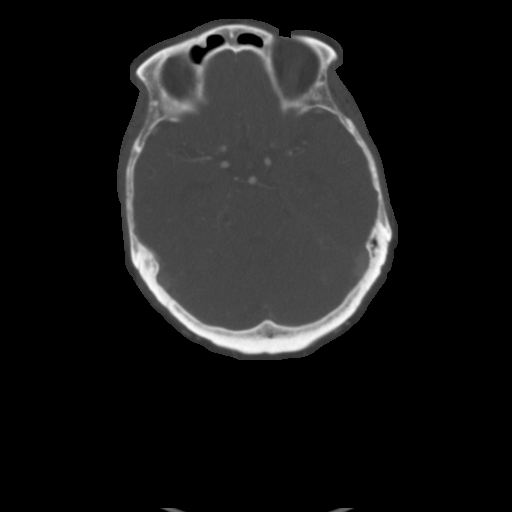
[im 128/165  soft-tissue]
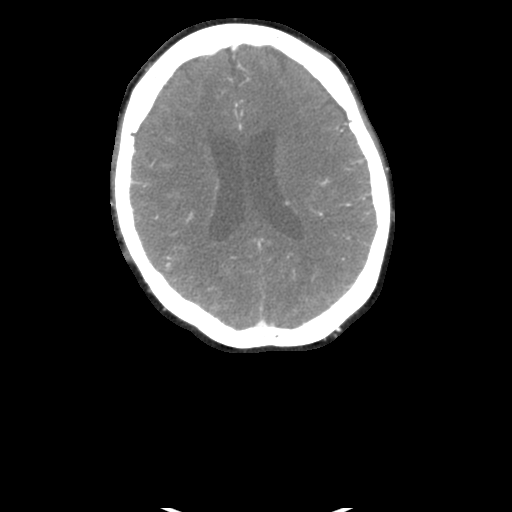
[im 146/165  bone]
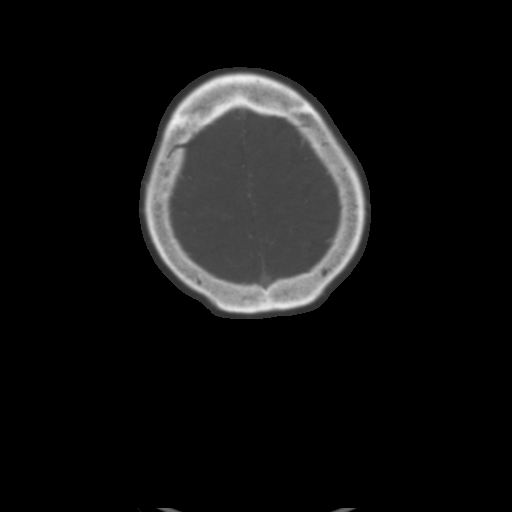

[Series 15: axial thick · axial · 0.32mm/px · z∈[-109,+4]mm · 2 of 69 slices shown]
[im 23/69  soft-tissue]
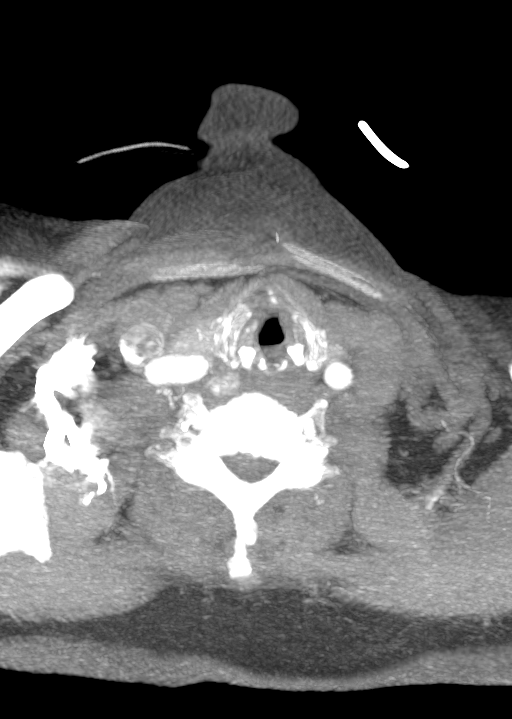
[im 46/69  soft-tissue]
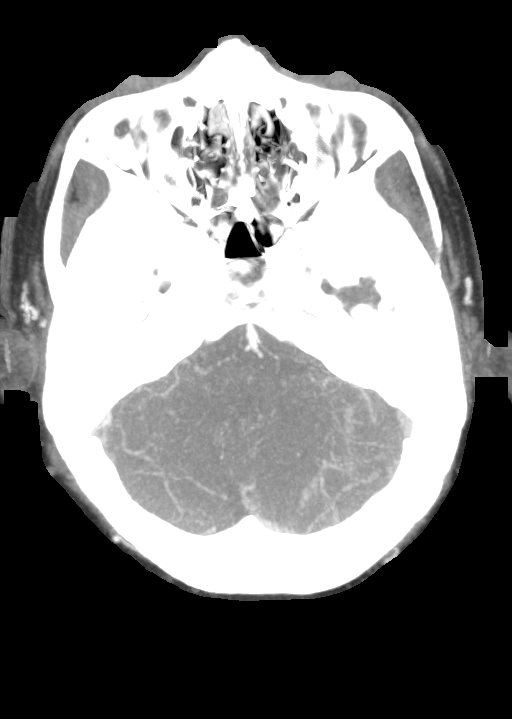

[10 of 33 positions shown; findings below may reference images not displayed]

FINDINGS: CT HEAD FINDINGS

Brain: Unchanged distribution of subarachnoid hemorrhage. No midline
shift or mass effect.

Skull: The visualized skull base, calvarium and extracranial soft
tissues are normal.

Sinuses/Orbits: No fluid levels or advanced mucosal thickening of
the visualized paranasal sinuses. No mastoid or middle ear effusion.
The orbits are normal.

CTA NECK FINDINGS

SKELETON: There is no bony spinal canal stenosis. No lytic or
blastic lesion.

OTHER NECK: 2.3 cm right thyroid nodule.

UPPER CHEST: There is an embolus within the right upper lobar
pulmonary artery.

AORTIC ARCH:

There is mild calcific atherosclerosis of the aortic arch. There is
no aneurysm, dissection or hemodynamically significant stenosis of
the visualized portion of the aorta. Conventional 3 vessel aortic
branching pattern. The visualized proximal subclavian arteries are
widely patent.

RIGHT CAROTID SYSTEM: Normal without aneurysm, dissection or
stenosis.

LEFT CAROTID SYSTEM: Normal without aneurysm, dissection or
stenosis.

VERTEBRAL ARTERIES: Codominant configuration. Both origins are
clearly patent. There is no dissection, occlusion or flow-limiting
stenosis to the skull base (V1-V3 segments).

CTA HEAD FINDINGS

POSTERIOR CIRCULATION:

--Vertebral arteries: Small rate narrowing of the proximal right V4
segments

--Inferior cerebellar arteries: Normal.

--Basilar artery: There is a 9 x 7 mm aneurysm of the tip of the
basilar artery.

--Superior cerebellar arteries: Normal.

--Posterior cerebral arteries (PCA): Normal.

ANTERIOR CIRCULATION:

--Intracranial internal carotid arteries: Normal.

--Anterior cerebral arteries (ACA): Normal. Both A1 segments are
present. Patent anterior communicating artery (a-comm).

--Middle cerebral arteries (MCA): Normal.

VENOUS SINUSES: As permitted by contrast timing, patent.

ANATOMIC VARIANTS: None

Review of the MIP images confirms the above findings.
IMPRESSION: 1. Unchanged distribution of subarachnoid hemorrhage.
2. There is a 9 x 7 mm aneurysm of the tip of the basilar artery.
3. Right upper lobar pulmonary artery embolus, incompletely
visualized.
4. Aortic Atherosclerosis (HR0KA-H8N.N).

Critical Value/emergent results were called by telephone at the time
of interpretation on 08/30/2019 at [DATE] to provider AMAZIGH
NIKY , who verbally acknowledged these results.

ADDENDUM:
2.3 cm right thyroid nodule. In the setting of significant
comorbidities or limited life expectancy, no follow-up recommended
(ref: [HOSPITAL]. [DATE]): 143-50).

*** End of Addendum ***
FINDINGS: CT HEAD FINDINGS

Brain: Unchanged distribution of subarachnoid hemorrhage. No midline
shift or mass effect.

Skull: The visualized skull base, calvarium and extracranial soft
tissues are normal.

Sinuses/Orbits: No fluid levels or advanced mucosal thickening of
the visualized paranasal sinuses. No mastoid or middle ear effusion.
The orbits are normal.

CTA NECK FINDINGS

SKELETON: There is no bony spinal canal stenosis. No lytic or
blastic lesion.

OTHER NECK: 2.3 cm right thyroid nodule.

UPPER CHEST: There is an embolus within the right upper lobar
pulmonary artery.

AORTIC ARCH:

There is mild calcific atherosclerosis of the aortic arch. There is
no aneurysm, dissection or hemodynamically significant stenosis of
the visualized portion of the aorta. Conventional 3 vessel aortic
branching pattern. The visualized proximal subclavian arteries are
widely patent.

RIGHT CAROTID SYSTEM: Normal without aneurysm, dissection or
stenosis.

LEFT CAROTID SYSTEM: Normal without aneurysm, dissection or
stenosis.

VERTEBRAL ARTERIES: Codominant configuration. Both origins are
clearly patent. There is no dissection, occlusion or flow-limiting
stenosis to the skull base (V1-V3 segments).

CTA HEAD FINDINGS

POSTERIOR CIRCULATION:

--Vertebral arteries: Small rate narrowing of the proximal right V4
segments

--Inferior cerebellar arteries: Normal.

--Basilar artery: There is a 9 x 7 mm aneurysm of the tip of the
basilar artery.

--Superior cerebellar arteries: Normal.

--Posterior cerebral arteries (PCA): Normal.

ANTERIOR CIRCULATION:

--Intracranial internal carotid arteries: Normal.

--Anterior cerebral arteries (ACA): Normal. Both A1 segments are
present. Patent anterior communicating artery (a-comm).

--Middle cerebral arteries (MCA): Normal.

VENOUS SINUSES: As permitted by contrast timing, patent.

ANATOMIC VARIANTS: None

Review of the MIP images confirms the above findings.
IMPRESSION: 1. Unchanged distribution of subarachnoid hemorrhage.
2. There is a 9 x 7 mm aneurysm of the tip of the basilar artery.
3. Right upper lobar pulmonary artery embolus, incompletely
visualized.
4. Aortic Atherosclerosis (HR0KA-H8N.N).

Critical Value/emergent results were called by telephone at the time
of interpretation on 08/30/2019 at [DATE] to provider AMAZIGH
NIKY , who verbally acknowledged these results.

## 2022-01-19 IMAGING — CT CT ANGIO HEAD
1 of 11 series · 8 of 33 positions shown · IV contrast (omnipaque)
Comparison: None.
COMPARISON: None.

Addendum:
CLINICAL DATA: Intracranial hemorrhage

EXAM:
CT ANGIOGRAPHY HEAD AND NECK
TECHNIQUE: Multidetector CT imaging of the head and neck was performed using
the standard protocol during bolus administration of intravenous
contrast. Multiplanar CT image reconstructions and MIPs were
obtained to evaluate the vascular anatomy. Carotid stenosis
measurements (when applicable) are obtained utilizing NASCET
criteria, using the distal internal carotid diameter as the
denominator.
CONTRAST:  75mL OMNIPAQUE IOHEXOL 350 MG/ML SOLN

[Series 11: cta head neck thins · axial · 0.32mm/px · z∈[-180,+79]mm · 8 of 680 slices shown]
[im 76/680  soft-tissue]
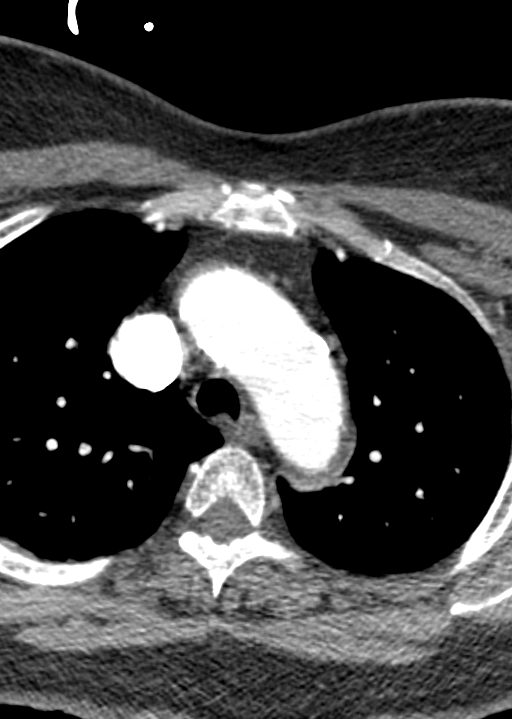
[im 151/680  bone]
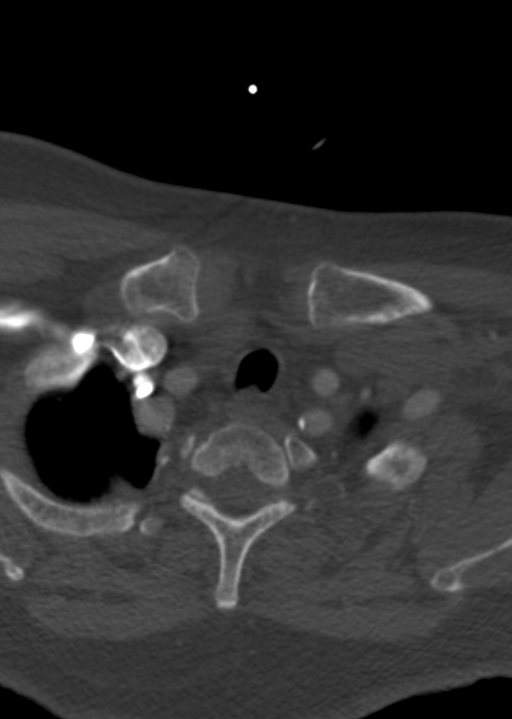
[im 227/680  soft-tissue]
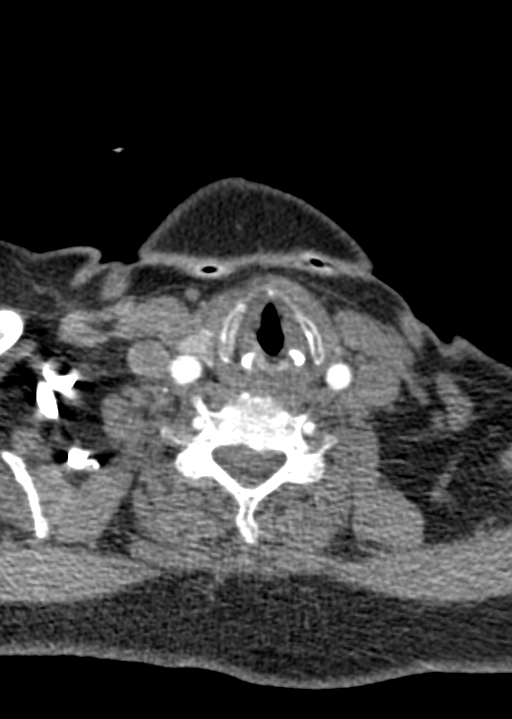
[im 302/680  bone]
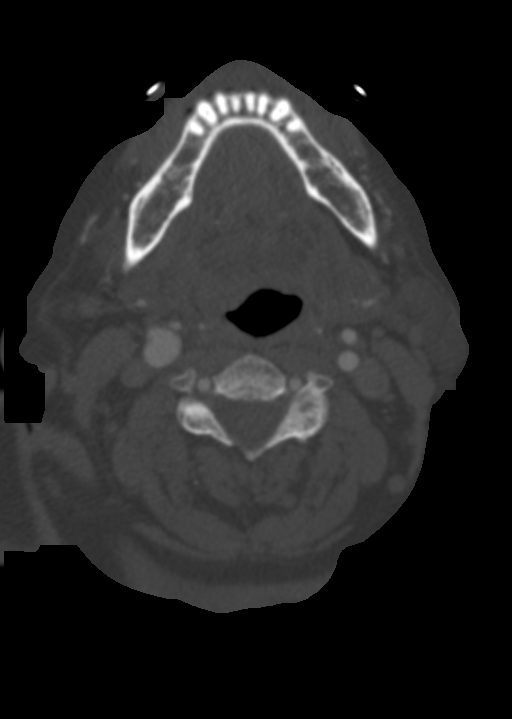
[im 378/680  soft-tissue]
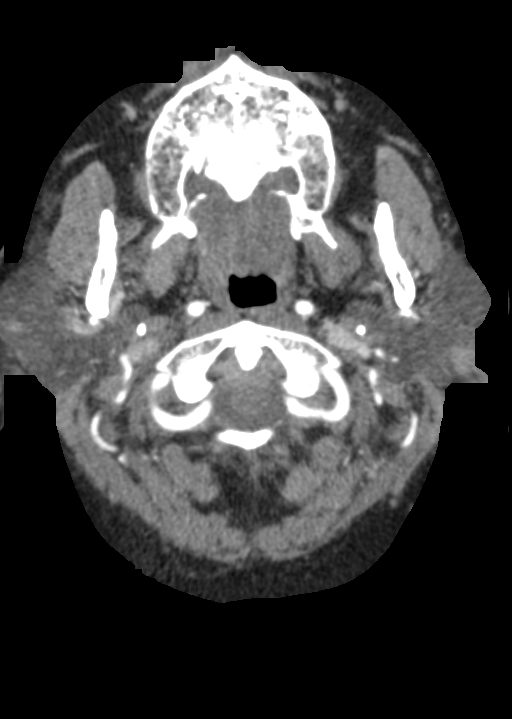
[im 453/680  bone]
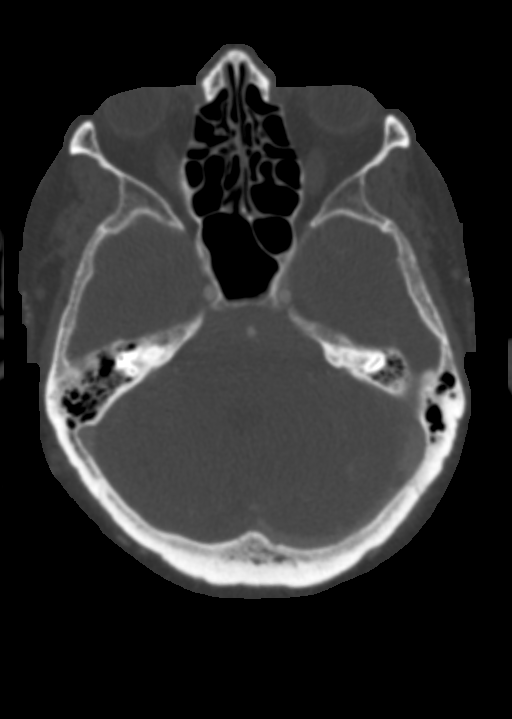
[im 529/680  soft-tissue]
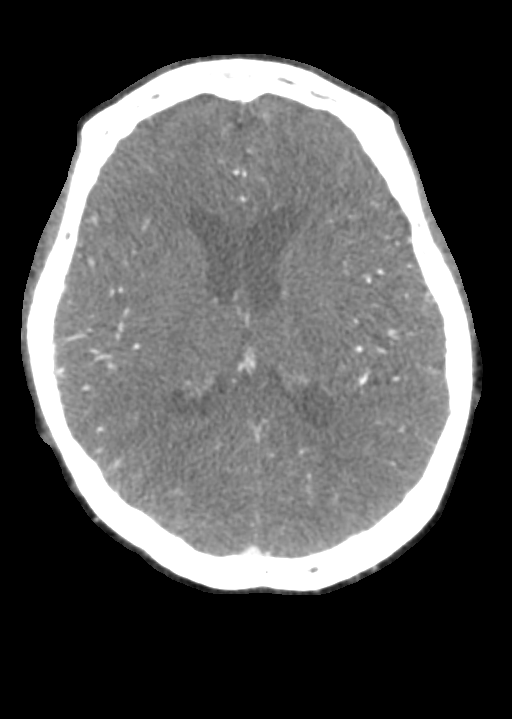
[im 604/680  bone]
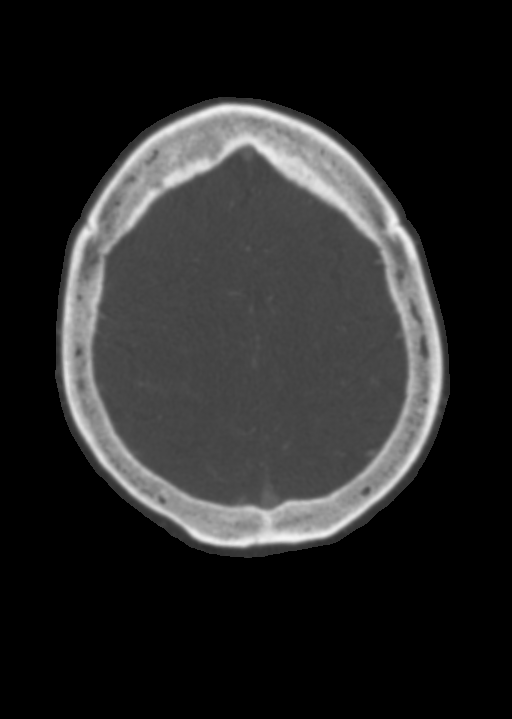

[8 of 33 positions shown; findings below may reference images not displayed]

FINDINGS: CT HEAD FINDINGS

Brain: Unchanged distribution of subarachnoid hemorrhage. No midline
shift or mass effect.

Skull: The visualized skull base, calvarium and extracranial soft
tissues are normal.

Sinuses/Orbits: No fluid levels or advanced mucosal thickening of
the visualized paranasal sinuses. No mastoid or middle ear effusion.
The orbits are normal.

CTA NECK FINDINGS

SKELETON: There is no bony spinal canal stenosis. No lytic or
blastic lesion.

OTHER NECK: 2.3 cm right thyroid nodule.

UPPER CHEST: There is an embolus within the right upper lobar
pulmonary artery.

AORTIC ARCH:

There is mild calcific atherosclerosis of the aortic arch. There is
no aneurysm, dissection or hemodynamically significant stenosis of
the visualized portion of the aorta. Conventional 3 vessel aortic
branching pattern. The visualized proximal subclavian arteries are
widely patent.

RIGHT CAROTID SYSTEM: Normal without aneurysm, dissection or
stenosis.

LEFT CAROTID SYSTEM: Normal without aneurysm, dissection or
stenosis.

VERTEBRAL ARTERIES: Codominant configuration. Both origins are
clearly patent. There is no dissection, occlusion or flow-limiting
stenosis to the skull base (V1-V3 segments).

CTA HEAD FINDINGS

POSTERIOR CIRCULATION:

--Vertebral arteries: Small rate narrowing of the proximal right V4
segments

--Inferior cerebellar arteries: Normal.

--Basilar artery: There is a 9 x 7 mm aneurysm of the tip of the
basilar artery.

--Superior cerebellar arteries: Normal.

--Posterior cerebral arteries (PCA): Normal.

ANTERIOR CIRCULATION:

--Intracranial internal carotid arteries: Normal.

--Anterior cerebral arteries (ACA): Normal. Both A1 segments are
present. Patent anterior communicating artery (a-comm).

--Middle cerebral arteries (MCA): Normal.

VENOUS SINUSES: As permitted by contrast timing, patent.

ANATOMIC VARIANTS: None

Review of the MIP images confirms the above findings.
IMPRESSION: 1. Unchanged distribution of subarachnoid hemorrhage.
2. There is a 9 x 7 mm aneurysm of the tip of the basilar artery.
3. Right upper lobar pulmonary artery embolus, incompletely
visualized.
4. Aortic Atherosclerosis (HR0KA-[id number].N).

Critical Value/emergent results were called by telephone at the time
of interpretation on 08/30/2019 at [DATE] to provider AMAZIGH
NIKY , who verbally acknowledged these results.

ADDENDUM:
2.3 cm right thyroid nodule. In the setting of significant
comorbidities or limited life expectancy, no follow-up recommended
(ref: [HOSPITAL]. [DATE]): 143-50).

*** End of Addendum ***
FINDINGS: CT HEAD FINDINGS

Brain: Unchanged distribution of subarachnoid hemorrhage. No midline
shift or mass effect.

Skull: The visualized skull base, calvarium and extracranial soft
tissues are normal.

Sinuses/Orbits: No fluid levels or advanced mucosal thickening of
the visualized paranasal sinuses. No mastoid or middle ear effusion.
The orbits are normal.

CTA NECK FINDINGS

SKELETON: There is no bony spinal canal stenosis. No lytic or
blastic lesion.

OTHER NECK: 2.3 cm right thyroid nodule.

UPPER CHEST: There is an embolus within the right upper lobar
pulmonary artery.

AORTIC ARCH:

There is mild calcific atherosclerosis of the aortic arch. There is
no aneurysm, dissection or hemodynamically significant stenosis of
the visualized portion of the aorta. Conventional 3 vessel aortic
branching pattern. The visualized proximal subclavian arteries are
widely patent.

RIGHT CAROTID SYSTEM: Normal without aneurysm, dissection or
stenosis.

LEFT CAROTID SYSTEM: Normal without aneurysm, dissection or
stenosis.

VERTEBRAL ARTERIES: Codominant configuration. Both origins are
clearly patent. There is no dissection, occlusion or flow-limiting
stenosis to the skull base (V1-V3 segments).

CTA HEAD FINDINGS

POSTERIOR CIRCULATION:

--Vertebral arteries: Small rate narrowing of the proximal right V4
segments

--Inferior cerebellar arteries: Normal.

--Basilar artery: There is a 9 x 7 mm aneurysm of the tip of the
basilar artery.

--Superior cerebellar arteries: Normal.

--Posterior cerebral arteries (PCA): Normal.

ANTERIOR CIRCULATION:

--Intracranial internal carotid arteries: Normal.

--Anterior cerebral arteries (ACA): Normal. Both A1 segments are
present. Patent anterior communicating artery (a-comm).

--Middle cerebral arteries (MCA): Normal.

VENOUS SINUSES: As permitted by contrast timing, patent.

ANATOMIC VARIANTS: None

Review of the MIP images confirms the above findings.
IMPRESSION: 1. Unchanged distribution of subarachnoid hemorrhage.
2. There is a 9 x 7 mm aneurysm of the tip of the basilar artery.
3. Right upper lobar pulmonary artery embolus, incompletely
visualized.
4. Aortic Atherosclerosis (HR0KA-H8N.N).

Critical Value/emergent results were called by telephone at the time
of interpretation on 08/30/2019 at [DATE] to provider AMAZIGH
NIKY , who verbally acknowledged these results.

## 2024-01-08 NOTE — Telephone Encounter (Signed)
 Per patient order for US  FNA was sent to [facility] in [place]. Per [facility] at [facility] that got the order, pt needs medical clearance to have procedure done for her to come off her Plavix  for 5 days before it can be done. I found the provider that is doing her after care of stroke and order for Plavix  is Dr. Buel Jewels at [facility] in [place] P: [phone], [phone]. A letter asking for clearance has been sent and was ask to fax it to [facility] at [facility]

## 2024-01-08 NOTE — Telephone Encounter (Signed)
 Spoke w/pt today since I saw she had never scheduled US  FNA of thyroid from visit in Oct. 2021. She asked if that could be done at [facility] In [place] since that is much closer to where she lives. I confirmed that it can be done there and put in new order.I call pt back to let her know.
